# Patient Record
Sex: Male | Born: 1965 | Race: White | Hispanic: No | Marital: Single | Smoking: Never smoker
Health system: Southern US, Community
[De-identification: ages and names within clinical notes are randomized; demographics above are authoritative.]

## PROBLEM LIST (undated history)

## (undated) DIAGNOSIS — I10 Essential (primary) hypertension: Secondary | ICD-10-CM

## (undated) DIAGNOSIS — C801 Malignant (primary) neoplasm, unspecified: Secondary | ICD-10-CM

## (undated) HISTORY — PX: NO PAST SURGERIES: SHX2092

---

## 2019-11-03 ENCOUNTER — Other Ambulatory Visit: Payer: Self-pay | Admitting: Oncology

## 2019-11-03 ENCOUNTER — Other Ambulatory Visit: Payer: Self-pay

## 2019-11-03 ENCOUNTER — Emergency Department (HOSPITAL_COMMUNITY): Payer: Managed Care, Other (non HMO)

## 2019-11-03 ENCOUNTER — Encounter (HOSPITAL_COMMUNITY): Payer: Self-pay | Admitting: *Deleted

## 2019-11-03 ENCOUNTER — Inpatient Hospital Stay (HOSPITAL_COMMUNITY)
Admission: EM | Admit: 2019-11-03 | Discharge: 2019-11-06 | DRG: 543 | Disposition: A | Discharge status: Home / Self Care | Payer: Managed Care, Other (non HMO) | Attending: Internal Medicine | Admitting: Internal Medicine

## 2019-11-03 DIAGNOSIS — Z87891 Personal history of nicotine dependence: Secondary | ICD-10-CM | POA: Diagnosis not present

## 2019-11-03 DIAGNOSIS — D72829 Elevated white blood cell count, unspecified: Secondary | ICD-10-CM | POA: Diagnosis not present

## 2019-11-03 DIAGNOSIS — Z7982 Long term (current) use of aspirin: Secondary | ICD-10-CM | POA: Diagnosis not present

## 2019-11-03 DIAGNOSIS — D509 Iron deficiency anemia, unspecified: Secondary | ICD-10-CM | POA: Diagnosis present

## 2019-11-03 DIAGNOSIS — C7951 Secondary malignant neoplasm of bone: Secondary | ICD-10-CM | POA: Diagnosis not present

## 2019-11-03 DIAGNOSIS — I10 Essential (primary) hypertension: Secondary | ICD-10-CM | POA: Diagnosis present

## 2019-11-03 DIAGNOSIS — Z79899 Other long term (current) drug therapy: Secondary | ICD-10-CM | POA: Diagnosis not present

## 2019-11-03 DIAGNOSIS — Z20828 Contact with and (suspected) exposure to other viral communicable diseases: Secondary | ICD-10-CM | POA: Diagnosis present

## 2019-11-03 DIAGNOSIS — R509 Fever, unspecified: Secondary | ICD-10-CM | POA: Diagnosis not present

## 2019-11-03 DIAGNOSIS — E785 Hyperlipidemia, unspecified: Secondary | ICD-10-CM | POA: Diagnosis present

## 2019-11-03 DIAGNOSIS — C252 Malignant neoplasm of tail of pancreas: Secondary | ICD-10-CM | POA: Diagnosis present

## 2019-11-03 DIAGNOSIS — R000 Tachycardia, unspecified: Secondary | ICD-10-CM | POA: Diagnosis not present

## 2019-11-03 DIAGNOSIS — N179 Acute kidney failure, unspecified: Secondary | ICD-10-CM | POA: Diagnosis present

## 2019-11-03 DIAGNOSIS — Z808 Family history of malignant neoplasm of other organs or systems: Secondary | ICD-10-CM | POA: Diagnosis not present

## 2019-11-03 DIAGNOSIS — C787 Secondary malignant neoplasm of liver and intrahepatic bile duct: Secondary | ICD-10-CM | POA: Diagnosis present

## 2019-11-03 DIAGNOSIS — Z8249 Family history of ischemic heart disease and other diseases of the circulatory system: Secondary | ICD-10-CM | POA: Diagnosis not present

## 2019-11-03 DIAGNOSIS — K8689 Other specified diseases of pancreas: Secondary | ICD-10-CM | POA: Diagnosis present

## 2019-11-03 DIAGNOSIS — C799 Secondary malignant neoplasm of unspecified site: Secondary | ICD-10-CM

## 2019-11-03 DIAGNOSIS — M8448XA Pathological fracture, other site, initial encounter for fracture: Secondary | ICD-10-CM | POA: Diagnosis not present

## 2019-11-03 DIAGNOSIS — D649 Anemia, unspecified: Secondary | ICD-10-CM

## 2019-11-03 DIAGNOSIS — M8458XA Pathological fracture in neoplastic disease, other specified site, initial encounter for fracture: Principal | ICD-10-CM | POA: Diagnosis present

## 2019-11-03 DIAGNOSIS — S22080A Wedge compression fracture of T11-T12 vertebra, initial encounter for closed fracture: Secondary | ICD-10-CM

## 2019-11-03 DIAGNOSIS — C801 Malignant (primary) neoplasm, unspecified: Secondary | ICD-10-CM

## 2019-11-03 HISTORY — DX: Essential (primary) hypertension: I10

## 2019-11-03 LAB — CBC WITH DIFFERENTIAL/PLATELET
Abs Immature Granulocytes: 0.32 10*3/uL — ABNORMAL HIGH (ref 0.00–0.07)
Basophils Absolute: 0.1 10*3/uL (ref 0.0–0.1)
Basophils Relative: 0 %
Eosinophils Absolute: 0 10*3/uL (ref 0.0–0.5)
Eosinophils Relative: 0 %
HCT: 35.8 % — ABNORMAL LOW (ref 39.0–52.0)
Hemoglobin: 10.4 g/dL — ABNORMAL LOW (ref 13.0–17.0)
Immature Granulocytes: 1 %
Lymphocytes Relative: 5 %
Lymphs Abs: 1.6 10*3/uL (ref 0.7–4.0)
MCH: 20.4 pg — ABNORMAL LOW (ref 26.0–34.0)
MCHC: 29.1 g/dL — ABNORMAL LOW (ref 30.0–36.0)
MCV: 70.1 fL — ABNORMAL LOW (ref 80.0–100.0)
Monocytes Absolute: 1.9 10*3/uL — ABNORMAL HIGH (ref 0.1–1.0)
Monocytes Relative: 6 %
Neutro Abs: 30 10*3/uL — ABNORMAL HIGH (ref 1.7–7.7)
Neutrophils Relative %: 88 %
Platelets: 566 10*3/uL — ABNORMAL HIGH (ref 150–400)
RBC: 5.11 MIL/uL (ref 4.22–5.81)
RDW: 21.1 % — ABNORMAL HIGH (ref 11.5–15.5)
WBC: 34 10*3/uL — ABNORMAL HIGH (ref 4.0–10.5)
nRBC: 0 % (ref 0.0–0.2)

## 2019-11-03 LAB — COMPREHENSIVE METABOLIC PANEL
ALT: 32 U/L (ref 0–44)
ALT: 26 U/L (ref 0–44)
AST: 35 U/L (ref 15–41)
AST: 28 U/L (ref 15–41)
Albumin: 2.9 g/dL — ABNORMAL LOW (ref 3.5–5.0)
Albumin: 2.3 g/dL — ABNORMAL LOW (ref 3.5–5.0)
Alkaline Phosphatase: 179 U/L — ABNORMAL HIGH (ref 38–126)
Alkaline Phosphatase: 137 U/L — ABNORMAL HIGH (ref 38–126)
Anion gap: 11 (ref 5–15)
Anion gap: 8 (ref 5–15)
BUN: 46 mg/dL — ABNORMAL HIGH (ref 6–20)
BUN: 38 mg/dL — ABNORMAL HIGH (ref 6–20)
CO2: 27 mmol/L (ref 22–32)
CO2: 23 mmol/L (ref 22–32)
Calcium: 13.8 mg/dL (ref 8.9–10.3)
Calcium: 12.4 mg/dL — ABNORMAL HIGH (ref 8.9–10.3)
Chloride: 100 mmol/L (ref 98–111)
Chloride: 105 mmol/L (ref 98–111)
Creatinine, Ser: 1.59 mg/dL — ABNORMAL HIGH (ref 0.61–1.24)
Creatinine, Ser: 1.57 mg/dL — ABNORMAL HIGH (ref 0.61–1.24)
GFR calc Af Amer: 56 mL/min — ABNORMAL LOW (ref >60)
GFR calc Af Amer: 57 mL/min — ABNORMAL LOW (ref >60)
GFR calc non Af Amer: 48 mL/min — ABNORMAL LOW (ref >60)
GFR calc non Af Amer: 49 mL/min — ABNORMAL LOW (ref >60)
Glucose, Bld: 153 mg/dL — ABNORMAL HIGH (ref 70–99)
Glucose, Bld: 246 mg/dL — ABNORMAL HIGH (ref 70–99)
Potassium: 4.2 mmol/L (ref 3.5–5.1)
Potassium: 4 mmol/L (ref 3.5–5.1)
Sodium: 138 mmol/L (ref 135–145)
Sodium: 136 mmol/L (ref 135–145)
Total Bilirubin: 0.6 mg/dL (ref 0.3–1.2)
Total Bilirubin: 0.7 mg/dL (ref 0.3–1.2)
Total Protein: 7.1 g/dL (ref 6.5–8.1)
Total Protein: 5.3 g/dL — ABNORMAL LOW (ref 6.5–8.1)

## 2019-11-03 LAB — URINALYSIS, ROUTINE W REFLEX MICROSCOPIC
Bilirubin Urine: NEGATIVE
Glucose, UA: NEGATIVE mg/dL
Hgb urine dipstick: NEGATIVE
Ketones, ur: NEGATIVE mg/dL
Leukocytes,Ua: NEGATIVE
Nitrite: NEGATIVE
Protein, ur: NEGATIVE mg/dL
Specific Gravity, Urine: 1.011 (ref 1.005–1.030)
pH: 7 (ref 5.0–8.0)

## 2019-11-03 LAB — T4, FREE: Free T4: 1.05 ng/dL (ref 0.61–1.12)

## 2019-11-03 LAB — SARS CORONAVIRUS 2 (TAT 6-24 HRS): SARS Coronavirus 2: NEGATIVE

## 2019-11-03 LAB — TSH: TSH: 2.281 u[IU]/mL (ref 0.350–4.500)

## 2019-11-03 LAB — HIV ANTIBODY (ROUTINE TESTING W REFLEX): HIV Screen 4th Generation wRfx: NONREACTIVE

## 2019-11-03 MED ORDER — SODIUM CHLORIDE 0.9 % IV BOLUS: 1000.0000 mL | Freq: Once | INTRAVENOUS | Status: DC

## 2019-11-03 MED ORDER — ENOXAPARIN SODIUM 40 MG/0.4ML ~~LOC~~ SOLN
40.0000 mg | SUBCUTANEOUS | Status: DC
  Administered 2019-11-03: 40 mg via SUBCUTANEOUS
  Filled 2019-11-03: qty 0.4

## 2019-11-03 MED ORDER — SODIUM CHLORIDE 0.9 % IV BOLUS
1000.0000 mL | Freq: Once | INTRAVENOUS | Status: AC
  Administered 2019-11-03: 1000 mL via INTRAVENOUS

## 2019-11-03 MED ORDER — HYDROCODONE-ACETAMINOPHEN 5-325 MG PO TABS
1.0000 | ORAL_TABLET | Freq: Four times a day (QID) | ORAL | Status: DC | PRN
  Administered 2019-11-04 – 2019-11-05 (×2): 1 via ORAL
  Filled 2019-11-03 (×2): qty 1

## 2019-11-03 MED ORDER — ONDANSETRON HCL 4 MG/2ML IJ SOLN
4.0000 mg | Freq: Four times a day (QID) | INTRAMUSCULAR | Status: DC | PRN
  Administered 2019-11-03: 4 mg via INTRAVENOUS
  Filled 2019-11-03 (×2): qty 2

## 2019-11-03 MED ORDER — SODIUM CHLORIDE 0.9 % IV SOLN: INTRAVENOUS | Status: DC

## 2019-11-03 MED ORDER — ASPIRIN EC 81 MG PO TBEC
81.0000 mg | DELAYED_RELEASE_TABLET | Freq: Every day | ORAL | Status: DC
  Administered 2019-11-03 – 2019-11-06 (×4): 81 mg via ORAL
  Filled 2019-11-03 (×4): qty 1

## 2019-11-03 MED ORDER — FENOFIBRATE 54 MG PO TABS
54.0000 mg | ORAL_TABLET | Freq: Every day | ORAL | Status: DC
  Administered 2019-11-03 – 2019-11-06 (×4): 54 mg via ORAL
  Filled 2019-11-03 (×5): qty 1

## 2019-11-03 MED ORDER — IOHEXOL 300 MG/ML  SOLN
80.0000 mL | Freq: Once | INTRAMUSCULAR | Status: AC | PRN
  Administered 2019-11-03: 14:00:00 80 mL via INTRAVENOUS

## 2019-11-03 MED ORDER — ENOXAPARIN SODIUM 40 MG/0.4ML ~~LOC~~ SOLN: 40.0000 mg | SUBCUTANEOUS | Status: DC

## 2019-11-03 MED ORDER — SODIUM CHLORIDE 0.9 % IV SOLN: INTRAVENOUS | Status: AC

## 2019-11-03 MED ORDER — ZOLEDRONIC ACID 4 MG/5ML IV CONC
3.5000 mg | Freq: Once | INTRAVENOUS | Status: AC
  Administered 2019-11-03: 3.5 mg via INTRAVENOUS
  Filled 2019-11-03: qty 4.38

## 2019-11-03 MED ORDER — MORPHINE SULFATE (PF) 4 MG/ML IV SOLN
4.0000 mg | INTRAVENOUS | Status: DC | PRN
  Administered 2019-11-03: 4 mg via INTRAVENOUS
  Filled 2019-11-03: qty 1

## 2019-11-03 MED ORDER — PRAVASTATIN SODIUM 40 MG PO TABS
40.0000 mg | ORAL_TABLET | Freq: Every day | ORAL | Status: DC
  Administered 2019-11-03 – 2019-11-06 (×4): 40 mg via ORAL
  Filled 2019-11-03 (×4): qty 1

## 2019-11-03 NOTE — ED Notes (Signed)
Called lab to check on parathyroid hormone that is not in process, although collected at 0826. Kenneth Khan in lab advises she will take care of it.

## 2019-11-03 NOTE — ED Notes (Signed)
Patient returned from CT

## 2019-11-03 NOTE — ED Triage Notes (Signed)
The pt was seen by his doctor yesterday a for back pain andhe had labwork drawn.  At 0500am today he was called at home and was told he had abnormal labs and to come to the ed.  Alert oriented skin warm and dry  No distress he took a vicodin at 0115 today  Pain at present is minimal

## 2019-11-03 NOTE — H&P (Signed)
Date: 11/03/2019               Patient Name:  Kenneth Khan. MRN: [id number]  DOB: Jul 10, 1966 Age / Sex: 53 y.o., male   PCP: Nicholes Rough, PA-C         Medical Service: Internal Medicine Teaching Service         Attending Physician: Dr. Rebeca Alert, Raynaldo Opitz, MD    First Contact: Madilyn Fireman, MD, Lyndee Leo Pager: CL [phone])  Second Contact: Eileen Stanford, MD, Obed Pager: OA [phone])       After Hours (After 5p/  First Contact Pager: [phone]  weekends / holidays): Second Contact Pager: [phone]   Chief Complaint: lower back pain   History of Present Illness: Kenneth Khan is a  53 y.o. male w/ PMHx of hypertension and hyperlipidemia presenting with right lower back pain for few weeks duration. He initially noted the pain after lifting his garage door when the spring broke and had initially treated it with tylenol and prednisone that was prescribed by an urgent care physician. He reports no significant relief and his back pain progressively worsened. He notes that it is exacerbated with standing, movement and sitting and progressively migrated along the belt line to the right lower back. He went to his PCP yesterday for evaluation of his pain and his annual check up. He was given Norco and a muscle relaxant which provided mild relief of his back pain. Labs were drawn by the PCP's office and he was noted to have abnormal white count and calcium and instructed to present to the hospital.  In the last couple of months, he has experienced fatigue which is usually worse at the end of the day. He states that he has lost about 20 pounds this year which he attributes to the [place] pandemic as he has made some dietary changes. He denies fevers, chills, night sweats, abdominal pain, diarrhea, constipation, chest pain, shortness of breath, myalgias, headaches, or vision abnormalities.   ED course:  Patient presented on recommendation of PCP for concerns of abnormal calcium levels and elevated  white count. Labs significant for Calcium 13.8, Alkaline phosphatase 179 and Cr 1.59. CBC with anemia and leukocytosis (WBC 34K). X-ray spine notable for T12 compression fracture. CT thoracic and lumbar spine with T1 and T12 lesions and concerns for pancreatic/ehpatic metastatic disease. Heme/onc consulted who recommended for CT chest/abdomen/pelvis and additional labs. Patient admitted to internal medicine for further evaluation and management.     Meds:  Current Meds  Medication Sig  . aspirin EC 81 MG tablet Take 81 mg by mouth daily.  . fenofibrate 54 MG tablet Take 54 mg by mouth daily.  Marland Kitchen HYDROcodone-acetaminophen (NORCO/VICODIN) 5-325 MG tablet Take 1 tablet by mouth every 6 (six) hours as needed for moderate pain.  Marland Kitchen lisinopril-hydrochlorothiazide (ZESTORETIC) 20-12.5 MG tablet Take 1 tablet by mouth daily.  . methocarbamol (ROBAXIN) 750 MG tablet Take 750 mg by mouth 3 (three) times daily.  . pravastatin (PRAVACHOL) 40 MG tablet Take 40 mg by mouth daily.  . predniSONE (STERAPRED UNI-PAK 21 TAB) 10 MG (21) TBPK tablet Take 10 mg by mouth See admin instructions. Started on 10-30-19 for 7 day supply. Pt's on  day 5 of therapy.     Allergies: Allergies as of 11/03/2019  . (No Known Allergies)   Past Medical History:  Diagnosis Date  . Hypertension     Family History:  Father: deceased from brain cancer; history of hypertension, hyperlipidemia and  coronary artery disease Mother: alive, 20+ years old, healthy No siblings   Social History:  Social History   Tobacco Use  . Smoking status: Never Smoker  . Smokeless tobacco: Never Used  Substance Use Topics  . Alcohol use: Yes  . 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 He lives with his fiance and her two children which are there during the weekend. He reports a 1.54yr history of smoking less than half pack per day and quit in January. He notes that he occasionally drinks wine (once every  couple weeks). He denies any illicit drug use and does not take any over the counter supplements.   Review of Systems: A complete ROS was negative except as per HPI.   Physical Exam: Blood pressure 119/62, pulse 88, temperature 98 F (36.7 C), temperature source Oral, resp. rate (!) 21, height 5\' 10"  (1.778 m), weight 83.9 kg, SpO2 99 %. Physical Exam Vitals and nursing note reviewed.  Constitutional:      General: He is not in acute distress.    Appearance: Normal appearance. He is not ill-appearing or diaphoretic.  HENT:     Head: Normocephalic and atraumatic.     Mouth/Throat:     Mouth: Mucous membranes are moist.     Pharynx: Oropharynx is clear. No oropharyngeal exudate or posterior oropharyngeal erythema.  Eyes:     General: No scleral icterus.    Extraocular Movements: Extraocular movements intact.     Comments: Pale conjunctivae  Cardiovascular:     Rate and Rhythm: Regular rhythm. Tachycardia present.     Pulses: Normal pulses.     Heart sounds: Normal heart sounds. No murmur. No friction rub. No gallop.   Pulmonary:     Effort: Pulmonary effort is normal. No respiratory distress.     Breath sounds: Normal breath sounds. No stridor. No wheezing, rhonchi or rales.  Chest:     Chest wall: No tenderness.  Abdominal:     General: Bowel sounds are normal. There is no distension.     Palpations: Abdomen is soft. There is no mass.     Tenderness: There is no abdominal tenderness. There is no guarding.  Musculoskeletal:        General: No swelling, deformity or signs of injury. Normal range of motion.     Cervical back: Normal range of motion and neck supple.  Skin:    General: Skin is warm and dry.     Capillary Refill: Capillary refill takes less than 2 seconds.     Coloration: Skin is not jaundiced.     Findings: No rash.  Neurological:     General: No focal deficit present.     Mental Status: He is alert and oriented to person, place, and time. Mental status is at  baseline.  Psychiatric:        Mood and Affect: Mood normal.        Behavior: Behavior normal.        Thought Content: Thought content normal.        Judgment: Judgment normal.    CBC    Component Value Date/Time   WBC 34.0 (H) 11/03/2019 0705   RBC 5.11 11/03/2019 0705   HGB 10.4 (L) 11/03/2019 0705   HCT 35.8 (L) 11/03/2019 0705   PLT 566 (H) 11/03/2019 0705   MCV 70.1 (L) 11/03/2019 0705   MCH 20.4 (L) 11/03/2019 0705   MCHC 29.1 (L) 11/03/2019 0705   RDW  21.1 (H) 11/03/2019 0705   LYMPHSABS 1.6 11/03/2019 0705   MONOABS 1.9 (H) 11/03/2019 0705   EOSABS 0.0 11/03/2019 0705   BASOSABS 0.1 11/03/2019 0705   CMP     Component Value Date/Time   NA 138 11/03/2019 0705   K 4.2 11/03/2019 0705   CL 100 11/03/2019 0705   CO2 27 11/03/2019 0705   GLUCOSE 153 (H) 11/03/2019 0705   BUN 46 (H) 11/03/2019 0705   CREATININE 1.59 (H) 11/03/2019 0705   CALCIUM 13.8 (HH) 11/03/2019 0705   PROT 7.1 11/03/2019 0705   ALBUMIN 2.9 (L) 11/03/2019 0705   AST 35 11/03/2019 0705   ALT 32 11/03/2019 0705   ALKPHOS 179 (H) 11/03/2019 0705   BILITOT 0.6 11/03/2019 0705   GFRNONAA 48 (L) 11/03/2019 0705   GFRAA 56 (L) 11/03/2019 0705    EKG: personally reviewed my interpretation is NSR, RSR' in V2, one early PVC noted; QTc 436  CXR: personally reviewed my interpretation is no acute cardiopulmonary findings noted on CXR.   X-RAY LUMBAR SPINE:  IMPRESSION: Age-indeterminate T12 superior endplate fracture with mild height loss. Considering the patient's calcium levels, suggests CT or MRI to exclude an underlying lesion.  CT THORACIC AND LUMBAR SPINE WO CONTRAST:  IMPRESSION: 1. Metastatic pattern with infiltrative lesions in the T1 body, T12 body, and left posterior fifth rib. Pathologic fracture at T12 with moderate central height loss and probable ventral epidural tumor extension. 2. Partially covered abdomen shows pancreatic tail mass and extensive hepatic metastatic  disease.  CT CHEST/ABDOMEN/PELVIS W CONTRAST:  IMPRESSION: 1. 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 Innumerable hepatic masses, mildly enlarged abdominal lymph nodes and lytic lesions involving T1, bilateral ribs, manubrium, RIGHT iliac bone and T12 noted compatible with diffuse metastatic disease. 50% pathologic fracture of T12. 2. Cholelithiasis without CT evidence of acute cholecystitis.  Assessment & Plan by Problem:  Kenneth Khan is a 53 yr old male with history of HTN and HLD presenting with few weeks of lower back pain and hypercalcemia found to have pancreatic tail mass, hepatic masses and diffuse metastatic disease on imaging.   Compression fracture of T12:  Metastatic disease:  Patient with right lower back pain for several weeks following mechanical injury evaluated by PCP yesterday and noted to have significant leukocytosis and hypercalcemia. Imaging significant for compression fracture of T12 and lytic lesion in T1. CT Chest/Abdomen/Pelvis significant for a pancreatic tail mass, multiple hepatic masses, lytic lesions involving T1, bilateral ribs, sternum, right iliac bone and T12 consistent with diffuse metastatic disease. Tumor markers in process. Heme/onc on board, appreciate their recommendations.  - US guided liver biopsy  - NM bone scan  - Norco 5-325mg  q6h prn and morphine 4mg  q6h prn - INR/PT/PTT - LDH and PSA - f/u AFP, CA 19-9, CEA - follow up heme/onc recommendations   Hypercalcemia:  Ca 13.8 (14.8 on PCP's labs yesterday). This is likely secondary to his malignancy. He denies any abdominal pain, nausea/vomiting, muscle weakness or bone pain at the moment.  - NS bolus + NS 100 cc/hr  - IV zolendronic acid 3.5mg   - CMP daily  Acute kidney injury:  sCr 1.59 and GFR 48. Baseline sCr ~1.1. Suspect prerenal in setting of hypercalcemia and also ACEi-HCTZ use.  - IVF - Continue to monitor - Avoid nephrotoxic agents   Acute  microcytic anemia:  Hb 10.4 (Baseline 15.7) MCV 70.1. Iron deficiency anemia vs anemia of chronic disease. He has not had a  colonoscopy. Patient denies any hematochezia or changes in stools. He does note increasing fatigue over the past several months with 20lb weight loss and is noted to have pale conjunctivae on examination.  - Iron panel - FOBT  - CBC daily  Acute leukocytosis:  WBC 34. This is likely due to patient's malignancy and in setting of recent steroid use. Patient is afebrile and no acute concerns for infectious etiology at this time.  - CBC daily   History of hypertension: Patient is on lisinopril-HCTZ 20-12.5mg  daily. Normotensive on admission.  - Holding in setting of AKI.   History of hyperlipidemia: - Continue pravastatin 40mg  qd  - Continue fenofibrate 54mg  qd  FEN: Low sodium diet, NS bolus + 100cc/hr Code: FULL  DVT Prophylaxis: SCDs  Dispo: Admit patient to Inpatient with expected length of stay greater than 2 midnights.  Signed: Harvie Heck, MD  Internal Medicine, PGY-1 11/03/2019, 2:09 PM  Pager: [phone]

## 2019-11-03 NOTE — Consult Note (Signed)
[place] [phone]   Fax:([phone]) [place] NOTE  Patient Care Team: Randa Evens as PCP - General (Physician Assistant)  Hematological/Oncological History # Imaging Concerning for Metastatic Disease  1) 11/03/2019: presented to [name] after seeing his PCP the day prior. Noted to have lab abnormalities. CT Thoracic Spine/Lumbar shows lesion to T1 and compression fracture of T12. CT C/A/P pending. Noted to have microcytic anemia and hypercalcemia to 13.8.   CHIEF COMPLAINTS/PURPOSE OF CONSULTATION:  "Lower Back pain "  HISTORY OF PRESENTING ILLNESS:  Kenneth Khan. 53 y.o. male with medical history significant for HTN and HLD who presented to his PCP with back pain and was found to have marked leukocytosis and hypercalcemia.   On exam today Kenneth Khan notes he was in his normal state of health until a few weeks ago when the spring snapped on his garage door.  He lifted the garage door back into place, noting that the garage door weighs about 340 pounds.  At that time he noticed a twinge of pain in his lower back, which progressively worsened.  When it finally became painful to walk and painful to put his feet on the floor he presented to his PCP for further evaluation.  His PCP ordered baseline labs and started him on a steroid pack.  When the PCP saw the abnormalities in his labs he was referred to [facility] emergency department for further evaluation.  In the emergency department CT imaging of the thoracic spine and lumbar spine was performed revealing an infiltrative lesion of the T1 body as well as compression fracture of T12.  Oncology was consulted for further evaluation and management.  On further discussion with Kenneth Khan he notes that he is lost approximate 20 pounds since the onset of the pandemic.  He attributed this to eating out less as well as a decrease in intake of sodas.  He reports that his energy level has been  baseline, but he has maybe had some increase in fatigue in the evenings.  He has not noticed any change in the color of his skin, denying any jaundice, but reporting that his fiance notes that he appears more pale than usual.  He is otherwise asymptomatic.  He denies any fevers, chills, sweats, nausea, vomiting or diarrhea.  He reports no positive patient or other change in bowel habits.  A full 10 point ROS is listed below.  On review of his social history he is a former smoker who quit earlier this year after only smoking for 2 years.  He reports smoking 1/2 pack/day.  His alcohol consumption is limited to a glass of wine every couple weeks, but he denies ever having heavier alcohol consumption.  He has no risk factor for otitis B or C.  Of note his family history significant for his father passing away of non-Hodgkin's lymphoma with metastasis to the brain.  MEDICAL HISTORY:  Past Medical History:  Diagnosis Date  . Hypertension     SURGICAL HISTORY: History reviewed. No pertinent surgical history.  SOCIAL HISTORY: Social History   Socioeconomic History  . Marital status: Single    Spouse name: Not on file  . Number of children: Not on file  . Years of education: Not on file  . Highest education level: Not on file  Occupational History  . Not on file  Tobacco Use  . Smoking status: Never Smoker  . Smokeless tobacco: Never Used  Substance and Sexual  Activity  . Alcohol use: Yes  . Drug use: Never  . Sexual activity: Not on file  Other Topics Concern  . Not on file  Social History Narrative  . Not on file   Social Determinants of Health   Financial Resource Strain:   . Difficulty of Paying Living Expenses: Not on file  Food Insecurity:   . Worried About Charity fundraiser in the Last Year: Not on file  . Ran Out of Food in the Last Year: Not on file  Transportation Needs:   . Lack of Transportation (Medical): Not on file  . Lack of Transportation (Non-Medical): Not on  file  Physical Activity:   . Days of Exercise per Week: Not on file  . Minutes of Exercise per Session: Not on file  Stress:   . Feeling of Stress : Not on file  Social Connections:   . Frequency of Communication with Friends and Family: Not on file  . Frequency of Social Gatherings with Friends and Family: Not on file  . Attends Religious Services: Not on file  . Active Member of Clubs or Organizations: Not on file  . Attends Archivist Meetings: Not on file  . Marital Status: Not on file  Intimate Partner Violence:   . Fear of Current or Ex-Partner: Not on file  . Emotionally Abused: Not on file  . Physically Abused: Not on file  . Sexually Abused: Not on file    FAMILY HISTORY: Father: died of Non-Hodgkins Lymphoma with brain mets. HTN, HLD. Mother: living, 71 years old and healthy Only child No biological children  ALLERGIES:  has No Known Allergies.  MEDICATIONS:  Current Facility-Administered Medications  Medication Dose Route Frequency Provider Last Rate Last Admin  . morphine 4 MG/ML injection 4 mg  4 mg Intravenous Q4H PRN Suella Broad A, PA-C   4 mg at 11/03/19 1136  . ondansetron (ZOFRAN) injection 4 mg  4 mg Intravenous Q6H PRN Tacy Learn, PA-C   4 mg at 11/03/19 1136   Current Outpatient Medications  Medication Sig Dispense Refill  . aspirin EC 81 MG tablet Take 81 mg by mouth daily.    . fenofibrate 54 MG tablet Take 54 mg by mouth daily.    Marland Kitchen HYDROcodone-acetaminophen (NORCO/VICODIN) 5-325 MG tablet Take 1 tablet by mouth every 6 (six) hours as needed for moderate pain.    Marland Kitchen lisinopril-hydrochlorothiazide (ZESTORETIC) 20-12.5 MG tablet Take 1 tablet by mouth daily.    . methocarbamol (ROBAXIN) 750 MG tablet Take 750 mg by mouth 3 (three) times daily.    . pravastatin (PRAVACHOL) 40 MG tablet Take 40 mg by mouth daily.    . predniSONE (STERAPRED UNI-PAK 21 TAB) 10 MG (21) TBPK tablet Take 10 mg by mouth See admin instructions. Started on  10-30-19 for 7 day supply. Pt's on  day 5 of therapy.      REVIEW OF SYSTEMS:   Constitutional: ( - ) fevers, ( - )  chills , ( - ) night sweats (+) weight loss Eyes: ( - ) blurriness of vision, ( - ) double vision, ( - ) watery eyes Ears, nose, mouth, throat, and face: ( - ) mucositis, ( - ) sore throat Respiratory: ( - ) cough, ( - ) dyspnea, ( - ) wheezes Cardiovascular: ( - ) palpitation, ( - ) chest discomfort, ( - ) lower extremity swelling Gastrointestinal:  ( - ) nausea, ( - ) heartburn, ( - ) change in bowel  habits Skin: ( - ) abnormal skin rashes Lymphatics: ( - ) new lymphadenopathy, ( - ) easy bruising Neurological: ( - ) numbness, ( - ) tingling, ( - ) new weaknesses Behavioral/Psych: ( - ) mood change, ( - ) new changes  All other systems were reviewed with the patient and are negative.  PHYSICAL EXAMINATION: ECOG PERFORMANCE STATUS: 1 - Symptomatic but completely ambulatory  Vitals:   11/03/19 1100 11/03/19 1130  BP: 120/86 133/81  Pulse: (!) 101 95  Resp: 20 16  Temp:    SpO2: 97% 97%   Filed Weights   11/03/19 0707  Weight: 185 lb (83.9 kg)    GENERAL: well appearing middle aged male in NAD  SKIN: skin color, texture, turgor are normal, no rashes or significant lesions EYES: conjunctiva are pink and non-injected, sclera clear LUNGS: clear to auscultation and percussion with normal breathing effort HEART: regular rate & rhythm and no murmurs and no lower extremity edema ABDOMEN: soft, non-tender, non-distended, normal bowel sounds Musculoskeletal: no cyanosis of digits and no clubbing  PSYCH: alert & oriented x 3, fluent speech NEURO: no focal motor/sensory deficits  LABORATORY DATA:  I have reviewed the data as listed Recent Results (from the past 2160 hour(s))  Comprehensive metabolic panel     Status: Abnormal   Collection Time: 11/03/19  7:05 AM  Result Value Ref Range   Sodium 138 135 - 145 mmol/L   Potassium 4.2 3.5 - 5.1 mmol/L   Chloride 100  98 - 111 mmol/L   CO2 27 22 - 32 mmol/L   Glucose, Bld 153 (H) 70 - 99 mg/dL   BUN 46 (H) 6 - 20 mg/dL   Creatinine, Ser 1.59 (H) 0.61 - 1.24 mg/dL   Calcium 13.8 (HH) 8.9 - 10.3 mg/dL    Comment: CRITICAL RESULT CALLED TO, READ BACK BY AND VERIFIED WITH: S.BERTRANS RN @ [phone] 11/03/2019 BY C.EDENS    Total Protein 7.1 6.5 - 8.1 g/dL   Albumin 2.9 (L) 3.5 - 5.0 g/dL   AST 35 15 - 41 U/L   ALT 32 0 - 44 U/L   Alkaline Phosphatase 179 (H) 38 - 126 U/L   Total Bilirubin 0.6 0.3 - 1.2 mg/dL   GFR calc non Af Amer 48 (L) >60 mL/min   GFR calc Af Amer 56 (L) >60 mL/min   Anion gap 11 5 - 15    Comment: Performed at [facility], [address] [place], [place] [address]  CBC with Differential     Status: Abnormal   Collection Time: 11/03/19  7:05 AM  Result Value Ref Range   WBC 34.0 (H) 4.0 - 10.5 K/uL   RBC 5.11 4.22 - 5.81 MIL/uL   Hemoglobin 10.4 (L) 13.0 - 17.0 g/dL   HCT 35.8 (L) 39.0 - 52.0 %   MCV 70.1 (L) 80.0 - 100.0 fL   MCH 20.4 (L) 26.0 - 34.0 pg   MCHC 29.1 (L) 30.0 - 36.0 g/dL   RDW 21.1 (H) 11.5 - 15.5 %   Platelets 566 (H) 150 - 400 K/uL   nRBC 0.0 0.0 - 0.2 %   Neutrophils Relative % 88 %   Neutro Abs 30.0 (H) 1.7 - 7.7 K/uL   Lymphocytes Relative 5 %   Lymphs Abs 1.6 0.7 - 4.0 K/uL   Monocytes Relative 6 %   Monocytes Absolute 1.9 (H) 0.1 - 1.0 K/uL   Eosinophils Relative 0 %   Eosinophils Absolute 0.0 0.0 -  0.5 K/uL   Basophils Relative 0 %   Basophils Absolute 0.1 0.0 - 0.1 K/uL   Immature Granulocytes 1 %   Abs Immature Granulocytes 0.32 (H) 0.00 - 0.07 K/uL    Comment: Performed at [facility], [place] [address], [place], [place] [address]  Urinalysis, Routine w reflex microscopic     Status: Abnormal   Collection Time: 11/03/19  8:19 AM  Result Value Ref Range   Color, Urine STRAW (A) YELLOW   APPearance CLEAR CLEAR   Specific Gravity, Urine 1.011 1.005 - 1.030   pH 7.0 5.0 - 8.0   Glucose, UA NEGATIVE NEGATIVE mg/dL   Hgb urine  dipstick NEGATIVE NEGATIVE   Bilirubin Urine NEGATIVE NEGATIVE   Ketones, ur NEGATIVE NEGATIVE mg/dL   Protein, ur NEGATIVE NEGATIVE mg/dL   Nitrite NEGATIVE NEGATIVE   Leukocytes,Ua NEGATIVE NEGATIVE    Comment: Performed at [facility] [address], [place], [place] [address]  T4, free     Status: None   Collection Time: 11/03/19  8:26 AM  Result Value Ref Range   Free T4 1.05 0.61 - 1.12 ng/dL    Comment: (NOTE) Biotin ingestion may interfere with free T4 tests. If the results are inconsistent with the TSH level, previous test results, or the clinical presentation, then consider biotin interference. If needed, order repeat testing after stopping biotin. Performed at [facility], [place] [address] [place], [place] [address]   TSH     Status: None   Collection Time: 11/03/19  8:26 AM  Result Value Ref Range   TSH 2.281 0.350 - 4.500 uIU/mL    Comment: Performed by a 3rd Generation assay with a functional sensitivity of <=0.01 uIU/mL. Performed at [facility], [place] [address] [place], [place] [address]      PATHOLOGY: Pending biopsy.   RADIOGRAPHIC STUDIES: I have personally reviewed the radiological images as listed and agreed with the findings in the report: lytic lesion of the T1 and compression fracture of T12.   DG Chest 2 View  Result Date: 11/03/2019 CLINICAL DATA:  Abnormal labs EXAM: CHEST - 2 VIEW COMPARISON:  None. FINDINGS: Normal heart size and mediastinal contours. No acute infiltrate or edema. No effusion or pneumothorax. No acute osseous findings. Artifact from EKG leads IMPRESSION: Negative chest. Electronically Signed   By: Monte Fantasia M.D.   On: 11/03/2019 09:14   DG Lumbar Spine Complete  Result Date: 11/03/2019 CLINICAL DATA:  Abnormal labs.  Back pain EXAM: LUMBAR SPINE - COMPLETE 4+ VIEW COMPARISON:  None. FINDINGS: T12 superior endplate fracture with mild height loss, age-indeterminate. No clear erosion, although question  whether the right pedicle at T12 is more lucent than the others. Spondylitic spurring greatest at L2-3 where there is mild disc narrowing. No evidence of fracture or bone lesion. IMPRESSION: Age-indeterminate T12 superior endplate fracture with mild height loss. Considering the patient's calcium levels, suggests CT or MRI to exclude an underlying lesion. Electronically Signed   By: Monte Fantasia M.D.   On: 11/03/2019 08:31   CT Thoracic Spine Wo Contrast  Result Date: 11/03/2019 CLINICAL DATA:  Concern for pathologic fracture. EXAM: CT THORACIC AND LUMBAR SPINE WITHOUT CONTRAST TECHNIQUE: Multidetector CT imaging of the thoracic and lumbar spine was performed without contrast. Multiplanar CT image reconstructions were also generated. COMPARISON:  None. FINDINGS: CT THORACIC SPINE FINDINGS Alignment: Normal Vertebrae: Extensive infiltrative lesion within the T1 body without pathologic fracture. No visible extraosseous tumor extension. Left  fifth posterior rib infiltrative lesion with probable minimal subpleural extension. T12 extensive infiltrative lesion within the body, right eccentric, with comminuted body fracture that appears recent. Central height loss measures up to 60%. No significant retropulsion. Suspect there is posterior ventral epidural tumor extension at this level. Paraspinal and other soft tissues: Partially covered liver shows multiple low-density masses. There is also a partially covered mass at the left pancreatic tail. Disc levels: No significant degenerative changes CT LUMBAR SPINE FINDINGS Segmentation: 5 lumbar type vertebrae Alignment: Normal Vertebrae: Equivocal for early infiltrative lesion within the right posterior L5 body. No fracture. Paraspinal and other soft tissues: As above Disc levels: No significant degenerative changes or visible impingement IMPRESSION: 1. Metastatic pattern with infiltrative lesions in the T1 body, T12 body, and left posterior fifth rib. Pathologic  fracture at T12 with moderate central height loss and probable ventral epidural tumor extension. 2. Partially covered abdomen shows pancreatic tail mass and extensive hepatic metastatic disease. Electronically Signed   By: Monte Fantasia M.D.   On: 11/03/2019 10:08   CT Lumbar Spine Wo Contrast  Result Date: 11/03/2019 CLINICAL DATA:  Concern for pathologic fracture. EXAM: CT THORACIC AND LUMBAR SPINE WITHOUT CONTRAST TECHNIQUE: Multidetector CT imaging of the thoracic and lumbar spine was performed without contrast. Multiplanar CT image reconstructions were also generated. COMPARISON:  None. FINDINGS: CT THORACIC SPINE FINDINGS Alignment: Normal Vertebrae: Extensive infiltrative lesion within the T1 body without pathologic fracture. No visible extraosseous tumor extension. Left fifth posterior rib infiltrative lesion with probable minimal subpleural extension. T12 extensive infiltrative lesion within the body, right eccentric, with comminuted body fracture that appears recent. Central height loss measures up to 60%. No significant retropulsion. Suspect there is posterior ventral epidural tumor extension at this level. Paraspinal and other soft tissues: Partially covered liver shows multiple low-density masses. There is also a partially covered mass at the left pancreatic tail. Disc levels: No significant degenerative changes CT LUMBAR SPINE FINDINGS Segmentation: 5 lumbar type vertebrae Alignment: Normal Vertebrae: Equivocal for early infiltrative lesion within the right posterior L5 body. No fracture. Paraspinal and other soft tissues: As above Disc levels: No significant degenerative changes or visible impingement IMPRESSION: 1. Metastatic pattern with infiltrative lesions in the T1 body, T12 body, and left posterior fifth rib. Pathologic fracture at T12 with moderate central height loss and probable ventral epidural tumor extension. 2. Partially covered abdomen shows pancreatic tail mass and extensive  hepatic metastatic disease. Electronically Signed   By: Monte Fantasia M.D.   On: 11/03/2019 10:08    ASSESSMENT & PLAN Kenneth Khan. 53 y.o. male with medical history significant for HTN and HLD who presented to his PCP with back pain and was found to have marked leukocytosis and hypercalcemia.  After this he was referred to the emergency department where a CT scan of the thoracic and lumbar spine revealed lytic lesions in T1 and a compression fracture of T12.  Also noted during this imaging study were findings concerning for masses of the pancreas as well as liver.  At this time the patient's findings are most concerning for metastatic disease.  His hypercalcemia is most likely secondary to lytic bone lesions.  Additionally his leukocytosis can be explained by his recent steroid use as well as a possible response to his current malignancy.  He has a microcytic anemia most consistent with iron deficiency anemia, possibly due to a GI bleed.  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  Given his  lab abnormalities I recommend he be admitted to the general medicine service for treatment of his hypercalcemia and additional work-up of his malignancy.  #Imaging Concerning for Metastatic Disease --please order a CT C/A/P for staging and to get better imaging of the pancreatic/liver lesions. Pending these results can consider a NM bone scan for further evaluation.  --recommend patient be admitted for control of the hypercalcemia and initial workup of this likely malignancy.  --please place consult to IR for biopsy of one of the liver lesions if feasible. In the event this is not able to be performed timely during his admission I would strongly recommend this be arranged in the outpatient setting prior to d/c.  --tumor markers including AFP, CA 19-9, and CEA are in process. --additionally please order LDH, PSA, and PT/INR --oncology service will continue to follow.    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 Fortunately patient is asymptomatic at this time --recommend administering IV zometa 4g x 1 dose while inpatient. --recommend admission for fluids and treatment of hypercalcemia by primary general medicine team.   #Microcytic Anemia, Acute #Thrombocytosis, Acute --findings are consistent with an iron deficiency anemia, most probably from GI blood loss --please order an Iron, TIBC, and Ferritin  --consider GI evaluation for evaluation of source of bleeding, pending results of CT scan --can consider iron supplementation based on results of above labs.  #Leukocytosis, Acute --likely 2/2 to this patients malignancy, with some contribution from recent steroid taper. --neutrophilic predominance with normal differential --no clear infectious symptoms at this time --continue to monitor  Orders Placed This Encounter  Procedures  . SARS CORONAVIRUS 2 (TAT 6-24 HRS) Nasopharyngeal Nasopharyngeal Swab    Standing Status:   Standing    Number of Occurrences:   1    Order Specific Question:   Is this test for diagnosis or screening    Answer:   Screening    Order Specific Question:   Symptomatic for COVID-19 as defined by CDC    Answer:   No    Order Specific Question:   Hospitalized for COVID-19    Answer:   No    Order Specific Question:   Admitted to ICU for COVID-19    Answer:   No    Order Specific Question:   Previously tested for COVID-19    Answer:   No    Order Specific Question:   Resident in a congregate (group) care setting    Answer:   No    Order Specific Question:   Employed in healthcare setting    Answer:   No  . DG Lumbar Spine Complete    Standing Status:   Standing    Number of Occurrences:   1    Order Specific Question:   Reason for Exam (SYMPTOM  OR DIAGNOSIS REQUIRED)    Answer:   low back pain  . DG Chest 2 View    Standing Status:   Standing    Number of Occurrences:   1    Order Specific Question:    Reason for Exam (SYMPTOM  OR DIAGNOSIS REQUIRED)    Answer:   abnormal labs  . CT Thoracic Spine Wo Contrast    Standing Status:   Standing    Number of Occurrences:   1  . CT Lumbar Spine Wo Contrast    Standing Status:   Standing    Number of Occurrences:   1  . CT Abdomen Pelvis W Contrast    Standing Status:  Standing    Number of Occurrences:   1    Order Specific Question:   Does the patient have a contrast media/X-ray dye allergy?    Answer:   No    Order Specific Question:   If indicated for the ordered procedure, I authorize the administration of contrast media per Radiology protocol    Answer:   Yes    Order Specific Question:   Radiology Contrast Protocol - do NOT remove file path    Answer:   \\[email or web address]    Order Specific Question:   Is Oral Contrast requested for this exam?    Answer:   Per Radiology protocol    Order Specific Question:   ** REASON FOR EXAM (FREE TEXT)    Answer:   with PO and IV contrast, metastatic process on XR  . CT Chest W Contrast    Standing Status:   Standing    Number of Occurrences:   1    Order Specific Question:   Does the patient have a contrast media/X-ray dye allergy?    Answer:   No    Order Specific Question:   If indicated for the ordered procedure, I authorize the administration of contrast media per Radiology protocol    Answer:   Yes    Order Specific Question:   Radiology Contrast Protocol - do NOT remove file path    Answer:   \\[email or web address]  . Comprehensive metabolic panel    Standing Status:   Standing    Number of Occurrences:   1  . CBC with Differential    Standing Status:   Standing    Number of Occurrences:   1  . Urinalysis, Routine w reflex microscopic    Standing Status:   Standing    Number of Occurrences:   1  . T4, free    Standing Status:   Standing    Number of Occurrences:   1  . Parathyroid hormone, intact (no Ca)    Standing Status:    Standing    Number of Occurrences:   1  . T4    Standing Status:   Standing    Number of Occurrences:   1  . TSH    Standing Status:   Standing    Number of Occurrences:   1  . CEA    Standing Status:   Standing    Number of Occurrences:   1  . Cancer antigen 19-9    Standing Status:   Standing    Number of Occurrences:   1  . Chromosomes for Alpha Feto    Standing Status:   Standing    Number of Occurrences:   1  . Calcium, ionized    Standing Status:   Standing    Number of Occurrences:   1  . Consult to oncology  ALL PATIENTS BEING ADMITTED/HAVING PROCEDURES NEED COVID-19 SCREENING    ALL PATIENTS BEING ADMITTED/HAVING PROCEDURES NEED COVID-19 SCREENING    Standing Status:   Standing    Number of Occurrences:   1    Order Specific Question:   Place call to:    Answer:   on call    Order Specific Question:   Reason for Consult    Answer:   Consult  . ED EKG    Standing Status:   Standing    Number of Occurrences:   1    Order Specific Question:   Reason for Exam    Answer:  Other (See Comments)  . EKG 12-Lead    Standing Status:   Standing    Number of Occurrences:   1    All questions were answered. The patient knows to call the clinic with any problems, questions or concerns.  A total of more than 60 minutes were spent on this encounter and over half of that time was spent on counseling and coordination of care as outlined above.   [name], MD Department of Hematology/Oncology [place] [facility] Phone: [phone] Pager: [phone] Email: [email or web address]  11/03/2019 12:13 PM

## 2019-11-03 NOTE — ED Provider Notes (Signed)
[facility]ERGENC[facility] DEPARTMENT Provider Note   CSN: [id number] Arrival date & time: 11/03/19  0645     History Chief Complaint  Patient presents with  . Abnormal Lab    Kenneth Khan. is a 53 y.o. male.  53 year old male presents with complaint of right lower back pain for the past few weeks, onset after lifting his garage door when the spring broke.  Patient went to his PCP yesterday for evaluation of right lower back pain, had general annual lab work drawn and was contacted this morning at 530 and told his white count and calcium levels were abnormal and he should go to the hospital.  Patient is unaware what his lab results were.  He denies any complaints today beyond his low back pain.  Low back pain is worse with movement, improves with rest and the medications prescribed by his PCP including prednisone, Vicodin, Robaxin.  Past medical history of hypertension.  No other complaints or concerns.        Past Medical History:  Diagnosis Date  . Hypertension     There are no problems to display for this patient.   History reviewed. No pertinent surgical history.     No family history on file.  Social History   Tobacco Use  . Smoking status: Never Smoker  . Smokeless tobacco: Never Used  Substance Use Topics  . Alcohol use: Yes  . Drug use: Never    Home Medications Prior to Admission medications   Medication Sig Start Date End Date Taking? Authorizing Provider  aspirin EC 81 MG tablet Take 81 mg by mouth daily.   Yes [provider]  fenofibrate 54 MG tablet Take 54 mg by mouth daily.   Yes [provider]  HYDROcodone-acetaminophen (NORCO/VICODIN) 5-325 MG tablet Take 1 tablet by mouth every 6 (six) hours as needed for moderate pain.   Yes [provider]  lisinopril-hydrochlorothiazide (ZESTORETIC) 20-12.5 MG tablet Take 1 tablet by mouth daily.   Yes [provider]  methocarbamol (ROBAXIN) 750 MG  tablet Take 750 mg by mouth 3 (three) times daily.   Yes [provider]  pravastatin (PRAVACHOL) 40 MG tablet Take 40 mg by mouth daily.   Yes [provider]  predniSONE (STERAPRED UNI-PAK 21 TAB) 10 MG (21) TBPK tablet Take 10 mg by mouth See admin instructions. Started on 10-30-19 for 7 day supply. Pt's on  day 5 of therapy.   Yes [provider]    Allergies    Patient has no known allergies.  Review of Systems   Review of Systems  Constitutional: Negative for fever.  Respiratory: Negative for shortness of breath.   Cardiovascular: Negative for chest pain.  Gastrointestinal: Negative for abdominal pain, constipation, diarrhea, nausea and vomiting.  Genitourinary: Negative for difficulty urinating.  Musculoskeletal: Positive for back pain and myalgias. Negative for gait problem.  Skin: Negative for rash and wound.  Allergic/Immunologic: Negative for immunocompromised state.  Neurological: Negative for weakness and numbness.  Hematological: Negative for adenopathy.  Psychiatric/Behavioral: Negative for confusion.  All other systems reviewed and are negative.   Physical Exam Updated Vital Signs BP 119/62   Pulse 88   Temp 98 F (36.7 C) (Oral)   Resp (!) 21   Ht _0  (1.778 m)   Wt 83.9 kg   SpO2 99%   BMI 26.54 kg/m   Physical Exam Vitals and nursing note reviewed.  Constitutional:      General: He is  not in acute distress.    Appearance: He is well-developed. He is not diaphoretic.  HENT:     Head: Normocephalic and atraumatic.  Cardiovascular:     Rate and Rhythm: Normal rate and regular rhythm.     Pulses: Normal pulses.     Heart sounds: Normal heart sounds.  Pulmonary:     Effort: Pulmonary effort is normal.     Breath sounds: Normal breath sounds.  Abdominal:     Palpations: Abdomen is soft.     Tenderness: There is no abdominal tenderness.  Musculoskeletal:        General: Tenderness present.     Thoracic back: Normal.      Lumbar back: Tenderness present. No swelling or bony tenderness.       Back:     Right lower leg: No edema.     Left lower leg: No edema.  Skin:    General: Skin is warm and dry.     Findings: No erythema or rash.  Neurological:     Mental Status: He is alert and oriented to person, place, and time.  Psychiatric:        Behavior: Behavior normal.     ED Results / Procedures / Treatments   Labs (all labs ordered are listed, but only abnormal results are displayed) Labs Reviewed  COMPREHENSIVE METABOLIC PANEL - Abnormal; Notable for the following components:      Result Value   Glucose, Bld 153 (*)    BUN 46 (*)    Creatinine, Ser 1.59 (*)    Calcium 13.8 (*)    Albumin 2.9 (*)    Alkaline Phosphatase 179 (*)    GFR calc non Af Amer 48 (*)    GFR calc Af Amer 56 (*)    All other components within normal limits  CBC WITH DIFFERENTIAL/PLATELET - Abnormal; Notable for the following components:   WBC 34.0 (*)    Hemoglobin 10.4 (*)    HCT 35.8 (*)    MCV 70.1 (*)    MCH 20.4 (*)    MCHC 29.1 (*)    RDW 21.1 (*)    Platelets 566 (*)    Neutro Abs 30.0 (*)    Monocytes Absolute 1.9 (*)    Abs Immature Granulocytes 0.32 (*)    All other components within normal limits  URINALYSIS, ROUTINE W REFLEX MICROSCOPIC - Abnormal; Notable for the following components:   Color, Urine STRAW (*)    All other components within normal limits  SARS CORONAVIRUS 2 (TAT 6-24 HRS)  T4, FREE  TSH  PARATHYROID HORMONE, INTACT (NO CA)  T4  CEA  CANCER ANTIGEN 19-9  AP-AFP (ALPHA FETOPROTEIN)  CALCIUM, IONIZED  PATHOLOGIST SMEAR REVIEW    EKG EKG Interpretation  Date/Time:  Saturday November 03 2019 08:09:01 EST Ventricular Rate:  91 PR Interval:    QRS Duration: 91 QT Interval:  354 QTC Calculation: 436 R Axis:   -3 Text Interpretation: Sinus arrhythmia Ventricular premature complex Low voltage, precordial leads RSR' in V1 or V2, right VCD or RVH Borderline T abnormalities,  anterior leads No previous ECGs available Confirmed by Fredia Sorrow [phone]) on 11/03/2019 8:12:01 AM   Radiology DG Chest 2 View  Result Date: 11/03/2019 CLINICAL DATA:  Abnormal labs EXAM: CHEST - 2 VIEW COMPARISON:  None. FINDINGS: Normal heart size and mediastinal contours. No acute infiltrate or edema. No effusion or pneumothorax. No acute osseous findings. Artifact from EKG leads IMPRESSION: Negative chest. Electronically Signed  By: Monte Fantasia M.D.   On: 11/03/2019 09:14   DG Lumbar Spine Complete  Result Date: 11/03/2019 CLINICAL DATA:  Abnormal labs.  Back pain EXAM: LUMBAR SPINE - COMPLETE 4+ VIEW COMPARISON:  None. FINDINGS: T12 superior endplate fracture with mild height loss, age-indeterminate. No clear erosion, although question whether the right pedicle at T12 is more lucent than the others. Spondylitic spurring greatest at L2-3 where there is mild disc narrowing. No evidence of fracture or bone lesion. IMPRESSION: Age-indeterminate T12 superior endplate fracture with mild height loss. Considering the patient's calcium levels, suggests CT or MRI to exclude an underlying lesion. Electronically Signed   By: Monte Fantasia M.D.   On: 11/03/2019 08:31   CT Thoracic Spine Wo Contrast  Result Date: 11/03/2019 CLINICAL DATA:  Concern for pathologic fracture. EXAM: CT THORACIC AND LUMBAR SPINE WITHOUT CONTRAST TECHNIQUE: Multidetector CT imaging of the thoracic and lumbar spine was performed without contrast. Multiplanar CT image reconstructions were also generated. COMPARISON:  None. FINDINGS: CT THORACIC SPINE FINDINGS Alignment: Normal Vertebrae: Extensive infiltrative lesion within the T1 body without pathologic fracture. No visible extraosseous tumor extension. Left fifth posterior rib infiltrative lesion with probable minimal subpleural extension. T12 extensive infiltrative lesion within the body, right eccentric, with comminuted body fracture that appears recent. Central  height loss measures up to 60%. No significant retropulsion. Suspect there is posterior ventral epidural tumor extension at this level. Paraspinal and other soft tissues: Partially covered liver shows multiple low-density masses. There is also a partially covered mass at the left pancreatic tail. Disc levels: No significant degenerative changes CT LUMBAR SPINE FINDINGS Segmentation: 5 lumbar type vertebrae Alignment: Normal Vertebrae: Equivocal for early infiltrative lesion within the right posterior L5 body. No fracture. Paraspinal and other soft tissues: As above Disc levels: No significant degenerative changes or visible impingement IMPRESSION: 1. Metastatic pattern with infiltrative lesions in the T1 body, T12 body, and left posterior fifth rib. Pathologic fracture at T12 with moderate central height loss and probable ventral epidural tumor extension. 2. Partially covered abdomen shows pancreatic tail mass and extensive hepatic metastatic disease. Electronically Signed   By: Monte Fantasia M.D.   On: 11/03/2019 10:08   CT Lumbar Spine Wo Contrast  Result Date: 11/03/2019 CLINICAL DATA:  Concern for pathologic fracture. EXAM: CT THORACIC AND LUMBAR SPINE WITHOUT CONTRAST TECHNIQUE: Multidetector CT imaging of the thoracic and lumbar spine was performed without contrast. Multiplanar CT image reconstructions were also generated. COMPARISON:  None. FINDINGS: CT THORACIC SPINE FINDINGS Alignment: Normal Vertebrae: Extensive infiltrative lesion within the T1 body without pathologic fracture. No visible extraosseous tumor extension. Left fifth posterior rib infiltrative lesion with probable minimal subpleural extension. T12 extensive infiltrative lesion within the body, right eccentric, with comminuted body fracture that appears recent. Central height loss measures up to 60%. No significant retropulsion. Suspect there is posterior ventral epidural tumor extension at this level. Paraspinal and other soft tissues:  Partially covered liver shows multiple low-density masses. There is also a partially covered mass at the left pancreatic tail. Disc levels: No significant degenerative changes CT LUMBAR SPINE FINDINGS Segmentation: 5 lumbar type vertebrae Alignment: Normal Vertebrae: Equivocal for early infiltrative lesion within the right posterior L5 body. No fracture. Paraspinal and other soft tissues: As above Disc levels: No significant degenerative changes or visible impingement IMPRESSION: 1. Metastatic pattern with infiltrative lesions in the T1 body, T12 body, and left posterior fifth rib. Pathologic fracture at T12 with moderate central height loss and probable ventral epidural tumor extension.  2. Partially covered abdomen shows pancreatic tail mass and extensive hepatic metastatic disease. Electronically Signed   By: Monte Fantasia M.D.   On: 11/03/2019 10:08    Procedures Procedures (including critical care time)  Medications Ordered in ED Medications  morphine 4 MG/ML injection 4 mg (4 mg Intravenous Given 11/03/19 1136)  ondansetron (ZOFRAN) injection 4 mg (4 mg Intravenous Given 11/03/19 1136)  sodium chloride 0.9 % bolus 1,000 mL (0 mLs Intravenous Stopped 11/03/19 1229)  iohexol (OMNIPAQUE) 300 MG/ML solution 80 mL (80 mLs Intravenous Contrast Given 11/03/19 1330)    ED Course  I have reviewed the triage vital signs and the nursing notes.  Pertinent labs & imaging results that were available during my care of the patient were reviewed by me and considered in my medical decision making (see chart for details).  Clinical Course as of Nov 03 1347  Sat Nov 03, 2019  0820 53yo male presents with complaint of lab abnormality. Patient was seen by his PCP 4 days ago for right low back pain, started on prednisone PO and had routine annual lab work. Patient was called this morning and told to go to the ER for abnormal calcium and WBC.  Patient is without complaint other than right low back pain after  lifting his garage door when the spring on the door broke.  Patient takes Lisinopril/HCTZ for his HTN. Patient is not taking any supplemental vitamins (specifically no extra vitamin D). No history of thyroid or parathyroid dysfunction.    [LM]  1125 X-ray lumbar spine returns with T12 compression fracture, due to above abnormalities recommend CT for further evaluation for possible underlying lesion.  Chest x-ray unremarkable.  CT T-spine and L-spine shows T1 and T12 lesions, left 5th rib, limited abdominal images from l-spine series with concern for pancreatic/hepatic metastatic disease. Case discussed with Dr. Jana Hakim with oncology, will contact Dr. Lorenso Courier with oncology.  Discussed with Dr. Lorenso Courier, will add CT chest/abdomen/pelvis, additional labs, will see the patient in the ER, requests hospitalist to admit. Will contact hospitalist when Cts result.    [LM]  1329 Review of lab work, patient has elevated calcium at 13.8 with elevated alk phos at 179 and creatinine of 1.59.  PCP labs from yesterday show creatinine of 1.46 and calcium of 14.8. CBC with anemia with hemoglobin of 10.4, leukocytosis with white count of 34,000, question secondary to steroid use.  Labs from PCP yesterday shows CBC with a WBC of 27.4.   [LM]  3875 Case discussed teaching service who will consult for admission.   [LM]    Clinical Course User Index [LM] Roque Lias   MDM Rules/Calculators/A&P                      Final Clinical Impression(s) / ED Diagnoses Final diagnoses:  Hypercalcemia  Compression fracture of T12 vertebra, initial encounter ([place])  Leukocytosis, unspecified type  Anemia, unspecified type  Metastatic malignant neoplasm, unspecified site [facility])    Rx / DC Orders ED Discharge Orders    None       Tacy Learn, PA-C 11/03/19 1348    Fredia Sorrow, MD 11/04/19 1341

## 2019-11-04 ENCOUNTER — Inpatient Hospital Stay (HOSPITAL_COMMUNITY): Payer: Managed Care, Other (non HMO)

## 2019-11-04 ENCOUNTER — Encounter (HOSPITAL_COMMUNITY): Payer: Self-pay | Admitting: Internal Medicine

## 2019-11-04 DIAGNOSIS — C801 Malignant (primary) neoplasm, unspecified: Secondary | ICD-10-CM

## 2019-11-04 DIAGNOSIS — K8689 Other specified diseases of pancreas: Secondary | ICD-10-CM | POA: Diagnosis present

## 2019-11-04 DIAGNOSIS — C252 Malignant neoplasm of tail of pancreas: Secondary | ICD-10-CM

## 2019-11-04 DIAGNOSIS — E785 Hyperlipidemia, unspecified: Secondary | ICD-10-CM

## 2019-11-04 DIAGNOSIS — C787 Secondary malignant neoplasm of liver and intrahepatic bile duct: Secondary | ICD-10-CM

## 2019-11-04 DIAGNOSIS — R509 Fever, unspecified: Secondary | ICD-10-CM

## 2019-11-04 DIAGNOSIS — D509 Iron deficiency anemia, unspecified: Secondary | ICD-10-CM

## 2019-11-04 DIAGNOSIS — Z79899 Other long term (current) drug therapy: Secondary | ICD-10-CM

## 2019-11-04 DIAGNOSIS — C7951 Secondary malignant neoplasm of bone: Secondary | ICD-10-CM

## 2019-11-04 DIAGNOSIS — D72829 Elevated white blood cell count, unspecified: Secondary | ICD-10-CM

## 2019-11-04 DIAGNOSIS — I10 Essential (primary) hypertension: Secondary | ICD-10-CM

## 2019-11-04 DIAGNOSIS — M8448XA Pathological fracture, other site, initial encounter for fracture: Secondary | ICD-10-CM

## 2019-11-04 DIAGNOSIS — R000 Tachycardia, unspecified: Secondary | ICD-10-CM

## 2019-11-04 LAB — PROTIME-INR
INR: 1.3 — ABNORMAL HIGH (ref 0.8–1.2)
Prothrombin Time: 15.7 seconds — ABNORMAL HIGH (ref 11.4–15.2)

## 2019-11-04 LAB — BASIC METABOLIC PANEL
Anion gap: 8 (ref 5–15)
BUN: 28 mg/dL — ABNORMAL HIGH (ref 6–20)
CO2: 23 mmol/L (ref 22–32)
Calcium: 11.1 mg/dL — ABNORMAL HIGH (ref 8.9–10.3)
Chloride: 109 mmol/L (ref 98–111)
Creatinine, Ser: 1.47 mg/dL — ABNORMAL HIGH (ref 0.61–1.24)
GFR calc Af Amer: >60 mL/min (ref >60)
GFR calc non Af Amer: 53 mL/min — ABNORMAL LOW (ref >60)
Glucose, Bld: 149 mg/dL — ABNORMAL HIGH (ref 70–99)
Potassium: 4 mmol/L (ref 3.5–5.1)
Sodium: 140 mmol/L (ref 135–145)

## 2019-11-04 LAB — CBC
HCT: 30.2 % — ABNORMAL LOW (ref 39.0–52.0)
Hemoglobin: 8.9 g/dL — ABNORMAL LOW (ref 13.0–17.0)
MCH: 20.5 pg — ABNORMAL LOW (ref 26.0–34.0)
MCHC: 29.5 g/dL — ABNORMAL LOW (ref 30.0–36.0)
MCV: 69.4 fL — ABNORMAL LOW (ref 80.0–100.0)
Platelets: 279 10*3/uL (ref 150–400)
RBC: 4.35 MIL/uL (ref 4.22–5.81)
RDW: 21.2 % — ABNORMAL HIGH (ref 11.5–15.5)
WBC: 18.3 10*3/uL — ABNORMAL HIGH (ref 4.0–10.5)
nRBC: 0 % (ref 0.0–0.2)

## 2019-11-04 LAB — URINALYSIS, ROUTINE W REFLEX MICROSCOPIC
Bilirubin Urine: NEGATIVE
Glucose, UA: NEGATIVE mg/dL
Hgb urine dipstick: NEGATIVE
Ketones, ur: NEGATIVE mg/dL
Leukocytes,Ua: NEGATIVE
Nitrite: NEGATIVE
Protein, ur: NEGATIVE mg/dL
Specific Gravity, Urine: 1.009 (ref 1.005–1.030)
pH: 7 (ref 5.0–8.0)

## 2019-11-04 LAB — COMPREHENSIVE METABOLIC PANEL
ALT: 28 U/L (ref 0–44)
AST: 32 U/L (ref 15–41)
Albumin: 2.4 g/dL — ABNORMAL LOW (ref 3.5–5.0)
Alkaline Phosphatase: 145 U/L — ABNORMAL HIGH (ref 38–126)
Anion gap: 7 (ref 5–15)
BUN: 32 mg/dL — ABNORMAL HIGH (ref 6–20)
CO2: 25 mmol/L (ref 22–32)
Calcium: 12.2 mg/dL — ABNORMAL HIGH (ref 8.9–10.3)
Chloride: 107 mmol/L (ref 98–111)
Creatinine, Ser: 1.52 mg/dL — ABNORMAL HIGH (ref 0.61–1.24)
GFR calc Af Amer: 59 mL/min — ABNORMAL LOW (ref >60)
GFR calc non Af Amer: 51 mL/min — ABNORMAL LOW (ref >60)
Glucose, Bld: 115 mg/dL — ABNORMAL HIGH (ref 70–99)
Potassium: 4.1 mmol/L (ref 3.5–5.1)
Sodium: 139 mmol/L (ref 135–145)
Total Bilirubin: 0.8 mg/dL (ref 0.3–1.2)
Total Protein: 5.7 g/dL — ABNORMAL LOW (ref 6.5–8.1)

## 2019-11-04 LAB — IRON AND TIBC
Iron: 7 ug/dL — ABNORMAL LOW (ref 45–182)
Saturation Ratios: 2 % — ABNORMAL LOW (ref 17.9–39.5)
TIBC: 309 ug/dL (ref 250–450)
UIBC: 302 ug/dL

## 2019-11-04 LAB — PSA: Prostatic Specific Antigen: 0.38 ng/mL (ref 0.00–4.00)

## 2019-11-04 LAB — APTT: aPTT: 32 seconds (ref 24–36)

## 2019-11-04 LAB — LACTATE DEHYDROGENASE: LDH: 110 U/L (ref 98–192)

## 2019-11-04 LAB — FERRITIN: Ferritin: 307 ng/mL (ref 24–336)

## 2019-11-04 LAB — CEA: CEA: 22.3 ng/mL — ABNORMAL HIGH (ref 0.0–4.7)

## 2019-11-04 LAB — CALCIUM, IONIZED: Calcium, Ionized, Serum: 8.3 mg/dL — ABNORMAL HIGH (ref 4.5–5.6)

## 2019-11-04 LAB — T4: T4, Total: 8.3 ug/dL (ref 4.5–12.0)

## 2019-11-04 LAB — PARATHYROID HORMONE, INTACT (NO CA): PTH: 10 pg/mL — ABNORMAL LOW (ref 15–65)

## 2019-11-04 MED ORDER — SODIUM CHLORIDE 0.9 % IV BOLUS
1000.0000 mL | Freq: Once | INTRAVENOUS | Status: AC
  Administered 2019-11-04: 1000 mL via INTRAVENOUS

## 2019-11-04 MED ORDER — CALCITONIN (SALMON) 200 UNIT/ML IJ SOLN: 4.0000 [IU]/kg | Freq: Two times a day (BID) | INTRAMUSCULAR | Status: DC

## 2019-11-04 MED ORDER — TECHNETIUM TC 99M MEDRONATE IV KIT
21.5000 | PACK | Freq: Once | INTRAVENOUS | Status: AC | PRN
  Administered 2019-11-04: 21.5 via INTRAVENOUS

## 2019-11-04 MED ORDER — ACETAMINOPHEN 325 MG PO TABS
325.0000 mg | ORAL_TABLET | Freq: Four times a day (QID) | ORAL | Status: DC | PRN
  Administered 2019-11-04: 650 mg via ORAL
  Filled 2019-11-04: qty 2

## 2019-11-04 MED ORDER — ACETAMINOPHEN 325 MG PO TABS: 650.0000 mg | ORAL_TABLET | Freq: Four times a day (QID) | ORAL | Status: DC | PRN

## 2019-11-04 MED ORDER — HYDROMORPHONE HCL 1 MG/ML IJ SOLN
1.0000 mg | Freq: Four times a day (QID) | INTRAMUSCULAR | Status: DC | PRN
  Administered 2019-11-04 – 2019-11-06 (×4): 1 mg via INTRAVENOUS
  Filled 2019-11-04 (×5): qty 1

## 2019-11-04 MED ORDER — SODIUM CHLORIDE 0.9 % IV SOLN: INTRAVENOUS | Status: AC

## 2019-11-04 MED ORDER — ENOXAPARIN SODIUM 40 MG/0.4ML ~~LOC~~ SOLN: 40.0000 mg | SUBCUTANEOUS | Status: DC

## 2019-11-04 NOTE — H&P (Signed)
Chief Complaint: Liver lesion  Referring Physician(s): [place]  Supervising Physician: Jacqulynn Cadet  Patient Status: [facility] - In-pt  History of Present Illness: Kenneth Khan. is a 53 y.o. male who presented to the ED yesterday after instruction from his PCP due to abnormal WBC and Ca++.  He was being evaluated by his PCP for back pain which he has had for a few weeks. He thought he may have injured his back after lifting his garage door.   He does report worsening fatigue, especially at the end of the day.  Workup in the ED included CT scan which showed = 1. 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 Innumerable hepatic masses, mildly enlarged abdominal lymph nodes and lytic lesions involving T1, bilateral ribs, manubrium, RIGHT iliac bone and T12 noted compatible with diffuse metastatic disease. 50% pathologic fracture of T12. 2. Cholelithiasis without CT evidence of acute cholecystitis.  We are asked to evaluate him for a biopsy.  Past Medical History:  Diagnosis Date  . Hypertension     History reviewed. No pertinent surgical history.  Allergies: Patient has no known allergies.  Medications: Prior to Admission medications   Medication Sig Start Date End Date Taking? Authorizing Provider  aspirin EC 81 MG tablet Take 81 mg by mouth daily.   Yes [provider]  fenofibrate 54 MG tablet Take 54 mg by mouth daily.   Yes [provider]  HYDROcodone-acetaminophen (NORCO/VICODIN) 5-325 MG tablet Take 1 tablet by mouth every 6 (six) hours as needed for moderate pain.   Yes [provider]  lisinopril-hydrochlorothiazide (ZESTORETIC) 20-12.5 MG tablet Take 1 tablet by mouth daily.   Yes [provider]  methocarbamol (ROBAXIN) 750 MG tablet Take 750 mg by mouth 3 (three) times daily.   Yes [provider]  pravastatin (PRAVACHOL) 40 MG tablet Take 40 mg by mouth daily.   Yes [provider]  predniSONE (STERAPRED UNI-PAK 21 TAB) 10 MG (21) TBPK tablet Take 10 mg by mouth See admin instructions. Started on 10-30-19 for 7 day supply. Pt's on  day 5 of therapy.   Yes [provider]     No family history on file.  Social History   Socioeconomic History  . Marital status: Single    Spouse name: Not on file  . Number of children: Not on file  . Years of education: Not on file  . Highest education level: Not on file  Occupational History  . Not on file  Tobacco Use  . Smoking status: Never Smoker  . Smokeless tobacco: Never Used  Substance and Sexual Activity  . Alcohol use: Yes  . Drug use: Never  . Sexual activity: Not on file  Other Topics Concern  . Not on file  Social History Narrative  . Not on file   Social Determinants of Health   Financial Resource Strain:   . Difficulty of Paying Living Expenses: Not on file  Food Insecurity:   . Worried About Charity fundraiser in the Last Year: Not on file  . Ran Out of Food in the Last Year: Not on file  Transportation Needs:   . Lack of Transportation (Medical): Not on file  . Lack of Transportation (Non-Medical): Not on file  Physical Activity:   . Days of Exercise per Week: Not on file  . Minutes of Exercise per Session: Not on file  Stress:   . Feeling of Stress : Not  on file  Social Connections:   . Frequency of Communication with Friends and Family: Not on file  . Frequency of Social Gatherings with Friends and Family: Not on file  . Attends Religious Services: Not on file  . Active Member of Clubs or Organizations: Not on file  . Attends Archivist Meetings: Not on file  . Marital Status: Not on file     Review of Systems: A 12 point ROS discussed and pertinent positives are indicated in the HPI above.  All other systems are negative.  Review of Systems  Constitutional: Positive for activity change and fatigue.  Musculoskeletal: Positive for back pain.    Vital  Signs: BP 135/79 (BP Location: Left Arm)   Pulse (!) 127   Temp 99.2 F (37.3 C) (Oral)   Resp 16   Ht 5\' 10"  (1.778 m)   Wt 83.9 kg   SpO2 93%   BMI 26.54 kg/m   Physical Exam Vitals reviewed.  Constitutional:      Appearance: Normal appearance.  HENT:     Head: Normocephalic and atraumatic.  Eyes:     Extraocular Movements: Extraocular movements intact.  Cardiovascular:     Rate and Rhythm: Normal rate and regular rhythm.  Pulmonary:     Effort: Pulmonary effort is normal. No respiratory distress.     Breath sounds: Normal breath sounds.  Abdominal:     General: There is no distension.     Palpations: Abdomen is soft.     Tenderness: There is no abdominal tenderness.  Musculoskeletal:        General: Normal range of motion.     Cervical back: Normal range of motion.       Back:     Comments: Pain, tenderness  Skin:    General: Skin is warm and dry.  Neurological:     General: No focal deficit present.     Mental Status: He is alert and oriented to person, place, and time.  Psychiatric:        Mood and Affect: Mood normal.        Behavior: Behavior normal.        Thought Content: Thought content normal.        Judgment: Judgment normal.     Imaging: DG Chest 2 View  Result Date: 11/03/2019 CLINICAL DATA:  Abnormal labs EXAM: CHEST - 2 VIEW COMPARISON:  None. FINDINGS: Normal heart size and mediastinal contours. No acute infiltrate or edema. No effusion or pneumothorax. No acute osseous findings. Artifact from EKG leads IMPRESSION: Negative chest. Electronically Signed   By: Monte Fantasia M.D.   On: 11/03/2019 09:14   DG Lumbar Spine Complete  Result Date: 11/03/2019 CLINICAL DATA:  Abnormal labs.  Back pain EXAM: LUMBAR SPINE - COMPLETE 4+ VIEW COMPARISON:  None. FINDINGS: T12 superior endplate fracture with mild height loss, age-indeterminate. No clear erosion, although question whether the right pedicle at T12 is more lucent than the others. Spondylitic  spurring greatest at L2-3 where there is mild disc narrowing. No evidence of fracture or bone lesion. IMPRESSION: Age-indeterminate T12 superior endplate fracture with mild height loss. Considering the patient's calcium levels, suggests CT or MRI to exclude an underlying lesion. Electronically Signed   By: Monte Fantasia M.D.   On: 11/03/2019 08:31   CT Chest W Contrast  Result Date: 11/03/2019 CLINICAL DATA:  53 year old male with probable pathologic spine fracture identified on recent radiographs. Evaluate for malignancy/metastatic disease. EXAM: CT CHEST, ABDOMEN, AND PELVIS  WITH CONTRAST TECHNIQUE: Multidetector CT imaging of the chest, abdomen and pelvis was performed following the standard protocol during bolus administration of intravenous contrast. CONTRAST:  72mL OMNIPAQUE IOHEXOL 300 MG/ML  SOLN COMPARISON:  11/03/2019 lumbar spine radiographs FINDINGS: CT CHEST FINDINGS Cardiovascular: Heart size is normal. No thoracic aortic aneurysm or pericardial effusion. Mediastinum/Nodes: Shotty mediastinal and RIGHT juxta pericardial lymph nodes are noted. No mediastinal mass identified. The esophagus trachea and visualized thyroid are unremarkable. Lungs/Pleura: Mild bibasilar and dependent atelectasis identified. No evidence of pulmonary nodule, mass, airspace disease, consolidation, pleural effusion or pneumothorax. Musculoskeletal: Lytic lesions involving the T1 vertebral body, RIGHT 8th rib, LEFT 5th and 6th ribs and manubrium are noted compatible with bony metastatic disease. No acute fracture identified. CT ABDOMEN PELVIS FINDINGS Hepatobiliary: Innumerable low-density lesions throughout the liver noted and compatible with metastatic disease. No definite biliary dilatation identified. Cholelithiasis noted without CT evidence of acute cholecystitis. Pancreas: A 4 x 5 cm ill-defined hypoechoic mass within the pancreatic tail is identified, compatible with malignancy. This mass has a large contact  surface with the splenic artery. The remainder of the pancreas is unremarkable. No pancreatic ductal dilatation. Spleen: Mild splenomegaly identified. No definite focal hepatic splenic lesions are noted. Adrenals/Urinary Tract: The kidneys, adrenal glands and bladder are unremarkable. Stomach/Bowel: Stomach is within normal limits. Appendix appears normal. No evidence of bowel wall thickening, distention, or inflammatory changes. Vascular/Lymphatic: Mildly enlarged periportal and celiac lymph nodes are present. No vascular abnormalities are noted. Reproductive: Mild prostate enlargement is noted. Other: No ascites, focal collection or pneumoperitoneum. Musculoskeletal: A 50% compression fracture of T12 is identified with underlying lytic lesion. A probable lytic lesion within the posterior RIGHT iliac bone (image 106-series 3) is noted. IMPRESSION: 1. 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 Innumerable hepatic masses, mildly enlarged abdominal lymph nodes and lytic lesions involving T1, bilateral ribs, manubrium, RIGHT iliac bone and T12 noted compatible with diffuse metastatic disease. 50% pathologic fracture of T12. 2. Cholelithiasis without CT evidence of acute cholecystitis. Electronically Signed   By: Margarette Canada M.D.   On: 11/03/2019 14:12   CT Thoracic Spine Wo Contrast  Result Date: 11/03/2019 CLINICAL DATA:  Concern for pathologic fracture. EXAM: CT THORACIC AND LUMBAR SPINE WITHOUT CONTRAST TECHNIQUE: Multidetector CT imaging of the thoracic and lumbar spine was performed without contrast. Multiplanar CT image reconstructions were also generated. COMPARISON:  None. FINDINGS: CT THORACIC SPINE FINDINGS Alignment: Normal Vertebrae: Extensive infiltrative lesion within the T1 body without pathologic fracture. No visible extraosseous tumor extension. Left fifth posterior rib infiltrative lesion with probable minimal subpleural extension. T12 extensive infiltrative lesion  within the body, right eccentric, with comminuted body fracture that appears recent. Central height loss measures up to 60%. No significant retropulsion. Suspect there is posterior ventral epidural tumor extension at this level. Paraspinal and other soft tissues: Partially covered liver shows multiple low-density masses. There is also a partially covered mass at the left pancreatic tail. Disc levels: No significant degenerative changes CT LUMBAR SPINE FINDINGS Segmentation: 5 lumbar type vertebrae Alignment: Normal Vertebrae: Equivocal for early infiltrative lesion within the right posterior L5 body. No fracture. Paraspinal and other soft tissues: As above Disc levels: No significant degenerative changes or visible impingement IMPRESSION: 1. Metastatic pattern with infiltrative lesions in the T1 body, T12 body, and left posterior fifth rib. Pathologic fracture at T12 with moderate central height loss and probable ventral epidural tumor extension. 2. Partially covered abdomen shows pancreatic tail mass and extensive hepatic metastatic disease.  Electronically Signed   By: Monte Fantasia M.D.   On: 11/03/2019 10:08   CT Lumbar Spine Wo Contrast  Result Date: 11/03/2019 CLINICAL DATA:  Concern for pathologic fracture. EXAM: CT THORACIC AND LUMBAR SPINE WITHOUT CONTRAST TECHNIQUE: Multidetector CT imaging of the thoracic and lumbar spine was performed without contrast. Multiplanar CT image reconstructions were also generated. COMPARISON:  None. FINDINGS: CT THORACIC SPINE FINDINGS Alignment: Normal Vertebrae: Extensive infiltrative lesion within the T1 body without pathologic fracture. No visible extraosseous tumor extension. Left fifth posterior rib infiltrative lesion with probable minimal subpleural extension. T12 extensive infiltrative lesion within the body, right eccentric, with comminuted body fracture that appears recent. Central height loss measures up to 60%. No significant retropulsion. Suspect there is  posterior ventral epidural tumor extension at this level. Paraspinal and other soft tissues: Partially covered liver shows multiple low-density masses. There is also a partially covered mass at the left pancreatic tail. Disc levels: No significant degenerative changes CT LUMBAR SPINE FINDINGS Segmentation: 5 lumbar type vertebrae Alignment: Normal Vertebrae: Equivocal for early infiltrative lesion within the right posterior L5 body. No fracture. Paraspinal and other soft tissues: As above Disc levels: No significant degenerative changes or visible impingement IMPRESSION: 1. Metastatic pattern with infiltrative lesions in the T1 body, T12 body, and left posterior fifth rib. Pathologic fracture at T12 with moderate central height loss and probable ventral epidural tumor extension. 2. Partially covered abdomen shows pancreatic tail mass and extensive hepatic metastatic disease. Electronically Signed   By: Monte Fantasia M.D.   On: 11/03/2019 10:08   CT Abdomen Pelvis W Contrast  Result Date: 11/03/2019 CLINICAL DATA:  53 year old male with probable pathologic spine fracture identified on recent radiographs. Evaluate for malignancy/metastatic disease. EXAM: CT CHEST, ABDOMEN, AND PELVIS WITH CONTRAST TECHNIQUE: Multidetector CT imaging of the chest, abdomen and pelvis was performed following the standard protocol during bolus administration of intravenous contrast. CONTRAST:  11mL OMNIPAQUE IOHEXOL 300 MG/ML  SOLN COMPARISON:  11/03/2019 lumbar spine radiographs FINDINGS: CT CHEST FINDINGS Cardiovascular: Heart size is normal. No thoracic aortic aneurysm or pericardial effusion. Mediastinum/Nodes: Shotty mediastinal and RIGHT juxta pericardial lymph nodes are noted. No mediastinal mass identified. The esophagus trachea and visualized thyroid are unremarkable. Lungs/Pleura: Mild bibasilar and dependent atelectasis identified. No evidence of pulmonary nodule, mass, airspace disease, consolidation, pleural effusion  or pneumothorax. Musculoskeletal: Lytic lesions involving the T1 vertebral body, RIGHT 8th rib, LEFT 5th and 6th ribs and manubrium are noted compatible with bony metastatic disease. No acute fracture identified. CT ABDOMEN PELVIS FINDINGS Hepatobiliary: Innumerable low-density lesions throughout the liver noted and compatible with metastatic disease. No definite biliary dilatation identified. Cholelithiasis noted without CT evidence of acute cholecystitis. Pancreas: A 4 x 5 cm ill-defined hypoechoic mass within the pancreatic tail is identified, compatible with malignancy. This mass has a large contact surface with the splenic artery. The remainder of the pancreas is unremarkable. No pancreatic ductal dilatation. Spleen: Mild splenomegaly identified. No definite focal hepatic splenic lesions are noted. Adrenals/Urinary Tract: The kidneys, adrenal glands and bladder are unremarkable. Stomach/Bowel: Stomach is within normal limits. Appendix appears normal. No evidence of bowel wall thickening, distention, or inflammatory changes. Vascular/Lymphatic: Mildly enlarged periportal and celiac lymph nodes are present. No vascular abnormalities are noted. Reproductive: Mild prostate enlargement is noted. Other: No ascites, focal collection or pneumoperitoneum. Musculoskeletal: A 50% compression fracture of T12 is identified with underlying lytic lesion. A probable lytic lesion within the posterior RIGHT iliac bone (image 106-series 3) is noted. IMPRESSION: 1. 5  cm pancreatic tail mass highly suspicious for pancreatic malignancy. Innumerable hepatic masses, mildly enlarged abdominal lymph nodes and lytic lesions involving T1, bilateral ribs, manubrium, RIGHT iliac bone and T12 noted compatible with diffuse metastatic disease. 50% pathologic fracture of T12. 2. Cholelithiasis without CT evidence of acute cholecystitis. Electronically Signed   By: Margarette Canada M.D.   On: 11/03/2019 14:12    Labs:  CBC: Recent Labs     11/03/19 0705 11/04/19 0539  WBC 34.0* 18.3*  HGB 10.4* 8.9*  HCT 35.8* 30.2*  PLT 566* 279    COAGS: Recent Labs    11/04/19 0539  INR 1.3*  APTT 32    BMP: Recent Labs    11/03/19 0705 11/03/19 2006 11/04/19 0539  NA 138 136 139  K 4.2 4.0 4.1  CL 100 105 107  CO2 27 23 25   GLUCOSE 153* 246* 115*  BUN 46* 38* 32*  CALCIUM 13.8* 12.4* 12.2*  CREATININE 1.59* 1.57* 1.52*  GFRNONAA 48* 49* 51*  GFRAA 56* 57* 59*    LIVER FUNCTION TESTS: Recent Labs    11/03/19 0705 11/03/19 2006 11/04/19 0539  BILITOT 0.6 0.7 0.8  AST 35 28 32  ALT 32 26 28  ALKPHOS 179* 137* 145*  PROT 7.1 5.3* 5.7*  ALBUMIN 2.9* 2.3* 2.4*    TUMOR MARKERS: No results for input(s): AFPTM, CEA, CA199, CHROMGRNA in the last 8760 hours.  Assessment and Plan:  5 cm pancreatic tail mass highly suspicious for pancreatic malignancy. Innumerable hepatic masses, mildly enlarged abdominal lymph nodes.  Lytic lesions involving T1, bilateral ribs, manubrium, RIGHT iliac bone and T12 noted compatible with diffuse metastatic disease. 50% pathologic fracture of T12.  Will plan for biopsy of liver lesion on Monday.  Also recommend MRI of the Thoracic spine to evaluate lesion for possible Osteocool radiofrequency ablation and kyphoplasty.  Risks and benefits of liver mass biopsy was discussed with the patient and/or patient's family including, but not limited to bleeding, infection, damage to adjacent structures or low yield requiring additional tests.  All of the questions were answered and there is agreement to proceed.  Consent signed and in IR.  Thank you for this interesting consult.  I greatly enjoyed meeting [facility]. and look forward to participating in their care.  A copy of this report was sent to the requesting provider on this date.  Electronically Signed: Murrell Redden, PA-C   11/04/2019, 8:54 AM      I spent a total of 40 Minutes in face to face in clinical  consultation, greater than 50% of which was counseling/coordinating care for liver mass biopsy.

## 2019-11-04 NOTE — Progress Notes (Signed)
Subjective:  Kenneth Khan was seen and evaluated at bedside this morning. Patient states that he is doing well, but fatigued from waking up throughout the night. His back pain is a 2/10 while resting and a 6/10 while ambulating. We spoke about his procedures today. Patient states that hearing about, "the spots" around his body and liver is overwhelming. He states that the morphine has not completely taken his pain away, but that it does help manage the pain. Additionally, patient asks if the "spots" are all coming from the same source. We explained that we expect them to all be related and that they are concerning for cancer but the biopsy will be crucial for determining that.  Consults: heme/onc  Objective:  Vital signs in last 24 hours: Vitals:   11/04/19 0013 11/04/19 0605 11/04/19 0744 11/04/19 1122  BP: 114/75 135/79  127/64  Pulse: 100 (!) 127  (!) 124  Resp: 15 16  18   Temp: 98.4 F (36.9 C) (!) 100.5 F (38.1 C) 99.2 F (37.3 C) 98.4 F (36.9 C)  TempSrc: Oral Oral Oral Oral  SpO2: 95% 93%  95%  Weight:      Height:        Physical Exam  Constitutional: He is oriented to person, place, and time and well-developed, well-nourished, and in no distress. No distress.  Cardiovascular: Regular rhythm and normal heart sounds.  No murmur heard. tachy  Pulmonary/Chest: Effort normal. No respiratory distress.  Abdominal: Soft. Bowel sounds are normal. He exhibits no distension.  Neurological: He is alert and oriented to person, place, and time.  Skin: Skin is warm and dry. There is pallor.  Psychiatric: Affect normal.  Nursing note and vitals reviewed.   I/Os:  Intake/Output Summary (Last 24 hours) at 11/04/2019 1254 Last data filed at 11/04/2019 1125 Gross per 24 hour  Intake 1586.05 ml  Output 3150 ml  Net -1563.95 ml    Labs: Results for orders placed or performed during the hospital encounter of 11/03/19 (from the past 24 hour(s))  HIV Antibody (routine testing w  rflx)     Status: None   Collection Time: 11/03/19  2:45 PM  Result Value Ref Range   HIV Screen 4th Generation wRfx NON REACTIVE NON REACTIVE  Comprehensive metabolic panel     Status: Abnormal   Collection Time: 11/03/19  8:06 PM  Result Value Ref Range   Sodium 136 135 - 145 mmol/L   Potassium 4.0 3.5 - 5.1 mmol/L   Chloride 105 98 - 111 mmol/L   CO2 23 22 - 32 mmol/L   Glucose, Bld 246 (H) 70 - 99 mg/dL   BUN 38 (H) 6 - 20 mg/dL   Creatinine, Ser 1.57 (H) 0.61 - 1.24 mg/dL   Calcium 12.4 (H) 8.9 - 10.3 mg/dL   Total Protein 5.3 (L) 6.5 - 8.1 g/dL   Albumin 2.3 (L) 3.5 - 5.0 g/dL   AST 28 15 - 41 U/L   ALT 26 0 - 44 U/L   Alkaline Phosphatase 137 (H) 38 - 126 U/L   Total Bilirubin 0.7 0.3 - 1.2 mg/dL   GFR calc non Af Amer 49 (L) >60 mL/min   GFR calc Af Amer 57 (L) >60 mL/min   Anion gap 8 5 - 15  Comprehensive metabolic panel     Status: Abnormal   Collection Time: 11/04/19  5:39 AM  Result Value Ref Range   Sodium 139 135 - 145 mmol/L   Potassium 4.1 3.5 - 5.1  mmol/L   Chloride 107 98 - 111 mmol/L   CO2 25 22 - 32 mmol/L   Glucose, Bld 115 (H) 70 - 99 mg/dL   BUN 32 (H) 6 - 20 mg/dL   Creatinine, Ser 1.52 (H) 0.61 - 1.24 mg/dL   Calcium 12.2 (H) 8.9 - 10.3 mg/dL   Total Protein 5.7 (L) 6.5 - 8.1 g/dL   Albumin 2.4 (L) 3.5 - 5.0 g/dL   AST 32 15 - 41 U/L   ALT 28 0 - 44 U/L   Alkaline Phosphatase 145 (H) 38 - 126 U/L   Total Bilirubin 0.8 0.3 - 1.2 mg/dL   GFR calc non Af Amer 51 (L) >60 mL/min   GFR calc Af Amer 59 (L) >60 mL/min   Anion gap 7 5 - 15  CBC     Status: Abnormal   Collection Time: 11/04/19  5:39 AM  Result Value Ref Range   WBC 18.3 (H) 4.0 - 10.5 K/uL   RBC 4.35 4.22 - 5.81 MIL/uL   Hemoglobin 8.9 (L) 13.0 - 17.0 g/dL   HCT 30.2 (L) 39.0 - 52.0 %   MCV 69.4 (L) 80.0 - 100.0 fL   MCH 20.5 (L) 26.0 - 34.0 pg   MCHC 29.5 (L) 30.0 - 36.0 g/dL   RDW 21.2 (H) 11.5 - 15.5 %   Platelets 279 150 - 400 K/uL   nRBC 0.0 0.0 - 0.2 %  Protime-INR      Status: Abnormal   Collection Time: 11/04/19  5:39 AM  Result Value Ref Range   Prothrombin Time 15.7 (H) 11.4 - 15.2 seconds   INR 1.3 (H) 0.8 - 1.2  APTT     Status: None   Collection Time: 11/04/19  5:39 AM  Result Value Ref Range   aPTT 32 24 - 36 seconds  Lactate dehydrogenase     Status: None   Collection Time: 11/04/19  5:39 AM  Result Value Ref Range   LDH 110 98 - 192 U/L  PSA     Status: None   Collection Time: 11/04/19  5:39 AM  Result Value Ref Range   Prostatic Specific Antigen 0.38 0.00 - 4.00 ng/mL  Ferritin     Status: None   Collection Time: 11/04/19  5:39 AM  Result Value Ref Range   Ferritin 307 24 - 336 ng/mL  Iron and TIBC     Status: Abnormal   Collection Time: 11/04/19  5:39 AM  Result Value Ref Range   Iron 7 (L) 45 - 182 ug/dL   TIBC 309 250 - 450 ug/dL   Saturation Ratios 2 (L) 17.9 - 39.5 %   UIBC 302 ug/dL  Urinalysis, Routine w reflex microscopic     Status: Abnormal   Collection Time: 11/04/19  7:44 AM  Result Value Ref Range   Color, Urine STRAW (A) YELLOW   APPearance CLEAR CLEAR   Specific Gravity, Urine 1.009 1.005 - 1.030   pH 7.0 5.0 - 8.0   Glucose, UA NEGATIVE NEGATIVE mg/dL   Hgb urine dipstick NEGATIVE NEGATIVE   Bilirubin Urine NEGATIVE NEGATIVE   Ketones, ur NEGATIVE NEGATIVE mg/dL   Protein, ur NEGATIVE NEGATIVE mg/dL   Nitrite NEGATIVE NEGATIVE   Leukocytes,Ua NEGATIVE NEGATIVE   Imaging: CT Chest, Abdomen, Pelvis W IMPRESSION: 1. 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 Innumerable hepatic masses, mildly enlarged abdominal lymph nodes and lytic lesions involving T1, bilateral ribs, manubrium, RIGHT iliac bone and T12 noted compatible  with diffuse metastatic disease. 50% pathologic fracture of T12. 2. Cholelithiasis without CT evidence of acute cholecystitis.  CT Lumbar/Thoracic Spine WO IMPRESSION: 1. Metastatic pattern with infiltrative lesions in the T1 body, T12 body, and left  posterior fifth rib. Pathologic fracture at T12 with moderate central height loss and probable ventral epidural tumor extension. 2. Partially covered abdomen shows pancreatic tail mass and extensive hepatic metastatic disease.  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   Compression fracture of T12:  Metastatic disease:  Patient with right lower back pain for several weeks following mechanical injury evaluated by PCP 2 days prior and noted to have significant leukocytosis and hypercalcemia. Imaging significant for compression fracture of T12 and lytic lesion in T1. CT Chest/Abdomen/Pelvis significant for a pancreatic tail mass, multiple hepatic masses, lytic lesions involving the bilateral ribs, sternum and right iliac bone consistent with diffuse metastatic disease. Tumor markers in process. Heme/onc on board, appreciate their recommendations. PSA wnl. INR slightly elevated to 1.3, LDH wnl at 110. CEA elevated at 22.3  - US guided liver biopsy scheduled for Monday - NM bone scan today - Dilaudid 1 mg q6 hrs and Norco/Vicodine 5-325 q6 hrs for pain - f/u AFP, CA 19-9 - follow up heme/onc recommendations  - consider palliative rad onc outpatient for painful bony metastases   Hypercalcemia:  Ca 13.8 on admission (14.8 on PCP's labs 2 days prior). This is likely secondary to his malignancy. He denies any abdominal pain, nausea/vomiting, muscle weakness. Ca down to 12.2 this morning after IV zolendronic acid and fluids. Corrected Ca still above 13 so Calcitonin given today. - continuous fluids and fluid boluses - BID CMPs  Acute microcytic anemia:  Hb 10.4 (Baseline 15.7) MCV 70.1. Iron deficiency anemia vs anemia of chronic disease. He has not had a  colonoscopy. Patient denies any hematochezia or changes in stools. He does note increasing fatigue over the past several months with 20lb weight loss and is noted to have pale conjunctivae on examination. Hgb down to 8.9 today which may be secondary to dilution from all the fluids for his hypercalcemia given all cell lines down. Iron panel shows low iron of 7, low saturation ratio of 2, normal TIBC of 309 and normal ferritin of 307 more consistent with anemia of chronic disease - FOBT  - CBC daily  Acute leukocytosis:  Fever: WBC 34 on admission. This is likely due to patient's malignancy and in setting of recent steroid use. WBC down to 18 in setting of lots of IV hydration due to hypercalcemia. Pt spiked a fever to 100.5 today but denies feeling like he has a fever or any infectious sx.  UA unremarkable. Bld cxs drawn. - bld cx pending  - CBC daily   Tachycardia: Pt with new onset tachycardia during admission in setting of a mild fever of 100.5. Given patient had a WBC of 34 on admission, concern is for infection. Admission imaging without evidence of such. UA obtained and bld cxs drawn. Additional concern is for PE given malignancy. Well's score 2.5 moderate risk for PE. Could be related to anxiety or infection.  - Will monitor with low suspicion to scan if patient becomes hypoxic.   History of hypertension: Patient is on lisinopril-HCTZ 20-12.5mg  daily. Normotensive on admission.  - Holding  due to normotension  History of hyperlipidemia: - Continue pravastatin 40mg  qd  - Continue fenofibrate 54mg  qd  Dispo: Anticipated discharge pending clinical course.  Al Decant, MD 11/04/2019, 12:54 PM Pager: [phone]

## 2019-11-05 ENCOUNTER — Inpatient Hospital Stay (HOSPITAL_COMMUNITY): Payer: Managed Care, Other (non HMO)

## 2019-11-05 ENCOUNTER — Other Ambulatory Visit: Payer: Self-pay

## 2019-11-05 LAB — SEDIMENTATION RATE: Sed Rate: 46 mm/hr — ABNORMAL HIGH (ref 0–16)

## 2019-11-05 LAB — PATHOLOGIST SMEAR REVIEW

## 2019-11-05 LAB — C-REACTIVE PROTEIN: CRP: 13.6 mg/dL — ABNORMAL HIGH (ref <1.0)

## 2019-11-05 MED ORDER — FENTANYL CITRATE (PF) 100 MCG/2ML IJ SOLN
INTRAMUSCULAR | Status: AC
  Filled 2019-11-05: qty 4

## 2019-11-05 MED ORDER — MIDAZOLAM HCL 2 MG/2ML IJ SOLN
INTRAMUSCULAR | Status: AC
  Filled 2019-11-05: qty 4

## 2019-11-05 MED ORDER — FENTANYL CITRATE (PF) 100 MCG/2ML IJ SOLN
INTRAMUSCULAR | Status: AC | PRN
  Administered 2019-11-05 (×2): 50 ug via INTRAVENOUS

## 2019-11-05 MED ORDER — MIDAZOLAM HCL 2 MG/2ML IJ SOLN
INTRAMUSCULAR | Status: AC | PRN
  Administered 2019-11-05 (×2): 1 mg via INTRAVENOUS

## 2019-11-05 MED ORDER — GADOBUTROL 1 MMOL/ML IV SOLN
8.0000 mL | Freq: Once | INTRAVENOUS | Status: AC | PRN
  Administered 2019-11-05: 8 mL via INTRAVENOUS

## 2019-11-05 MED ORDER — LIDOCAINE HCL (PF) 1 % IJ SOLN
INTRAMUSCULAR | Status: AC
  Filled 2019-11-05: qty 30

## 2019-11-05 MED ORDER — SODIUM CHLORIDE 0.9 % IV SOLN
INTRAVENOUS | Status: AC | PRN
  Administered 2019-11-05: 10 mL/h via INTRAVENOUS

## 2019-11-05 MED ORDER — GELATIN ABSORBABLE 12-7 MM EX MISC
CUTANEOUS | Status: AC
  Filled 2019-11-05: qty 1

## 2019-11-05 NOTE — Progress Notes (Signed)
ANTICOAGULATION CONSULT NOTE - Initial Consult  Pharmacy Consult for Enoxaparin Indication: VTE Prophylaxis (Restarting After IR Procedure)  No Known Allergies  Patient Measurements: Height: 5\' 10"  (177.8 cm) Weight: 185 lb (83.9 kg) IBW/kg (Calculated) : 73  Vital Signs: Temp: 98.1 F (36.7 C) (12/21 1202) Temp Source: Oral (12/21 1202) BP: 121/72 (12/21 1555) Pulse Rate: 81 (12/21 1555)  Labs: Recent Labs    11/03/19 0705 11/03/19 2006 11/04/19 0539 11/04/19 1242  HGB 10.4*  --  8.9*  --   HCT 35.8*  --  30.2*  --   PLT 566*  --  279  --   APTT  --   --  32  --   LABPROT  --   --  15.7*  --   INR  --   --  1.3*  --   CREATININE 1.59* 1.57* 1.52* 1.47*    Estimated Creatinine Clearance: 59.3 mL/min (A) (by C-G formula based on SCr of 1.47 mg/dL (H)).   Medical History: Past Medical History:  Diagnosis Date  . Hypertension     Assessment: 53 yr old male with recent dx of metastatic cancer (unknown primary); CT scan showing pancreatic tail lesion, liver mets, bone mets with lytic lesions on bone scan. Pt is S/P US-guided core biopsy of liver lesion by IR this afternoon.  Pt was receiving Lovenox 40 mg subcutaneous daily for VTE prophylaxis prior to procedure (last dose at 1435 on 11/03/19). IR PA ordered Lovenox to restart at 1700 on 11/05/19 (approx 24 hrs post procedure)  H/H 8.9/30.2; platelets 279; Scr 1.47, TBW CrCl ~68 ml/min  Goal of Therapy:  Prevention of VTE Monitor platelets by anticoagulation protocol: Yes   Plan:  Restart Lovenox 40 mg SQ Q 24 hrs tomorrow afternoon (12/22/2) at 1700, as ordered by IR PA Monitor CBC, signs/symptoms of bleeding  Gillermina Hu, PharmD, BCPS, [facility] Clinical Pharmacist 11/05/2019,4:21 PM

## 2019-11-05 NOTE — Consult Note (Signed)
Referring Provider: Dr. Rebeca Alert Primary Care Physician:  Nicholes Rough, PA-C Primary Gastroenterologist:  Althia Forts  Reason for Consultation:  Anemia  HPI: Kenneth Khan. is a 53 y.o. male with recent diagnosis of metastatic cancer (primary unknown at this time) but CT showing pancreatic tail lesion, liver mets and bone mets with lytic lesions seen on bone scan. He had been having lower back pain for the past few weeks reporting that he has a compression fracture and during work up for that was found to be hypercalcemic with a leukocytosis (WBC 34 on 11/03/19). Anemic with Hgb 8.9, MCV 69. He has lost 20 pounds unintentionally this year. Denies abdominal pain, N/V, melena or hematochezia. Occasional heartburn. Denies family history of colon cancer or stomach cancer. He has not had a colonoscopy.  Past Medical History:  Diagnosis Date  . Hypertension     History reviewed. No pertinent surgical history.  Prior to Admission medications   Medication Sig Start Date End Date Taking? Authorizing Provider  aspirin EC 81 MG tablet Take 81 mg by mouth daily.   Yes [provider]  fenofibrate 54 MG tablet Take 54 mg by mouth daily.   Yes [provider]  HYDROcodone-acetaminophen (NORCO/VICODIN) 5-325 MG tablet Take 1 tablet by mouth every 6 (six) hours as needed for moderate pain.   Yes [provider]  lisinopril-hydrochlorothiazide (ZESTORETIC) 20-12.5 MG tablet Take 1 tablet by mouth daily.   Yes [provider]  methocarbamol (ROBAXIN) 750 MG tablet Take 750 mg by mouth 3 (three) times daily.   Yes [provider]  pravastatin (PRAVACHOL) 40 MG tablet Take 40 mg by mouth daily.   Yes [provider]  predniSONE (STERAPRED UNI-PAK 21 TAB) 10 MG (21) TBPK tablet Take 10 mg by mouth See admin instructions. Started on 10-30-19 for 7 day supply. Pt's on  day 5 of therapy.   Yes [provider]    Scheduled Meds: . aspirin EC  81 mg  Oral Daily  . [START ON 11/06/2019] enoxaparin (LOVENOX) injection  40 mg Subcutaneous Q24H  . fenofibrate  54 mg Oral Daily  . pravastatin  40 mg Oral Daily   Continuous Infusions: PRN Meds:.acetaminophen, HYDROcodone-acetaminophen, HYDROmorphone (DILAUDID) injection, morphine injection, ondansetron (ZOFRAN) IV  Allergies as of 11/03/2019  . (No Known Allergies)    History reviewed. No pertinent family history.  Social History   Socioeconomic History  . Marital status: Single    Spouse name: Not on file  . Number of children: Not on file  . Years of education: Not on file  . Highest education level: Not on file  Occupational History  . Not on file  Tobacco Use  . Smoking status: Never Smoker  . Smokeless tobacco: Never Used  Substance and Sexual Activity  . Alcohol use: Yes  . Drug use: Never  . Sexual activity: Not on file  Other Topics Concern  . Not on file  Social History Narrative  . Not on file   Social Determinants of Health   Financial Resource Strain:   . Difficulty of Paying Living Expenses: Not on file  Food Insecurity:   . Worried About Charity fundraiser in the Last Year: Not on file  . Ran Out of Food in the Last Year: Not on file  Transportation Needs:   . Lack of Transportation (Medical): Not on file  . Lack of Transportation (Non-Medical): Not on file  Physical Activity:   . Days of Exercise per Week:  Not on file  . Minutes of Exercise per Session: Not on file  Stress:   . Feeling of Stress : Not on file  Social Connections:   . Frequency of Communication with Friends and Family: Not on file  . Frequency of Social Gatherings with Friends and Family: Not on file  . Attends Religious Services: Not on file  . Active Member of Clubs or Organizations: Not on file  . Attends Archivist Meetings: Not on file  . Marital Status: Not on file  Intimate Partner Violence:   . Fear of Current or Ex-Partner: Not on file  . Emotionally Abused:  Not on file  . Physically Abused: Not on file  . Sexually Abused: Not on file    Review of Systems: All negative except as stated above in HPI.  Physical Exam: Vital signs: Vitals:   11/05/19 0425 11/05/19 0800  BP: 115/62 116/66  Pulse: 79 83  Resp: 16 18  Temp: 98 F (36.7 C) 98.2 F (36.8 C)  SpO2: 96% 95%   Last BM Date: 11/02/19 General:   Alert,  Thin, pleasant and cooperative in NAD Head: normocephalic, atraumatic Eyes: anicteric sclera ENT: oropharynx clear Neck: supple, nontender Lungs:  Clear throughout to auscultation.   No wheezes, crackles, or rhonchi. No acute distress. Heart:  Regular rate and rhythm; no murmurs, clicks, rubs,  or gallops. Abdomen: soft, nontender, nondistended, +BS  Rectal:  Deferred Ext: no edema  GI:  Lab Results: Recent Labs    11/03/19 0705 11/04/19 0539  WBC 34.0* 18.3*  HGB 10.4* 8.9*  HCT 35.8* 30.2*  PLT 566* 279   BMET Recent Labs    11/03/19 2006 11/04/19 0539 11/04/19 1242  NA 136 139 140  K 4.0 4.1 4.0  CL 105 107 109  CO2 _0 GLUCOSE 246* 115* 149*  BUN 38* 32* 28*  CREATININE 1.57* 1.52* 1.47*  CALCIUM 12.4* 12.2* 11.1*   LFT Recent Labs    11/04/19 0539  PROT 5.7*  ALBUMIN 2.4*  AST 32  ALT 28  ALKPHOS 145*  BILITOT 0.8   PT/INR Recent Labs    11/04/19 0539  LABPROT 15.7*  INR 1.3*     Studies/Results: CT Chest W Contrast  Result Date: 11/03/2019 CLINICAL DATA:  53 year old male with probable pathologic spine fracture identified on recent radiographs. Evaluate for malignancy/metastatic disease. EXAM: CT CHEST, ABDOMEN, AND PELVIS WITH CONTRAST TECHNIQUE: Multidetector CT imaging of the chest, abdomen and pelvis was performed following the standard protocol during bolus administration of intravenous contrast. CONTRAST:  76m OMNIPAQUE IOHEXOL 300 MG/ML  SOLN COMPARISON:  11/03/2019 lumbar spine radiographs FINDINGS: CT CHEST FINDINGS Cardiovascular: Heart size is normal. No thoracic  aortic aneurysm or pericardial effusion. Mediastinum/Nodes: Shotty mediastinal and RIGHT juxta pericardial lymph nodes are noted. No mediastinal mass identified. The esophagus trachea and visualized thyroid are unremarkable. Lungs/Pleura: Mild bibasilar and dependent atelectasis identified. No evidence of pulmonary nodule, mass, airspace disease, consolidation, pleural effusion or pneumothorax. Musculoskeletal: Lytic lesions involving the T1 vertebral body, RIGHT 8th rib, LEFT 5th and 6th ribs and manubrium are noted compatible with bony metastatic disease. No acute fracture identified. CT ABDOMEN PELVIS FINDINGS Hepatobiliary: Innumerable low-density lesions throughout the liver noted and compatible with metastatic disease. No definite biliary dilatation identified. Cholelithiasis noted without CT evidence of acute cholecystitis. Pancreas: A 4 x 5 cm ill-defined hypoechoic mass within the pancreatic tail is identified, compatible with malignancy. This mass has a large contact surface with the splenic  artery. The remainder of the pancreas is unremarkable. No pancreatic ductal dilatation. Spleen: Mild splenomegaly identified. No definite focal hepatic splenic lesions are noted. Adrenals/Urinary Tract: The kidneys, adrenal glands and bladder are unremarkable. Stomach/Bowel: Stomach is within normal limits. Appendix appears normal. No evidence of bowel wall thickening, distention, or inflammatory changes. Vascular/Lymphatic: Mildly enlarged periportal and celiac lymph nodes are present. No vascular abnormalities are noted. Reproductive: Mild prostate enlargement is noted. Other: No ascites, focal collection or pneumoperitoneum. Musculoskeletal: A 50% compression fracture of T12 is identified with underlying lytic lesion. A probable lytic lesion within the posterior RIGHT iliac bone (image 106-series 3) is noted. IMPRESSION: 1. 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 Innumerable hepatic  masses, mildly enlarged abdominal lymph nodes and lytic lesions involving T1, bilateral ribs, manubrium, RIGHT iliac bone and T12 noted compatible with diffuse metastatic disease. 50% pathologic fracture of T12. 2. Cholelithiasis without CT evidence of acute cholecystitis. Electronically Signed   By: Margarette Canada M.D.   On: 11/03/2019 14:12   NM Bone Scan Whole Body  Result Date: 11/04/2019 CLINICAL DATA:  Evaluate for bony metastatic disease. EXAM: NUCLEAR MEDICINE WHOLE BODY BONE SCAN TECHNIQUE: Whole body anterior and posterior images were obtained approximately 3 hours after intravenous injection of radiopharmaceutical. RADIOPHARMACEUTICALS:  21.5 mCi Technetium-59mMDP IV COMPARISON:  CT scan of the chest, abdomen, and pelvis November 03, 2019 FINDINGS: The patient's bony metastatic disease seen on yesterday's CT scan is almost entirely lytic which limits evaluation with a nuclear medicine bone scan. There is a focus of uptake in the left periorbital region, slightly asymmetric to the right. There is greater uptake in the right mastoid compared to the left. There is uptake in several anterior right ribs and and at least 1 posterior right rib. No definitive abnormal uptake in the left ribs, upper extremities, or sternum. The known lucent lesion in T1 on the CT scan is not appreciated on this study. The patient's known compression fracture of T12 is not appreciated on this study. The lytic lesion in the left femoral neck is not appreciated on this study. There does appear to be some mild increased uptake in the proximal left femoral diaphysis however. The apparent lytic lesion within the left iliac bone on the recent CT scan on image 106 demonstrates no correlate on this study. No other abnormalities in the lower extremities. Increased uptake in the soft tissues of the right upper quadrant are consistent with the patient's known liver metastases. Uptake within the kidneys is normal. Slight increased soft  tissue uptake superior to the left kidney correlates with the known pancreatic mass. No other soft tissue abnormalities identified. IMPRESSION: 1. The apparent bony metastatic disease on the CT scan from November 03, 2019 is almost entirely lytic. Sensitivity of a nuclear medicine bone scan for lytic lesions is very limited. Nuclear medicine bone scans are much better in the evaluation of blastic metastases. Lytic metastases would be better assessed with an MRI or PET-CT. 2. There is a lytic lesion in the left femoral neck on the comparison CT scan which is not appreciated on this study. However, there is mild increased uptake in the proximal left femoral diaphysis concerning for a metastatic lesion, not well assessed on the recent CT scan. The lytic lesions in the proximal left femur put the patient at risk for pathologic femoral fracture. 3. Soft tissue uptake in the right upper quadrant is consistent with known liver metastases. 4. Soft tissue uptake superior to left kidney  likely correlates with the patient's pancreatic mass. 5. There is uptake over multiple anterior right ribs. The amount of uptake is out of proportion to the single lytic lesion seen on CT imaging. Bony metastatic disease in several anterior right ribs without CT correlate is possible. However, this uptake could be partially explained by summation of uptake in the right anterior ribs and the underlying liver metastases. 6. There is a metastatic lesion in a posterior right rib not seen on CT imaging. 7. The apparent lytic metastases in left-sided ribs on the comparison CT scan is not appreciated on this study. 8. The apparent lytic metastasis in the left iliac bone on the recent CT scan is not appreciated on this study. 9. The suspected metastatic disease in T1 and T12 is not appreciated on this study. The known compression fracture at T12 is also not visualized on this study. 10. The mild uptake in the left periorbital region and right mastoid  are nonspecific. These results will be called to the ordering clinician or representative by the Radiologist Assistant, and communication documented in the PACS or zVision Dashboard. Electronically Signed   By: Dorise Bullion III M.D   On: 11/04/2019 14:11   CT Abdomen Pelvis W Contrast  Result Date: 11/03/2019 CLINICAL DATA:  53 year old male with probable pathologic spine fracture identified on recent radiographs. Evaluate for malignancy/metastatic disease. EXAM: CT CHEST, ABDOMEN, AND PELVIS WITH CONTRAST TECHNIQUE: Multidetector CT imaging of the chest, abdomen and pelvis was performed following the standard protocol during bolus administration of intravenous contrast. CONTRAST:  70m OMNIPAQUE IOHEXOL 300 MG/ML  SOLN COMPARISON:  11/03/2019 lumbar spine radiographs FINDINGS: CT CHEST FINDINGS Cardiovascular: Heart size is normal. No thoracic aortic aneurysm or pericardial effusion. Mediastinum/Nodes: Shotty mediastinal and RIGHT juxta pericardial lymph nodes are noted. No mediastinal mass identified. The esophagus trachea and visualized thyroid are unremarkable. Lungs/Pleura: Mild bibasilar and dependent atelectasis identified. No evidence of pulmonary nodule, mass, airspace disease, consolidation, pleural effusion or pneumothorax. Musculoskeletal: Lytic lesions involving the T1 vertebral body, RIGHT 8th rib, LEFT 5th and 6th ribs and manubrium are noted compatible with bony metastatic disease. No acute fracture identified. CT ABDOMEN PELVIS FINDINGS Hepatobiliary: Innumerable low-density lesions throughout the liver noted and compatible with metastatic disease. No definite biliary dilatation identified. Cholelithiasis noted without CT evidence of acute cholecystitis. Pancreas: A 4 x 5 cm ill-defined hypoechoic mass within the pancreatic tail is identified, compatible with malignancy. This mass has a large contact surface with the splenic artery. The remainder of the pancreas is unremarkable. No  pancreatic ductal dilatation. Spleen: Mild splenomegaly identified. No definite focal hepatic splenic lesions are noted. Adrenals/Urinary Tract: The kidneys, adrenal glands and bladder are unremarkable. Stomach/Bowel: Stomach is within normal limits. Appendix appears normal. No evidence of bowel wall thickening, distention, or inflammatory changes. Vascular/Lymphatic: Mildly enlarged periportal and celiac lymph nodes are present. No vascular abnormalities are noted. Reproductive: Mild prostate enlargement is noted. Other: No ascites, focal collection or pneumoperitoneum. Musculoskeletal: A 50% compression fracture of T12 is identified with underlying lytic lesion. A probable lytic lesion within the posterior RIGHT iliac bone (image 106-series 3) is noted. IMPRESSION: 1. 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 Innumerable hepatic masses, mildly enlarged abdominal lymph nodes and lytic lesions involving T1, bilateral ribs, manubrium, RIGHT iliac bone and T12 noted compatible with diffuse metastatic disease. 50% pathologic fracture of T12. 2. Cholelithiasis without CT evidence of acute cholecystitis. Electronically Signed   By: JMargarette CanadaM.D.   On: 11/03/2019  14:12    Impression/Plan: Newly diagnosed metastatic cancer of unknown primary with microcytic anemia. Anemia likely multifactorial with his metastatic malignancy. CT concerning for met pancreatic cancer and agree with liver biopsy as next step to determine the primary source. Colon cancer less likely with normal bowel on CT and lack of overt bleeding although still possible but with the large pancreatic tail mass and liver and bone mets I think the source is pancreatic until proven otherwise. If liver biopsy is inconclusive and an outpt PET scan fails to identify the primary source of his met cancer, then a colonoscopy/EGD would be recommended but not needed if liver biopsy identifies the primary. Please call us back if liver  biopsy is not conclusive or if questions arise otherwise [facility] will sign off. Thank you for this consultation.    LOS: 2 days   Lear Ng  11/05/2019, 11:33 AM  Questions please call [phone]

## 2019-11-05 NOTE — Progress Notes (Signed)
Subjective:   Patient seen this morning on rounds. He is resting comfortably in bed and appears to be in good spirits. He states that his pain is 2/10 in his back and believes that his pain is well controlled. He denies fever, SHOB, cough, constipation. I informed the patient about his most recent scans and counseled him regarding the need for biopsy. I spoke to him about the need to consult orthopedic surgery and his increased risk for fractures. The patient expresses understanding.   Consults: heme/onc  Objective:  Vital signs in last 24 hours: Vitals:   11/04/19 1841 11/04/19 2245 11/05/19 0425 11/05/19 0800  BP:  115/68 115/62 116/66  Pulse:  100 79 83  Resp:  _0 Temp: (!) 101.5 F (38.6 C) 99.5 F (37.5 C) 98 F (36.7 C) 98.2 F (36.8 C)  TempSrc: Rectal Oral Oral Oral  SpO2:  98% 96% 95%  Weight:      Height:        Physical Exam  Constitutional: He is oriented to person, place, and time and well-developed, well-nourished, and in no distress. No distress.  Cardiovascular: Normal rate, regular rhythm and normal heart sounds.  No murmur heard. Pulmonary/Chest: Effort normal. No respiratory distress.  Abdominal: Soft. Bowel sounds are normal. He exhibits no distension.  Neurological: He is alert and oriented to person, place, and time.  Skin: Skin is warm and dry. There is pallor.  Psychiatric: Affect normal.  Nursing note and vitals reviewed.   I/Os:  Intake/Output Summary (Last 24 hours) at 11/05/2019 1025 Last data filed at 11/04/2019 1716 Gross per 24 hour  Intake 1889.11 ml  Output 1650 ml  Net 239.11 ml    Labs: Results for orders placed or performed during the hospital encounter of 11/03/19 (from the past 24 hour(s))  Basic metabolic panel     Status: Abnormal   Collection Time: 11/04/19 12:42 PM  Result Value Ref Range   Sodium 140 135 - 145 mmol/L   Potassium 4.0 3.5 - 5.1 mmol/L   Chloride 109 98 - 111 mmol/L   CO2 23 22 - 32 mmol/L   Glucose, Bld 149 (H) 70 - 99 mg/dL   BUN 28 (H) 6 - 20 mg/dL   Creatinine, Ser 1.47 (H) 0.61 - 1.24 mg/dL   Calcium 11.1 (H) 8.9 - 10.3 mg/dL   GFR calc non Af Amer 53 (L) >60 mL/min   GFR calc Af Amer >60 >60 mL/min   Anion gap 8 5 - 15   Imaging: NM Bone Scan IMPRESSION: 1. The apparent bony metastatic disease on the CT scan from November 03, 2019 is almost entirely lytic. Sensitivity of a nuclear medicine bone scan for lytic lesions is very limited. Nuclear medicine bone scans are much better in the evaluation of blastic metastases. Lytic metastases would be better assessed with an MRI or PET-CT. 2. There is a lytic lesion in the left femoral neck on the comparison CT scan which is not appreciated on this study. However, there is mild increased uptake in the proximal left femoral diaphysis concerning for a metastatic lesion, not well assessed on the recent CT scan. The lytic lesions in the proximal left femur put the patient at risk for pathologic femoral fracture. 3. Soft tissue uptake in the right upper quadrant is consistent with known liver metastases. 4. Soft tissue uptake superior to left kidney likely correlates with the patient's pancreatic mass. 5. There is uptake over multiple anterior right ribs. The amount  of uptake is out of proportion to the single lytic lesion seen on CT imaging. Bony metastatic disease in several anterior right ribs without CT correlate is possible. However, this uptake could be partially explained by summation of uptake in the right anterior ribs and the underlying liver metastases. 6. There is a metastatic lesion in a posterior right rib not seen on CT imaging. 7. The apparent lytic metastases in left-sided ribs on the comparison CT scan is not appreciated on this study. 8. The apparent lytic metastasis in the left iliac bone on the recent CT scan is not appreciated on this study. 9. The suspected metastatic disease in T1 and T12 is not  appreciated on this study. The known compression fracture at T12 is also not visualized on this study. 10. The mild uptake in the left periorbital region and right mastoid are nonspecific.   Assessment/Plan:  [name] is a 53 yr old male with history of HTN and HLD presenting with few weeks of lower back pain and hypercalcemia found to have widely metastatic disease on imaging, likely from a pancreatic primary, undergoing additional diagnostic workup.   Compression fracture of T12:  Metastatic disease:  Patient with right lower back pain for several weeks following mechanical injury evaluated by PCP  and noted to have significant leukocytosis and hypercalcemia. Imaging significant for compression fracture of T12 and lytic lesion in T1. CT Chest/Abdomen/Pelvis significant for a pancreatic tail mass, multiple hepatic masses, lytic lesions involving the bilateral ribs, sternum and right iliac bone consistent with diffuse metastatic disease. Tumor markers in process. Heme/onc on board, appreciate their recommendations. PSA wnl. INR slightly elevated to 1.3, LDH wnl at 110. CEA elevated at 22.3. NM Bone scan show lytic lesion in proximal left femur putting patient at risk for pathologic fracture. Ortho consulted but no ortho onc at this hospital so they recommend transfer to tertiary care center. Per discussion with onc, given patient without sx from that lytic lesion, will address outpatient. Onc concerned about his severe iron deficiency and recommends GI consult and ESR/CRP. - US guided liver biopsy scheduled for today - GI consult, fu recs  - fu ESR/CRP - f/u AFP, CA 19-9 - Dilaudid 1 mg q6 hrs and Norco/Vicodine 5-325 q6 hrs for pain - consider palliative rad onc outpatient for painful bony metastases   Hypercalcemia:  Ca 13.8 on admission (14.8 on PCP's labs 2 days prior). This is likely secondary to his malignancy. He denies any abdominal pain, nausea/vomiting, muscle weakness.  Ca down to 11.1 yesterday afternoon after IV zolendronic acid and fluids. Corrected Ca likely around 12. - continuous fluids - fu CMP  Acute microcytic anemia:  Hb 10.4 (Baseline 15.7) MCV 70.1. Iron deficiency anemia vs anemia of chronic disease. He has not had a colonoscopy. Patient denies any hematochezia or changes in stools. He does note increasing fatigue over the past several months with 20lb weight loss and is noted to have pale conjunctivae on examination. Hgb down to 8.9 yesterday which may be secondary to dilution from all the fluids for his hypercalcemia given all cell lines down. Iron panel shows low iron of 7, low saturation ratio of 2, normal TIBC of 309 and normal ferritin of 307. Per onc, ferritin likely falsely elevated due to malignancy and can confirm by checking other inflammatory markers, CRP/ESR. Onc recommends GI consult due to concern for bleed. - fu CBC and FOBT - GI consult, fu recs - fu ESR/CRP  Leukocytosis:  Fever: WBC 34  on admission. This is likely due to patient's malignancy and in setting of recent steroid use. WBC down to 18 in setting of lots of IV hydration due to hypercalcemia. Pt spiked fevers yesterday but denies feeling like he has a fever or any infectious sx.  UA unremarkable. Bld cxs drawn. Likely due to metastatic disease. - bld cx pending  - CBC daily   History of hypertension: Patient is on lisinopril-HCTZ 20-12.36m daily. Normotensive on admission.  - Holding due to normotension  History of hyperlipidemia: - Continue pravastatin 438mqd  - Continue fenofibrate 5420md  Dispo: Anticipated discharge pending clinical course.  LanAl DecantD 11/05/2019, 10:25 AM Pager: [phone]

## 2019-11-05 NOTE — Progress Notes (Signed)
[place] [phone]   [phone]  PROGRESS NOTE  Patient Care Team: Randa Evens as PCP - General (Physician Assistant)  Hematological/Oncological History # Imaging Concerning for Metastatic Disease  1) 11/03/2019: presented to [name] after seeing his PCP the day prior. Noted to have lab abnormalities. CT Thoracic Spine/Lumbar shows lesion to T1 and compression fracture of T12. CT C/A/P showed 4 x 5 cm ill-defined hypoechoic mass within the pancreatic tail and innumerable low-density lesions throughout the liver noted and compatible with metastatic disease. Noted to have microcytic anemia and hypercalcemia to 13.8.  2) 11/05/2019: underwent US guided biopsy of liver lesion. Pathology pending.   Interval History:  Kenneth Khan. 53 y.o. male with medical history significant for metastatic tumor currently undergoing biopsy and evaluation.  On exam today Kenneth Khan appeared anxious.  He reported that he continues to have back pain, but otherwise has been well.  He has been febrile in the interim as well, however he notes that he has not been having any infectious symptoms.  He notes he continues to have good appetite and denies any change in his bowel habits.  On further discussion today he continues to deny having any dark stools.  Furthermore he reports no other sources of bleeding including nosebleeds, gum bleeding, or blood in the urine.  He had no other concerns or questions.  Full 10 point ROS is listed below.  MEDICAL HISTORY:  Past Medical History:  Diagnosis Date  . Hypertension     SURGICAL HISTORY: History reviewed. No pertinent surgical history.  SOCIAL HISTORY: Social History   Socioeconomic History  . Marital status: Single    Spouse name: Not on file  . Number of children: Not on file  . Years of education: Not on file  . Highest education level: Not on file  Occupational History  . Not on file  Tobacco Use    . Smoking status: Never Smoker  . Smokeless tobacco: Never Used  Substance and Sexual Activity  . Alcohol use: Yes  . Drug use: Never  . Sexual activity: Not on file  Other Topics Concern  . Not on file  Social History Narrative  . Not on file   Social Determinants of Health   Financial Resource Strain:   . Difficulty of Paying Living Expenses: Not on file  Food Insecurity:   . Worried About Charity fundraiser in the Last Year: Not on file  . Ran Out of Food in the Last Year: Not on file  Transportation Needs:   . Lack of Transportation (Medical): Not on file  . Lack of Transportation (Non-Medical): Not on file  Physical Activity:   . Days of Exercise per Week: Not on file  . Minutes of Exercise per Session: Not on file  Stress:   . Feeling of Stress : Not on file  Social Connections:   . Frequency of Communication with Friends and Family: Not on file  . Frequency of Social Gatherings with Friends and Family: Not on file  . Attends Religious Services: Not on file  . Active Member of Clubs or Organizations: Not on file  . Attends Archivist Meetings: Not on file  . Marital Status: Not on file  Intimate Partner Violence:   . Fear of Current or Ex-Partner: Not on file  . Emotionally Abused: Not on file  . Physically Abused: Not on file  . Sexually Abused: Not on file  FAMILY HISTORY: History reviewed. No pertinent family history.  ALLERGIES:  has No Known Allergies.  MEDICATIONS:  Current Facility-Administered Medications  Medication Dose Route Frequency Provider Last Rate Last Admin  . acetaminophen (TYLENOL) tablet 325-650 mg  325-650 mg Oral Q6H PRN Jean Rosenthal, MD   650 mg at 11/04/19 1718  . aspirin EC tablet 81 mg  81 mg Oral Daily Harvie Heck, MD   81 mg at 11/05/19 0828  . [START ON 11/06/2019] enoxaparin (LOVENOX) injection 40 mg  40 mg Subcutaneous Q24H Ardis Rowan, PA-C      . fenofibrate tablet 54 mg  54 mg Oral Daily Harvie Heck, MD   54 mg at 11/05/19 0828  . fentaNYL (SUBLIMAZE) 100 MCG/2ML injection           . gelatin adsorbable (GELFOAM/SURGIFOAM) 12-7 MM sponge 12-7 mm           . HYDROcodone-acetaminophen (NORCO/VICODIN) 5-325 MG per tablet 1 tablet  1 tablet Oral Q6H PRN Harvie Heck, MD   1 tablet at 11/05/19 1029  . HYDROmorphone (DILAUDID) injection 1 mg  1 mg Intravenous Q6H PRN Maudie Mercury, MD   1 mg at 11/05/19 1401  . lidocaine (PF) (XYLOCAINE) 1 % injection           . midazolam (VERSED) 2 MG/2ML injection           . morphine 4 MG/ML injection 4 mg  4 mg Intravenous Q4H PRN Harvie Heck, MD   4 mg at 11/03/19 1136  . ondansetron (ZOFRAN) injection 4 mg  4 mg Intravenous Q6H PRN Harvie Heck, MD   4 mg at 11/03/19 1136  . pravastatin (PRAVACHOL) tablet 40 mg  40 mg Oral Daily Aslam, Loralyn Freshwater, MD   40 mg at 11/05/19 [id number]    REVIEW OF SYSTEMS:   Constitutional: ( - ) fevers, ( - )  chills , ( - ) night sweats Eyes: ( - ) blurriness of vision, ( - ) double vision, ( - ) watery eyes Ears, nose, mouth, throat, and face: ( - ) mucositis, ( - ) sore throat Respiratory: ( - ) cough, ( - ) dyspnea, ( - ) wheezes Cardiovascular: ( - ) palpitation, ( - ) chest discomfort, ( - ) lower extremity swelling Gastrointestinal:  ( - ) nausea, ( - ) heartburn, ( - ) change in bowel habits Skin: ( - ) abnormal skin rashes Lymphatics: ( - ) new lymphadenopathy, ( - ) easy bruising Neurological: ( - ) numbness, ( - ) tingling, ( - ) new weaknesses Behavioral/Psych: ( - ) mood change, ( - ) new changes  All other systems were reviewed with the patient and are negative.  PHYSICAL EXAMINATION: ECOG PERFORMANCE STATUS: 1 - Symptomatic but completely ambulatory  Vitals:   11/05/19 1555 11/05/19 1728  BP: 121/72 121/73  Pulse: 81 94  Resp: 19 16  Temp:  97.7 F (36.5 C)  SpO2: 94% 98%   Filed Weights   11/03/19 0707  Weight: 185 lb (83.9 kg)    GENERAL: well appearing middle aged Caucasian male in NAD   SKIN: skin color, texture, turgor are normal, no rashes or significant lesions EYES: conjunctiva are pink and non-injected, sclera clear LUNGS: clear to auscultation and percussion with normal breathing effort HEART: regular rate & rhythm and no murmurs and no lower extremity edema ABDOMEN: soft, non-tender, non-distended, normal bowel sounds Musculoskeletal: no cyanosis of digits and no clubbing  PSYCH: alert & oriented x  3, fluent speech NEURO: no focal motor/sensory deficits  LABORATORY DATA:  I have reviewed the data as listed Lab Results  Component Value Date   WBC 18.3 (H) 11/04/2019   HGB 8.9 (L) 11/04/2019   HCT 30.2 (L) 11/04/2019   MCV 69.4 (L) 11/04/2019   PLT 279 11/04/2019   NEUTROABS 30.0 (H) 11/03/2019    RADIOGRAPHIC STUDIES: I have personally reviewed the radiological images as listed and agreed with the findings in the report.  DG Chest 2 View  Result Date: 11/03/2019 CLINICAL DATA:  Abnormal labs EXAM: CHEST - 2 VIEW COMPARISON:  None. FINDINGS: Normal heart size and mediastinal contours. No acute infiltrate or edema. No effusion or pneumothorax. No acute osseous findings. Artifact from EKG leads IMPRESSION: Negative chest. Electronically Signed   By: Monte Fantasia M.D.   On: 11/03/2019 09:14   DG Lumbar Spine Complete  Result Date: 11/03/2019 CLINICAL DATA:  Abnormal labs.  Back pain EXAM: LUMBAR SPINE - COMPLETE 4+ VIEW COMPARISON:  None. FINDINGS: T12 superior endplate fracture with mild height loss, age-indeterminate. No clear erosion, although question whether the right pedicle at T12 is more lucent than the others. Spondylitic spurring greatest at L2-3 where there is mild disc narrowing. No evidence of fracture or bone lesion. IMPRESSION: Age-indeterminate T12 superior endplate fracture with mild height loss. Considering the patient's calcium levels, suggests CT or MRI to exclude an underlying lesion. Electronically Signed   By: Monte Fantasia M.D.   On:  11/03/2019 08:31   CT Chest W Contrast  Result Date: 11/03/2019 CLINICAL DATA:  53 year old male with probable pathologic spine fracture identified on recent radiographs. Evaluate for malignancy/metastatic disease. EXAM: CT CHEST, ABDOMEN, AND PELVIS WITH CONTRAST TECHNIQUE: Multidetector CT imaging of the chest, abdomen and pelvis was performed following the standard protocol during bolus administration of intravenous contrast. CONTRAST:  96mL OMNIPAQUE IOHEXOL 300 MG/ML  SOLN COMPARISON:  11/03/2019 lumbar spine radiographs FINDINGS: CT CHEST FINDINGS Cardiovascular: Heart size is normal. No thoracic aortic aneurysm or pericardial effusion. Mediastinum/Nodes: Shotty mediastinal and RIGHT juxta pericardial lymph nodes are noted. No mediastinal mass identified. The esophagus trachea and visualized thyroid are unremarkable. Lungs/Pleura: Mild bibasilar and dependent atelectasis identified. No evidence of pulmonary nodule, mass, airspace disease, consolidation, pleural effusion or pneumothorax. Musculoskeletal: Lytic lesions involving the T1 vertebral body, RIGHT 8th rib, LEFT 5th and 6th ribs and manubrium are noted compatible with bony metastatic disease. No acute fracture identified. CT ABDOMEN PELVIS FINDINGS Hepatobiliary: Innumerable low-density lesions throughout the liver noted and compatible with metastatic disease. No definite biliary dilatation identified. Cholelithiasis noted without CT evidence of acute cholecystitis. Pancreas: A 4 x 5 cm ill-defined hypoechoic mass within the pancreatic tail is identified, compatible with malignancy. This mass has a large contact surface with the splenic artery. The remainder of the pancreas is unremarkable. No pancreatic ductal dilatation. Spleen: Mild splenomegaly identified. No definite focal hepatic splenic lesions are noted. Adrenals/Urinary Tract: The kidneys, adrenal glands and bladder are unremarkable. Stomach/Bowel: Stomach is within normal limits.  Appendix appears normal. No evidence of bowel wall thickening, distention, or inflammatory changes. Vascular/Lymphatic: Mildly enlarged periportal and celiac lymph nodes are present. No vascular abnormalities are noted. Reproductive: Mild prostate enlargement is noted. Other: No ascites, focal collection or pneumoperitoneum. Musculoskeletal: A 50% compression fracture of T12 is identified with underlying lytic lesion. A probable lytic lesion within the posterior RIGHT iliac bone (image 106-series 3) is noted. IMPRESSION: 1. 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 Innumerable hepatic  masses, mildly enlarged abdominal lymph nodes and lytic lesions involving T1, bilateral ribs, manubrium, RIGHT iliac bone and T12 noted compatible with diffuse metastatic disease. 50% pathologic fracture of T12. 2. Cholelithiasis without CT evidence of acute cholecystitis. Electronically Signed   By: Margarette Canada M.D.   On: 11/03/2019 14:12   CT Thoracic Spine Wo Contrast  Result Date: 11/03/2019 CLINICAL DATA:  Concern for pathologic fracture. EXAM: CT THORACIC AND LUMBAR SPINE WITHOUT CONTRAST TECHNIQUE: Multidetector CT imaging of the thoracic and lumbar spine was performed without contrast. Multiplanar CT image reconstructions were also generated. COMPARISON:  None. FINDINGS: CT THORACIC SPINE FINDINGS Alignment: Normal Vertebrae: Extensive infiltrative lesion within the T1 body without pathologic fracture. No visible extraosseous tumor extension. Left fifth posterior rib infiltrative lesion with probable minimal subpleural extension. T12 extensive infiltrative lesion within the body, right eccentric, with comminuted body fracture that appears recent. Central height loss measures up to 60%. No significant retropulsion. Suspect there is posterior ventral epidural tumor extension at this level. Paraspinal and other soft tissues: Partially covered liver shows multiple low-density masses. There is also  a partially covered mass at the left pancreatic tail. Disc levels: No significant degenerative changes CT LUMBAR SPINE FINDINGS Segmentation: 5 lumbar type vertebrae Alignment: Normal Vertebrae: Equivocal for early infiltrative lesion within the right posterior L5 body. No fracture. Paraspinal and other soft tissues: As above Disc levels: No significant degenerative changes or visible impingement IMPRESSION: 1. Metastatic pattern with infiltrative lesions in the T1 body, T12 body, and left posterior fifth rib. Pathologic fracture at T12 with moderate central height loss and probable ventral epidural tumor extension. 2. Partially covered abdomen shows pancreatic tail mass and extensive hepatic metastatic disease. Electronically Signed   By: Monte Fantasia M.D.   On: 11/03/2019 10:08   CT Lumbar Spine Wo Contrast  Result Date: 11/03/2019 CLINICAL DATA:  Concern for pathologic fracture. EXAM: CT THORACIC AND LUMBAR SPINE WITHOUT CONTRAST TECHNIQUE: Multidetector CT imaging of the thoracic and lumbar spine was performed without contrast. Multiplanar CT image reconstructions were also generated. COMPARISON:  None. FINDINGS: CT THORACIC SPINE FINDINGS Alignment: Normal Vertebrae: Extensive infiltrative lesion within the T1 body without pathologic fracture. No visible extraosseous tumor extension. Left fifth posterior rib infiltrative lesion with probable minimal subpleural extension. T12 extensive infiltrative lesion within the body, right eccentric, with comminuted body fracture that appears recent. Central height loss measures up to 60%. No significant retropulsion. Suspect there is posterior ventral epidural tumor extension at this level. Paraspinal and other soft tissues: Partially covered liver shows multiple low-density masses. There is also a partially covered mass at the left pancreatic tail. Disc levels: No significant degenerative changes CT LUMBAR SPINE FINDINGS Segmentation: 5 lumbar type vertebrae  Alignment: Normal Vertebrae: Equivocal for early infiltrative lesion within the right posterior L5 body. No fracture. Paraspinal and other soft tissues: As above Disc levels: No significant degenerative changes or visible impingement IMPRESSION: 1. Metastatic pattern with infiltrative lesions in the T1 body, T12 body, and left posterior fifth rib. Pathologic fracture at T12 with moderate central height loss and probable ventral epidural tumor extension. 2. Partially covered abdomen shows pancreatic tail mass and extensive hepatic metastatic disease. Electronically Signed   By: Monte Fantasia M.D.   On: 11/03/2019 10:08   NM Bone Scan Whole Body  Result Date: 11/04/2019 CLINICAL DATA:  Evaluate for bony metastatic disease. EXAM: NUCLEAR MEDICINE WHOLE BODY BONE SCAN TECHNIQUE: Whole body anterior and posterior images were obtained approximately 3 hours after intravenous injection of  radiopharmaceutical. RADIOPHARMACEUTICALS:  21.5 mCi Technetium-32m MDP IV COMPARISON:  CT scan of the chest, abdomen, and pelvis November 03, 2019 FINDINGS: The patient's bony metastatic disease seen on yesterday's CT scan is almost entirely lytic which limits evaluation with a nuclear medicine bone scan. There is a focus of uptake in the left periorbital region, slightly asymmetric to the right. There is greater uptake in the right mastoid compared to the left. There is uptake in several anterior right ribs and and at least 1 posterior right rib. No definitive abnormal uptake in the left ribs, upper extremities, or sternum. The known lucent lesion in T1 on the CT scan is not appreciated on this study. The patient's known compression fracture of T12 is not appreciated on this study. The lytic lesion in the left femoral neck is not appreciated on this study. There does appear to be some mild increased uptake in the proximal left femoral diaphysis however. The apparent lytic lesion within the left iliac bone on the recent CT scan on  image 106 demonstrates no correlate on this study. No other abnormalities in the lower extremities. Increased uptake in the soft tissues of the right upper quadrant are consistent with the patient's known liver metastases. Uptake within the kidneys is normal. Slight increased soft tissue uptake superior to the left kidney correlates with the known pancreatic mass. No other soft tissue abnormalities identified. IMPRESSION: 1. The apparent bony metastatic disease on the CT scan from November 03, 2019 is almost entirely lytic. Sensitivity of a nuclear medicine bone scan for lytic lesions is very limited. Nuclear medicine bone scans are much better in the evaluation of blastic metastases. Lytic metastases would be better assessed with an MRI or PET-CT. 2. There is a lytic lesion in the left femoral neck on the comparison CT scan which is not appreciated on this study. However, there is mild increased uptake in the proximal left femoral diaphysis concerning for a metastatic lesion, not well assessed on the recent CT scan. The lytic lesions in the proximal left femur put the patient at risk for pathologic femoral fracture. 3. Soft tissue uptake in the right upper quadrant is consistent with known liver metastases. 4. Soft tissue uptake superior to left kidney likely correlates with the patient's pancreatic mass. 5. There is uptake over multiple anterior right ribs. The amount of uptake is out of proportion to the single lytic lesion seen on CT imaging. Bony metastatic disease in several anterior right ribs without CT correlate is possible. However, this uptake could be partially explained by summation of uptake in the right anterior ribs and the underlying liver metastases. 6. There is a metastatic lesion in a posterior right rib not seen on CT imaging. 7. The apparent lytic metastases in left-sided ribs on the comparison CT scan is not appreciated on this study. 8. The apparent lytic metastasis in the left iliac bone on  the recent CT scan is not appreciated on this study. 9. The suspected metastatic disease in T1 and T12 is not appreciated on this study. The known compression fracture at T12 is also not visualized on this study. 10. The mild uptake in the left periorbital region and right mastoid are nonspecific. These results will be called to the ordering clinician or representative by the Radiologist Assistant, and communication documented in the PACS or zVision Dashboard. Electronically Signed   By: Dorise Bullion III M.D   On: 11/04/2019 14:11   CT Abdomen Pelvis W Contrast  Result Date: 11/03/2019 CLINICAL DATA:  53 year old male with probable pathologic spine fracture identified on recent radiographs. Evaluate for malignancy/metastatic disease. EXAM: CT CHEST, ABDOMEN, AND PELVIS WITH CONTRAST TECHNIQUE: Multidetector CT imaging of the chest, abdomen and pelvis was performed following the standard protocol during bolus administration of intravenous contrast. CONTRAST:  52mL OMNIPAQUE IOHEXOL 300 MG/ML  SOLN COMPARISON:  11/03/2019 lumbar spine radiographs FINDINGS: CT CHEST FINDINGS Cardiovascular: Heart size is normal. No thoracic aortic aneurysm or pericardial effusion. Mediastinum/Nodes: Shotty mediastinal and RIGHT juxta pericardial lymph nodes are noted. No mediastinal mass identified. The esophagus trachea and visualized thyroid are unremarkable. Lungs/Pleura: Mild bibasilar and dependent atelectasis identified. No evidence of pulmonary nodule, mass, airspace disease, consolidation, pleural effusion or pneumothorax. Musculoskeletal: Lytic lesions involving the T1 vertebral body, RIGHT 8th rib, LEFT 5th and 6th ribs and manubrium are noted compatible with bony metastatic disease. No acute fracture identified. CT ABDOMEN PELVIS FINDINGS Hepatobiliary: Innumerable low-density lesions throughout the liver noted and compatible with metastatic disease. No definite biliary dilatation identified. Cholelithiasis noted  without CT evidence of acute cholecystitis. Pancreas: A 4 x 5 cm ill-defined hypoechoic mass within the pancreatic tail is identified, compatible with malignancy. This mass has a large contact surface with the splenic artery. The remainder of the pancreas is unremarkable. No pancreatic ductal dilatation. Spleen: Mild splenomegaly identified. No definite focal hepatic splenic lesions are noted. Adrenals/Urinary Tract: The kidneys, adrenal glands and bladder are unremarkable. Stomach/Bowel: Stomach is within normal limits. Appendix appears normal. No evidence of bowel wall thickening, distention, or inflammatory changes. Vascular/Lymphatic: Mildly enlarged periportal and celiac lymph nodes are present. No vascular abnormalities are noted. Reproductive: Mild prostate enlargement is noted. Other: No ascites, focal collection or pneumoperitoneum. Musculoskeletal: A 50% compression fracture of T12 is identified with underlying lytic lesion. A probable lytic lesion within the posterior RIGHT iliac bone (image 106-series 3) is noted. IMPRESSION: 1. 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 Innumerable hepatic masses, mildly enlarged abdominal lymph nodes and lytic lesions involving T1, bilateral ribs, manubrium, RIGHT iliac bone and T12 noted compatible with diffuse metastatic disease. 50% pathologic fracture of T12. 2. Cholelithiasis without CT evidence of acute cholecystitis. Electronically Signed   By: Margarette Canada M.D.   On: 11/03/2019 14:12   US BIOPSY (LIVER)  Result Date: 11/05/2019 INDICATION: 53 year old male with imaging findings highly concerning for metastatic pancreatic adenocarcinoma. He presents for ultrasound-guided core biopsy of liver lesion to confirm. EXAM: ULTRASOUND BIOPSY CORE LIVER MEDICATIONS: None. ANESTHESIA/SEDATION: Moderate (conscious) sedation was employed during this procedure. A total of Versed 2 mg and Fentanyl 100 mcg was administered intravenously.  Moderate Sedation Time: 10 minutes. The patient's level of consciousness and vital signs were monitored continuously by radiology nursing throughout the procedure under my direct supervision. FLUOROSCOPY TIME:  None. COMPLICATIONS: None immediate. PROCEDURE: Informed written consent was obtained from the patient after a thorough discussion of the procedural risks, benefits and alternatives. All questions were addressed. Maximal Sterile Barrier Technique was utilized including caps, mask, sterile gowns, sterile gloves, sterile drape, hand hygiene and skin antiseptic. A timeout was performed prior to the initiation of the procedure. Ultrasound was used to interrogate the liver. Numerous echogenic and centrally necrotic lesions are present throughout the hepatic parenchyma. A suitable lesion in hepatic segment 3 was identified. A suitable skin entry site was selected and marked. The overlying skin was sterilely prepped and draped in the standard fashion using chlorhexidine skin prep. Local anesthesia was attained by infiltration with 1% lidocaine. A small dermatotomy was made. Under  real-time ultrasound guidance, a 17 gauge introducer needle was advanced through the liver and positioned at the margin of the mass. Multiple 18 gauge core biopsies were then coaxially obtained using the bio Pince automated biopsy device. Biopsy specimens were placed in formalin and delivered to pathology for further analysis. As the introducer needle was removed, the biopsy tract was embolized with a Gel-Foam slurry. The patient tolerated the procedure well. IMPRESSION: Ultrasound-guided core biopsy of liver lesion. Electronically Signed   By: Jacqulynn Cadet M.D.   On: 11/05/2019 17:37    ASSESSMENT & PLAN Kenneth Khan. 53 y.o. male with medical history significant for HTN and HLD who presented to his PCP with back pain and was found to have marked leukocytosis and hypercalcemia.  After this he was referred to the emergency  department where a CT scan of the thoracic and lumbar spine revealed lytic lesions in T1 and a compression fracture of T12.    In the interim since her last visit with the patient on his day of admission 11/03/2019 the patient has undergone a CT chest abdomen pelvis which does reveal concerning disease in the tail of the pancreas as well as metastatic lesions of the liver.  At this time we are waiting tumor markers of AFP and CA 19-9 and the results of the ultrasound-guided biopsy which was performed today.  The patient's calcium has fortunately trended downward with the administration of IV Zometa on 11/03/2019 as well as fluid hydration.  Unfortunately the interim he has developed fevers with no clear infectious source.  This coupled with his leukocytosis are most likely secondary to his solid tumor though I would encourage thorough work-up and rule out of infectious etiologies.  Patient has also developed a microcytic anemia with iron deficiency, likely secondary to GI loss.  Gastroenterology was consulted and recommended no endoscopic evaluation at this time.  As such we will recommend beginning p.o. supplementation of iron sulfate.  We are currently planning to have the patient return to our clinic in early January 2021 at that time we should have the results of the biopsy and a clear prognosis and treatment plan.  #Imaging Concerning for Metastatic Disease --awaiting results of US guided biopsy of tumor in the liver.  --tumor markers including AFP and CA 19-9 are in process. --oncology service will continue to follow.  --f/u to be scheduled in outpatient setting in the first week of January 2021.   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 Fortunately patient is asymptomatic at this time -- IV zometa administered on 11/03/2019.  --continue treatment of hypercalcemia by primary general medicine team.   #Microcytic Anemia, Acute #Thrombocytosis, Acute --findings are  consistent with an iron deficiency anemia, most probably from GI blood loss --appreciate GI consultation and recommendations. --please start PO iron sulfate 325mg  daily with a source of vitamin C (Orange juice or 500 mg tablets vitamin C).  --continue to monitor Hgb.   #Leukocytosis, Acute #Fever --likely 2/2 to this patients malignancy, with some contribution from recent steroid taper. --neutrophilic predominance with normal differential --patient has been febrile during this admission, blood cultures collected and pending. The temperature increase may represent tumor fever, though ID cause needs to be effectively r/o.  --continue to monitor  All questions were answered. The patient knows to call the clinic with any problems, questions or concerns.  [name], MD Department of Hematology/Oncology [place] at [facility] Phone: [phone] Pager: [phone] Email: [email or web address]  11/05/2019 5:48 PM

## 2019-11-05 NOTE — Procedures (Signed)
Interventional Radiology Procedure Note  Procedure: US guided core biopsy of liver lesion.   Complications: None.   Estimated Blood Loss: None  Recommendations: - Bedrest x 3 hrs - Path sent   Signed,  Criselda Peaches, MD

## 2019-11-06 ENCOUNTER — Other Ambulatory Visit: Payer: Self-pay | Admitting: Oncology

## 2019-11-06 DIAGNOSIS — K8689 Other specified diseases of pancreas: Secondary | ICD-10-CM

## 2019-11-06 LAB — COMPREHENSIVE METABOLIC PANEL
ALT: 38 U/L (ref 0–44)
AST: 51 U/L — ABNORMAL HIGH (ref 15–41)
Albumin: 2.2 g/dL — ABNORMAL LOW (ref 3.5–5.0)
Alkaline Phosphatase: 193 U/L — ABNORMAL HIGH (ref 38–126)
Anion gap: 11 (ref 5–15)
BUN: 20 mg/dL (ref 6–20)
CO2: 23 mmol/L (ref 22–32)
Calcium: 10 mg/dL (ref 8.9–10.3)
Chloride: 105 mmol/L (ref 98–111)
Creatinine, Ser: 1.33 mg/dL — ABNORMAL HIGH (ref 0.61–1.24)
GFR calc Af Amer: >60 mL/min (ref >60)
GFR calc non Af Amer: >60 mL/min (ref >60)
Glucose, Bld: 150 mg/dL — ABNORMAL HIGH (ref 70–99)
Potassium: 3.6 mmol/L (ref 3.5–5.1)
Sodium: 139 mmol/L (ref 135–145)
Total Bilirubin: 0.6 mg/dL (ref 0.3–1.2)
Total Protein: 5.2 g/dL — ABNORMAL LOW (ref 6.5–8.1)

## 2019-11-06 LAB — CBC
HCT: 26.9 % — ABNORMAL LOW (ref 39.0–52.0)
Hemoglobin: 8 g/dL — ABNORMAL LOW (ref 13.0–17.0)
MCH: 20.7 pg — ABNORMAL LOW (ref 26.0–34.0)
MCHC: 29.7 g/dL — ABNORMAL LOW (ref 30.0–36.0)
MCV: 69.5 fL — ABNORMAL LOW (ref 80.0–100.0)
Platelets: 185 10*3/uL (ref 150–400)
RBC: 3.87 MIL/uL — ABNORMAL LOW (ref 4.22–5.81)
RDW: 21.5 % — ABNORMAL HIGH (ref 11.5–15.5)
WBC: 16.2 10*3/uL — ABNORMAL HIGH (ref 4.0–10.5)
nRBC: 0 % (ref 0.0–0.2)

## 2019-11-06 MED ORDER — LISINOPRIL-HYDROCHLOROTHIAZIDE 20-12.5 MG PO TABS: ORAL_TABLET | ORAL | 0 refills | Status: DC

## 2019-11-06 MED ORDER — FERROUS SULFATE 325 (65 FE) MG PO TABS
325.0000 mg | ORAL_TABLET | Freq: Every day | ORAL | Status: DC
  Administered 2019-11-06: 325 mg via ORAL
  Filled 2019-11-06: qty 1

## 2019-11-06 MED ORDER — HYDROCODONE-ACETAMINOPHEN 5-325 MG PO TABS: 1.0000 | ORAL_TABLET | Freq: Four times a day (QID) | ORAL | 0 refills | Status: DC | PRN

## 2019-11-06 MED ORDER — HYDROCODONE-ACETAMINOPHEN 5-325 MG PO TABS: 1.0000 | ORAL_TABLET | Freq: Four times a day (QID) | ORAL | 0 refills | Status: AC | PRN

## 2019-11-06 MED ORDER — FERROUS SULFATE 325 (65 FE) MG PO TABS: 325.0000 mg | ORAL_TABLET | Freq: Every day | ORAL | 3 refills | Status: DC

## 2019-11-06 NOTE — Progress Notes (Signed)
Subjective:   Kenneth Khan was examined and evaluated at bedside this AM. He mentions having no complaints. Discussed plan to discharge today with close f/u. Kenneth Khan expressed understanding and is agreeable to the plan.  Consults: heme/onc  Objective:  Vital signs in last 24 hours: Vitals:   11/05/19 2057 11/06/19 0026 11/06/19 0600 11/06/19 0745  BP: 133/77 116/73 120/70 113/63  Pulse: (!) 115 93 (!) 106 (!) 109  Resp: _0 Temp: 98.4 F (36.9 C) 99 F (37.2 C) 99.1 F (37.3 C) 99.5 F (37.5 C)  TempSrc: Oral Oral Oral Oral  SpO2: 99% 96% 97% 96%  Weight:      Height:        Physical Exam  Constitutional: He is well-developed, well-nourished, and in no distress. No distress.  Cardiovascular: Regular rhythm and normal heart sounds.  No murmur heard. tachy  Pulmonary/Chest: Effort normal. No respiratory distress.  Abdominal: Soft. Bowel sounds are normal. He exhibits no distension.  Neurological: He is alert.  Skin: Skin is warm and dry. There is pallor.  Psychiatric: Affect normal.  Nursing note and vitals reviewed.  I/Os:  Intake/Output Summary (Last 24 hours) at 11/06/2019 1006 Last data filed at 11/06/2019 0029 Gross per 24 hour  Intake 13.52 ml  Output 700 ml  Net -686.48 ml    Labs: Results for orders placed or performed during the hospital encounter of 11/03/19 (from the past 24 hour(s))  Sedimentation rate     Status: Abnormal   Collection Time: 11/05/19 10:47 AM  Result Value Ref Range   Sed Rate 46 (H) 0 - 16 mm/hr  C-reactive protein     Status: Abnormal   Collection Time: 11/05/19 10:47 AM  Result Value Ref Range   CRP 13.6 (H) <1.0 mg/dL  CBC     Status: Abnormal   Collection Time: 11/06/19  5:57 AM  Result Value Ref Range   WBC 16.2 (H) 4.0 - 10.5 K/uL   RBC 3.87 (L) 4.22 - 5.81 MIL/uL   Hemoglobin 8.0 (L) 13.0 - 17.0 g/dL   HCT 26.9 (L) 39.0 - 52.0 %   MCV 69.5 (L) 80.0 - 100.0 fL   MCH 20.7 (L) 26.0 - 34.0 pg   MCHC 29.7  (L) 30.0 - 36.0 g/dL   RDW 21.5 (H) 11.5 - 15.5 %   Platelets 185 150 - 400 K/uL   nRBC 0.0 0.0 - 0.2 %  Comprehensive metabolic panel     Status: Abnormal   Collection Time: 11/06/19  5:57 AM  Result Value Ref Range   Sodium 139 135 - 145 mmol/L   Potassium 3.6 3.5 - 5.1 mmol/L   Chloride 105 98 - 111 mmol/L   CO2 23 22 - 32 mmol/L   Glucose, Bld 150 (H) 70 - 99 mg/dL   BUN 20 6 - 20 mg/dL   Creatinine, Ser 1.33 (H) 0.61 - 1.24 mg/dL   Calcium 10.0 8.9 - 10.3 mg/dL   Total Protein 5.2 (L) 6.5 - 8.1 g/dL   Albumin 2.2 (L) 3.5 - 5.0 g/dL   AST 51 (H) 15 - 41 U/L   ALT 38 0 - 44 U/L   Alkaline Phosphatase 193 (H) 38 - 126 U/L   Total Bilirubin 0.6 0.3 - 1.2 mg/dL   GFR calc non Af Amer >60 >60 mL/min   GFR calc Af Amer >60 >60 mL/min   Anion gap 11 5 - 15    Assessment/Plan:  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   Compression fracture of T12:  Metastatic disease:  Patient with right lower back pain for several weeks following mechanical injury evaluated by PCP  and noted to have significant leukocytosis and hypercalcemia. Imaging significant for compression fracture of T12 and lytic lesion in T1. CT Chest/Abdomen/Pelvis significant for a pancreatic tail mass, multiple hepatic masses, lytic lesions involving the bilateral ribs, sternum and right iliac bone consistent with diffuse metastatic disease.  -Tumor markers in process. PSA wnl. INR slightly elevated to 1.3, LDH wnl at 110. CEA elevated at 22.3; AFP and CA 19-9 pending  -NM Bone scan show lytic lesion in proximal left femur putting patient at risk for pathologic fracture. Ortho consulted but no ortho onc at this hospital so they recommend transfer to tertiary care center. Per discussion with onc, given  patient without sx from that lytic lesion, will address outpatient.  -US guided liver biopsy yesterday with path pending  Plan: -dc with norco for pain control  -fu with onc outpatient  Hypercalcemia:  Ca 13.8 on admission (14.8 on PCP's labs prior to admission). This is likely secondary to his malignancy. He denies any abdominal pain, nausea/vomiting, muscle weakness. Ca down to 10 today, likely around 11 with correction for low albumin. S/p IV zolendronic acid and fluids.   Plan: -fu outpatient   Acute microcytic anemia:  Hb 10.4 on admission (Baseline 15.7) with an MCV of 70.1. Iron deficiency anemia vs anemia of chronic disease. He has not had a colonoscopy. Patient denies any hematochezia or changes in stools. He does note increasing fatigue over the past several months with 20lb weight loss and is noted to have pallor on examination.  -Hgb down to 8.0 today which may be secondary to dilution from all the fluids for his hypercalcemia given all cell lines down.  -Iron panel shows low iron of 7, low saturation ratio of 2, normal TIBC of 309 and normal ferritin of 307. Per onc, ferritin likely falsely elevated due to malignancy given other inflammatory markers such as ESR/CRP are elevated.  -GI consulted who believes anemia to be multifactorial and finds no indication for further inpatient work up   Plan: -iron supplementation, 325 mg daily, with orange juice  Leukocytosis:  Fever: WBC 34 on admission. This is likely due to patient's malignancy and in setting of recent steroid use. WBC down to 16 in setting of lots of IV hydration due to hypercalcemia. Pt spiked fever > 24 hours ago. UA unremarkable. Bld cxs drawn with NGTD. Likely due to metastatic disease per oncology.  History of hypertension: Patient is on lisinopril-HCTZ 20-12.35m daily. Normotensive on admission.  - Holding due to normotension and hypercalcemia   History of hyperlipidemia: - Continue pravastatin 461mqd  -  Continue fenofibrate 543md  Dispo: Anticipated discharge today.  LanAl DecantD 11/06/2019, 10:06 AM Pager: [phone]

## 2019-11-06 NOTE — Progress Notes (Signed)
TN gave pt discharge instructions pt stated understanding. Pt stated that his medications got sent to the wrong pharmacy RN contacted IM and spoke to Dr.Lee and got it changed.

## 2019-11-06 NOTE — Discharge Instructions (Signed)
You were admitted to the hospital for back pain and high calcium levels and workup for these conditions demonstrated a likely cancer that has spread to other parts of your body causing these signs and symptoms. You were treated with medications to lower your calcium and treated with pain medications to control your back pain. You received multiple images of your body as we looked for the extent of the likely cancer spread. You underwent a biopsy of one of the spots in your liver so that we could confirm the origin. That result is still pending but should be available at your follow up with your oncologist. You were also found to have low iron stores and were evaluated by our GI doctors. You were started on iron supplementation for your iron deficiency. You should follow up with your primary care doctor within the week and follow up with the oncologist in the first week of January. You are being prescribed medication for your pain that should help you manage your symptoms until follow up with your oncologist.

## 2019-11-06 NOTE — Discharge Summary (Signed)
Name: Kenneth Khan. MRN: [id number] DOB: 09-21-1966 53 y.o. PCP: Nicholes Rough, PA-C  Date of Admission: 11/03/2019  6:49 AM Date of Discharge:  11/06/19 Attending Physician: Oda Kilts, MD  Discharge Diagnosis: 1. Compression fracture of T12/Metastatic disease 2. Hypercalcemia 3. Acute microcytic anemia 4. Leukocytosis/Fever  Discharge Medications: Allergies as of 11/06/2019   No Known Allergies     Medication List    STOP taking these medications   predniSONE 10 MG (21) Tbpk tablet Commonly known as: STERAPRED UNI-PAK 21 TAB     TAKE these medications   aspirin EC 81 MG tablet Take 81 mg by mouth daily.   fenofibrate 54 MG tablet Take 54 mg by mouth daily.   ferrous sulfate 325 (65 FE) MG tablet Take 1 tablet (325 mg total) by mouth daily with breakfast. Take with orange juice.   HYDROcodone-acetaminophen 5-325 MG tablet Commonly known as: NORCO/VICODIN Take 1-2 tablets by mouth every 6 (six) hours as needed for up to 14 days for moderate pain. What changed: how much to take   lisinopril-hydrochlorothiazide 20-12.5 MG tablet Commonly known as: ZESTORETIC Do not use again until instructed by your doctor. What changed:   how much to take  how to take this  when to take this  additional instructions   methocarbamol 750 MG tablet Commonly known as: ROBAXIN Take 750 mg by mouth 3 (three) times daily.   pravastatin 40 MG tablet Commonly known as: PRAVACHOL Take 40 mg by mouth daily.       Disposition and follow-up:   Mr.Kenneth Khan. was discharged from [facility] in Stable condition.  At the hospital follow up visit please address:   1.  Please ensure patient's pain is being managed. Please ensure adherence to iron supplementation. Please be sure to update patient on liver biopsy findings.   2.  Labs / imaging needed at time of follow-up: CBC to fu Hgb, CMP to check calcium; likely will need outpatient PET  3.   Pending labs/ test needing follow-up: liver biopsy  Follow-up Appointments: Follow-up Information    Nicholes Rough, PA-C Follow up in 1 week(s).   Specialty: Physician Assistant Contact information: [place] [address] [phone]           Hospital Course by problem list:  1. Compression fracture of T12/Metastatic disease: Patient with right lower back pain for several weeks following mechanical injury evaluated by PCP and noted to have significant leukocytosis and hypercalcemia. Imaging significant for compression fracture of T12 and lytic lesion in T1. CT Chest/Abdomen/Pelvis significant for a pancreatic tail mass, multiple hepatic masses, lytic lesions involving the bilateral ribs, sternum and right iliac bone consistent with diffuse metastatic disease.  -Tumor markers in process. PSA wnl. INR slightly elevated to 1.3, LDH wnl at 110. CEA elevated at 22.3; AFP and CA 19-9 pending  -NM Bone scan show lytic lesion in proximal left femur putting patient at risk for pathologic fracture. Ortho consulted but no ortho onc at this hospital so they recommend transfer to tertiary care center. Per discussion with onc, given patient without sx from that lytic lesion, will address outpatient.  -US guided liver biopsy yesterday with path pending  Plan: -dc with norco for pain control  -fu with onc outpatient  2. Hypercalcemia Ca 13.8 on admission (14.8 on PCP's labs prior to admission). This is likely secondary to his malignancy. He denies any abdominal pain, nausea/vomiting, muscle weakness. Ca down to 10  today, likely around 11 with correction for low albumin. S/p IV zolendronic acid and fluids.   Plan: -fu outpatient   3. Acute microcytic anemia Hb 10.4 on admission (Baseline 15.7) with an MCV of 70.1. Iron deficiency anemia vs anemia of chronic disease. He has not had a colonoscopy. Patient denies any hematochezia or changes in stools. He does note increasing fatigue  over the past several months with 20lb weight loss and is noted to have pallor on examination.  -Hgb down to 8.0 today which may be secondary to dilution from all the fluids for his hypercalcemia given all cell lines down.  -Iron panel shows low iron of 7, low saturation ratio of 2, normal TIBC of 309 and normal ferritin of 307. Per onc, ferritin likely falsely elevated due to malignancy given other inflammatory markers such as ESR/CRP are elevated.  -GI consulted who believes anemia to be multifactorial and finds no indication for further inpatient work up   Plan: -iron supplementation, 325 mg daily, with orange juice  4. Leukocytosis/Fever WBC 34 on admission. This is likely due to patient's malignancy and in setting of recent steroid use. WBC down to 16 in setting of lots of IV hydration due to hypercalcemia. Pt spiked fever > 24 hours ago. UA unremarkable. Bld cxs drawn with NGTD. Likely due to metastatic disease per oncology.  Discharge Vitals:   BP 113/63 (BP Location: Left Arm)   Pulse (!) 109   Temp 99.5 F (37.5 C) (Oral)   Resp 18   Ht 5' 10" (1.778 m)   Wt 83.9 kg   SpO2 96%   BMI 26.54 kg/m   Pertinent Labs, Studies, and Procedures:   CRP <1.0 mg/dL 13.6High     Sed Rate 0 - 16 mm/hr 46High     Iron 45 - 182 ug/dL 7Low    TIBC 250 - 450 ug/dL 309   Saturation Ratios 17.9 - 39.5 % 2Low    UIBC ug/dL 302    Ferritin 24 - 336 ng/mL 307    LDH 98 - 192 U/L 110    aPTT 24 - 36 seconds 32    Prothrombin Time 11.4 - 15.2 seconds 15.7High    INR 0.8 - 1.2 1.3High     TSH 0.350 - 4.500 uIU/mL 2.281    T4, Total 4.5 - 12.0 ug/dL 8.3    PTH 15 - 65 pg/mL 10Low     CEA 0.0 - 4.7 ng/mL 22.3High     Prostatic Specific Antigen 0.00 - 4.00 ng/mL 0.38    HIV Screen 4th Generation wRfx NON REACTIVE NON REACTIVE     CMP Latest Ref Rng & Units 11/06/2019 11/04/2019 11/04/2019  Glucose 70 - 99 mg/dL 150(H) 149(H) 115(H)  BUN 6 - 20 mg/dL 20 28(H) 32(H)  Creatinine  0.61 - 1.24 mg/dL 1.33(H) 1.47(H) 1.52(H)  Sodium 135 - 145 mmol/L 139 140 139  Potassium 3.5 - 5.1 mmol/L 3.6 4.0 4.1  Chloride 98 - 111 mmol/L 105 109 107  CO2 22 - 32 mmol/L 23 23 25  Calcium 8.9 - 10.3 mg/dL 10.0 11.1(H) 12.2(H)  Total Protein 6.5 - 8.1 g/dL 5.2(L) - 5.7(L)  Total Bilirubin 0.3 - 1.2 mg/dL 0.6 - 0.8  Alkaline Phos 38 - 126 U/L 193(H) - 145(H)  AST 15 - 41 U/L 51(H) - 32  ALT 0 - 44 U/L 38 - 28    CBC Latest Ref Rng & Units 11/06/2019 11/04/2019 11/03/2019  WBC 4.0 - 10.5 K/uL 16.2(H) 18.3(H)   34.0(H)  Hemoglobin 13.0 - 17.0 g/dL 8.0(L) 8.9(L) 10.4(L)  Hematocrit 39.0 - 52.0 % 26.9(L) 30.2(L) 35.8(L)  Platelets 150 - 400 K/uL 185 279 566(H)   LUMBAR SPINE - COMPLETE 4+ VIEW IMPRESSION: Age-indeterminate T12 superior endplate fracture with mild height loss. Considering the patient's calcium levels, suggests CT or MRI to exclude an underlying lesion.  CHEST - 2 VIEW IMPRESSION: Negative chest.  CT THORACIC AND LUMBAR SPINE WITHOUT CONTRAST IMPRESSION: 1. Metastatic pattern with infiltrative lesions in the T1 body, T12 body, and left posterior fifth rib. Pathologic fracture at T12 with moderate central height loss and probable ventral epidural tumor extension. 2. Partially covered abdomen shows pancreatic tail mass and extensive hepatic metastatic disease.  CT CHEST, ABDOMEN, AND PELVIS WITH CONTRAST IMPRESSION: 1. 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 Innumerable hepatic masses, mildly enlarged abdominal lymph nodes and lytic lesions involving T1, bilateral ribs, manubrium, RIGHT iliac bone and T12 noted compatible with diffuse metastatic disease. 50% pathologic fracture of T12. 2. Cholelithiasis without CT evidence of acute cholecystitis.  NUCLEAR MEDICINE WHOLE BODY BONE SCAN IMPRESSION: 1. The apparent bony metastatic disease on the CT scan from November 03, 2019 is almost entirely lytic. Sensitivity of a nuclear  medicine bone scan for lytic lesions is very limited. Nuclear medicine bone scans are much better in the evaluation of blastic metastases. Lytic metastases would be better assessed with an MRI or PET-CT. 2. There is a lytic lesion in the left femoral neck on the comparison CT scan which is not appreciated on this study. However, there is mild increased uptake in the proximal left femoral diaphysis concerning for a metastatic lesion, not well assessed on the recent CT scan. The lytic lesions in the proximal left femur put the patient at risk for pathologic femoral fracture. 3. Soft tissue uptake in the right upper quadrant is consistent with known liver metastases. 4. Soft tissue uptake superior to left kidney likely correlates with the patient's pancreatic mass. 5. There is uptake over multiple anterior right ribs. The amount of uptake is out of proportion to the single lytic lesion seen on CT imaging. Bony metastatic disease in several anterior right ribs without CT correlate is possible. However, this uptake could be partially explained by summation of uptake in the right anterior ribs and the underlying liver metastases. 6. There is a metastatic lesion in a posterior right rib not seen on CT imaging. 7. The apparent lytic metastases in left-sided ribs on the comparison CT scan is not appreciated on this study. 8. The apparent lytic metastasis in the left iliac bone on the recent CT scan is not appreciated on this study. 9. The suspected metastatic disease in T1 and T12 is not appreciated on this study. The known compression fracture at T12 is also not visualized on this study. 10. The mild uptake in the left periorbital region and right mastoid are nonspecific.  MRI THORACIC WITHOUT AND WITH CONTRAST IMPRESSION: Metastatic lesions at T1, T12, and L1. There is moderate compression deformity of T12 with mild ventral epidural extension but no cord compression. Additional diffusely  abnormal marrow signal may reflect hematopoietic marrow given anemia or possibly more infiltrative metastatic disease.  Discharge Instructions: Discharge Instructions    Activity as tolerated - No restrictions   Complete by: As directed    Diet general   Complete by: As directed       Signed: Al Decant, MD 11/06/2019, 10:45 AM   Pager: [phone]

## 2019-11-06 NOTE — Progress Notes (Signed)
[place] [phone]   [phone]  PROGRESS NOTE  Patient Care Team: Randa Evens as PCP - General (Physician Assistant)  Hematological/Oncological History # Imaging Concerning for Metastatic Disease  1) 11/03/2019: presented to [name] after seeing his PCP the day prior. Noted to have lab abnormalities. CT Thoracic Spine/Lumbar shows lesion to T1 and compression fracture of T12. CT C/A/P showed 4 x 5 cm ill-defined hypoechoic mass within the pancreatic tail and innumerable low-density lesions throughout the liver noted and compatible with metastatic disease. Noted to have microcytic anemia and hypercalcemia to 13.8.  2) 11/05/2019: underwent US guided biopsy of liver lesion. Pathology pending.   Interval History:  Kenneth Khan. 53 y.o. male with medical history significant for metastatic tumor currently undergoing biopsy and evaluation.  The patient reports that his pain is well controlled at this time.  He is awaiting discharge to home.  They are working on getting his prescriptions sorted out.  He had a low-grade fever 99.5 earlier this morning.  He denies any infectious symptoms.  His appetite remains good and his bowels are moving well.  He does not notice any bleeding. Full 10 point ROS is listed below.  MEDICAL HISTORY:  Past Medical History:  Diagnosis Date   Hypertension     SURGICAL HISTORY: History reviewed. No pertinent surgical history.  SOCIAL HISTORY: Social History   Socioeconomic History   Marital status: Single    Spouse name: Not on file   Number of children: Not on file   Years of education: Not on file   Highest education level: Not on file  Occupational History   Not on file  Tobacco Use   Smoking status: Never Smoker   Smokeless tobacco: Never Used  Substance and Sexual Activity   Alcohol use: Yes   Drug use: Never   Sexual activity: Not on file  Other Topics Concern   Not on file   Social History Narrative   Not on file   Social Determinants of Health   Financial Resource Strain:    Difficulty of Paying Living Expenses: Not on file  Food Insecurity:    Worried About [place] in the Last Year: Not on file   Ran Out of Food in the Last Year: Not on file  Transportation Needs:    Lack of Transportation (Medical): Not on file   Lack of Transportation (Non-Medical): Not on file  Physical Activity:    Days of Exercise per Week: Not on file   Minutes of Exercise per Session: Not on file  Stress:    Feeling of Stress : Not on file  Social Connections:    Frequency of Communication with Friends and Family: Not on file   Frequency of Social Gatherings with Friends and Family: Not on file   Attends Religious Services: Not on file   Active Member of Clubs or Organizations: Not on file   Attends Archivist Meetings: Not on file   Marital Status: Not on file  Intimate Partner Violence:    Fear of Current or Ex-Partner: Not on file   Emotionally Abused: Not on file   Physically Abused: Not on file   Sexually Abused: Not on file    FAMILY HISTORY: History reviewed. No pertinent family history.  ALLERGIES:  has No Known Allergies.  MEDICATIONS:  Current Facility-Administered Medications  Medication Dose Route Frequency Provider Last Rate Last Admin   acetaminophen (TYLENOL) tablet 325-650 mg  325-650 mg Oral Q6H PRN Jean Rosenthal, MD   650 mg at 11/04/19 1718   aspirin EC tablet 81 mg  81 mg Oral Daily Harvie Heck, MD   81 mg at 11/06/19 0823   enoxaparin (LOVENOX) injection 40 mg  40 mg Subcutaneous Q24H Ardis Rowan, PA-C       fenofibrate tablet 54 mg  54 mg Oral Daily Harvie Heck, MD   54 mg at 11/06/19 [id number]   ferrous sulfate tablet 325 mg  325 mg Oral Q breakfast Al Decant, MD   325 mg at 11/06/19 [id number]   HYDROcodone-acetaminophen (NORCO/VICODIN) 5-325 MG per tablet 1 tablet  1 tablet Oral Q6H PRN  Harvie Heck, MD   1 tablet at 11/05/19 1029   HYDROmorphone (DILAUDID) injection 1 mg  1 mg Intravenous Q6H PRN Maudie Mercury, MD   1 mg at 11/06/19 1136   morphine 4 MG/ML injection 4 mg  4 mg Intravenous Q4H PRN Harvie Heck, MD   4 mg at 11/03/19 1136   ondansetron (ZOFRAN) injection 4 mg  4 mg Intravenous Q6H PRN Harvie Heck, MD   4 mg at 11/03/19 1136   pravastatin (PRAVACHOL) tablet 40 mg  40 mg Oral Daily Harvie Heck, MD   40 mg at 11/06/19 [id number]    REVIEW OF SYSTEMS:   Constitutional: ( - ) fevers, ( - )  chills , ( - ) night sweats Eyes: ( - ) blurriness of vision, ( - ) double vision, ( - ) watery eyes Ears, nose, mouth, throat, and face: ( - ) mucositis, ( - ) sore throat Respiratory: ( - ) cough, ( - ) dyspnea, ( - ) wheezes Cardiovascular: ( - ) palpitation, ( - ) chest discomfort, ( - ) lower extremity swelling Gastrointestinal:  ( - ) nausea, ( - ) heartburn, ( - ) change in bowel habits Skin: ( - ) abnormal skin rashes Lymphatics: ( - ) new lymphadenopathy, ( - ) easy bruising Neurological: ( - ) numbness, ( - ) tingling, ( - ) new weaknesses Behavioral/Psych: ( - ) mood change, ( - ) new changes  All other systems were reviewed with the patient and are negative.  PHYSICAL EXAMINATION: ECOG PERFORMANCE STATUS: 1 - Symptomatic but completely ambulatory  Vitals:   11/06/19 0745 11/06/19 1224  BP: 113/63 123/67  Pulse: (!) 109 (!) 108  Resp: 18 18  Temp: 99.5 F (37.5 C) 98.4 F (36.9 C)  SpO2: 96% 97%   Filed Weights   11/03/19 0707  Weight: 185 lb (83.9 kg)    GENERAL: well appearing middle aged Caucasian male in NAD  SKIN: skin color, texture, turgor are normal, no rashes or significant lesions EYES: conjunctiva are pink and non-injected, sclera clear LUNGS: clear to auscultation and percussion with normal breathing effort HEART: regular rate & rhythm and no murmurs and no lower extremity edema ABDOMEN: soft, non-tender, non-distended, normal bowel  sounds Musculoskeletal: no cyanosis of digits and no clubbing  PSYCH: alert & oriented x 3, fluent speech NEURO: no focal motor/sensory deficits  LABORATORY DATA:  I have reviewed the data as listed Lab Results  Component Value Date   WBC 16.2 (H) 11/06/2019   HGB 8.0 (L) 11/06/2019   HCT 26.9 (L) 11/06/2019   MCV 69.5 (L) 11/06/2019   PLT 185 11/06/2019   NEUTROABS 30.0 (H) 11/03/2019    RADIOGRAPHIC STUDIES: I have personally reviewed the radiological images as listed and agreed with the findings in the report.  DG Chest 2 View  Result Date: 11/03/2019 CLINICAL DATA:  Abnormal labs EXAM: CHEST - 2 VIEW COMPARISON:  None. FINDINGS: Normal heart size and mediastinal contours. No acute infiltrate or edema. No effusion or pneumothorax. No acute osseous findings. Artifact from EKG leads IMPRESSION: Negative chest. Electronically Signed   By: Monte Fantasia M.D.   On: 11/03/2019 09:14   DG Lumbar Spine Complete  Result Date: 11/03/2019 CLINICAL DATA:  Abnormal labs.  Back pain EXAM: LUMBAR SPINE - COMPLETE 4+ VIEW COMPARISON:  None. FINDINGS: T12 superior endplate fracture with mild height loss, age-indeterminate. No clear erosion, although question whether the right pedicle at T12 is more lucent than the others. Spondylitic spurring greatest at L2-3 where there is mild disc narrowing. No evidence of fracture or bone lesion. IMPRESSION: Age-indeterminate T12 superior endplate fracture with mild height loss. Considering the patient's calcium levels, suggests CT or MRI to exclude an underlying lesion. Electronically Signed   By: Monte Fantasia M.D.   On: 11/03/2019 08:31   CT Chest W Contrast  Result Date: 11/03/2019 CLINICAL DATA:  53 year old male with probable pathologic spine fracture identified on recent radiographs. Evaluate for malignancy/metastatic disease. EXAM: CT CHEST, ABDOMEN, AND PELVIS WITH CONTRAST TECHNIQUE: Multidetector CT imaging of the chest, abdomen and pelvis was  performed following the standard protocol during bolus administration of intravenous contrast. CONTRAST:  22mL OMNIPAQUE IOHEXOL 300 MG/ML  SOLN COMPARISON:  11/03/2019 lumbar spine radiographs FINDINGS: CT CHEST FINDINGS Cardiovascular: Heart size is normal. No thoracic aortic aneurysm or pericardial effusion. Mediastinum/Nodes: Shotty mediastinal and RIGHT juxta pericardial lymph nodes are noted. No mediastinal mass identified. The esophagus trachea and visualized thyroid are unremarkable. Lungs/Pleura: Mild bibasilar and dependent atelectasis identified. No evidence of pulmonary nodule, mass, airspace disease, consolidation, pleural effusion or pneumothorax. Musculoskeletal: Lytic lesions involving the T1 vertebral body, RIGHT 8th rib, LEFT 5th and 6th ribs and manubrium are noted compatible with bony metastatic disease. No acute fracture identified. CT ABDOMEN PELVIS FINDINGS Hepatobiliary: Innumerable low-density lesions throughout the liver noted and compatible with metastatic disease. No definite biliary dilatation identified. Cholelithiasis noted without CT evidence of acute cholecystitis. Pancreas: A 4 x 5 cm ill-defined hypoechoic mass within the pancreatic tail is identified, compatible with malignancy. This mass has a large contact surface with the splenic artery. The remainder of the pancreas is unremarkable. No pancreatic ductal dilatation. Spleen: Mild splenomegaly identified. No definite focal hepatic splenic lesions are noted. Adrenals/Urinary Tract: The kidneys, adrenal glands and bladder are unremarkable. Stomach/Bowel: Stomach is within normal limits. Appendix appears normal. No evidence of bowel wall thickening, distention, or inflammatory changes. Vascular/Lymphatic: Mildly enlarged periportal and celiac lymph nodes are present. No vascular abnormalities are noted. Reproductive: Mild prostate enlargement is noted. Other: No ascites, focal collection or pneumoperitoneum. Musculoskeletal: A 50%  compression fracture of T12 is identified with underlying lytic lesion. A probable lytic lesion within the posterior RIGHT iliac bone (image 106-series 3) is noted. IMPRESSION: 1. 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 Innumerable hepatic masses, mildly enlarged abdominal lymph nodes and lytic lesions involving T1, bilateral ribs, manubrium, RIGHT iliac bone and T12 noted compatible with diffuse metastatic disease. 50% pathologic fracture of T12. 2. Cholelithiasis without CT evidence of acute cholecystitis. Electronically Signed   By: Margarette Canada M.D.   On: 11/03/2019 14:12   CT Thoracic Spine Wo Contrast  Result Date: 11/03/2019 CLINICAL DATA:  Concern for pathologic fracture. EXAM: CT THORACIC AND LUMBAR SPINE WITHOUT CONTRAST TECHNIQUE: Multidetector CT imaging of  the thoracic and lumbar spine was performed without contrast. Multiplanar CT image reconstructions were also generated. COMPARISON:  None. FINDINGS: CT THORACIC SPINE FINDINGS Alignment: Normal Vertebrae: Extensive infiltrative lesion within the T1 body without pathologic fracture. No visible extraosseous tumor extension. Left fifth posterior rib infiltrative lesion with probable minimal subpleural extension. T12 extensive infiltrative lesion within the body, right eccentric, with comminuted body fracture that appears recent. Central height loss measures up to 60%. No significant retropulsion. Suspect there is posterior ventral epidural tumor extension at this level. Paraspinal and other soft tissues: Partially covered liver shows multiple low-density masses. There is also a partially covered mass at the left pancreatic tail. Disc levels: No significant degenerative changes CT LUMBAR SPINE FINDINGS Segmentation: 5 lumbar type vertebrae Alignment: Normal Vertebrae: Equivocal for early infiltrative lesion within the right posterior L5 body. No fracture. Paraspinal and other soft tissues: As above Disc levels: No  significant degenerative changes or visible impingement IMPRESSION: 1. Metastatic pattern with infiltrative lesions in the T1 body, T12 body, and left posterior fifth rib. Pathologic fracture at T12 with moderate central height loss and probable ventral epidural tumor extension. 2. Partially covered abdomen shows pancreatic tail mass and extensive hepatic metastatic disease. Electronically Signed   By: Monte Fantasia M.D.   On: 11/03/2019 10:08   CT Lumbar Spine Wo Contrast  Result Date: 11/03/2019 CLINICAL DATA:  Concern for pathologic fracture. EXAM: CT THORACIC AND LUMBAR SPINE WITHOUT CONTRAST TECHNIQUE: Multidetector CT imaging of the thoracic and lumbar spine was performed without contrast. Multiplanar CT image reconstructions were also generated. COMPARISON:  None. FINDINGS: CT THORACIC SPINE FINDINGS Alignment: Normal Vertebrae: Extensive infiltrative lesion within the T1 body without pathologic fracture. No visible extraosseous tumor extension. Left fifth posterior rib infiltrative lesion with probable minimal subpleural extension. T12 extensive infiltrative lesion within the body, right eccentric, with comminuted body fracture that appears recent. Central height loss measures up to 60%. No significant retropulsion. Suspect there is posterior ventral epidural tumor extension at this level. Paraspinal and other soft tissues: Partially covered liver shows multiple low-density masses. There is also a partially covered mass at the left pancreatic tail. Disc levels: No significant degenerative changes CT LUMBAR SPINE FINDINGS Segmentation: 5 lumbar type vertebrae Alignment: Normal Vertebrae: Equivocal for early infiltrative lesion within the right posterior L5 body. No fracture. Paraspinal and other soft tissues: As above Disc levels: No significant degenerative changes or visible impingement IMPRESSION: 1. Metastatic pattern with infiltrative lesions in the T1 body, T12 body, and left posterior fifth rib.  Pathologic fracture at T12 with moderate central height loss and probable ventral epidural tumor extension. 2. Partially covered abdomen shows pancreatic tail mass and extensive hepatic metastatic disease. Electronically Signed   By: Monte Fantasia M.D.   On: 11/03/2019 10:08   MR THORACIC SPINE W WO CONTRAST  Result Date: 11/05/2019 CLINICAL DATA:  Metastatic disease with pathologic fracture EXAM: MRI THORACIC WITHOUT AND WITH CONTRAST TECHNIQUE: Multiplanar and multiecho pulse sequences of the thoracic spine were obtained without and with intravenous contrast. CONTRAST:  44mL GADAVIST GADOBUTROL 1 MMOL/ML IV SOLN COMPARISON:  Correlation made with recent CT imaging FINDINGS: MRI THORACIC SPINE FINDINGS Motion degraded. Alignment:  No significant listhesis. Vertebrae: There is abnormal STIR hyperintensity and enhancement at T1, T12, and L1 vertebral bodies. This is superimposed on diffusely abnormal marrow signal with more patchy STIR hyperintensity and enhancement. Compression fracture of T12 is again identified with 50-60% loss of height. There is mild osseous retropulsion with ventral epidural extension of  disease partially effacing the ventral subarachnoid space. Small left T10 lesion may reflect a hemangioma Cord: [place] frequent postnasal drip is calculi. There is no abnormal cord signal. Paraspinal and other soft tissues: Multiple hepatic metastases. Paraspinal soft tissues are unremarkable. Abnormal marrow signal also involves the ribs, which are not as well evaluated due to motion artifact. Disc levels: Intervertebral disc heights and signal are maintained. There is no significant degenerative stenosis. IMPRESSION: Metastatic lesions at T1, T12, and L1. There is moderate compression deformity of T12 with mild ventral epidural extension but no cord compression. Additional diffusely abnormal marrow signal may reflect hematopoietic marrow given anemia or possibly more infiltrative metastatic disease.  Electronically Signed   By: Macy Mis M.D.   On: 11/05/2019 20:56   NM Bone Scan Whole Body  Result Date: 11/04/2019 CLINICAL DATA:  Evaluate for bony metastatic disease. EXAM: NUCLEAR MEDICINE WHOLE BODY BONE SCAN TECHNIQUE: Whole body anterior and posterior images were obtained approximately 3 hours after intravenous injection of radiopharmaceutical. RADIOPHARMACEUTICALS:  21.5 mCi Technetium-45m MDP IV COMPARISON:  CT scan of the chest, abdomen, and pelvis November 03, 2019 FINDINGS: The patient's bony metastatic disease seen on yesterday's CT scan is almost entirely lytic which limits evaluation with a nuclear medicine bone scan. There is a focus of uptake in the left periorbital region, slightly asymmetric to the right. There is greater uptake in the right mastoid compared to the left. There is uptake in several anterior right ribs and and at least 1 posterior right rib. No definitive abnormal uptake in the left ribs, upper extremities, or sternum. The known lucent lesion in T1 on the CT scan is not appreciated on this study. The patient's known compression fracture of T12 is not appreciated on this study. The lytic lesion in the left femoral neck is not appreciated on this study. There does appear to be some mild increased uptake in the proximal left femoral diaphysis however. The apparent lytic lesion within the left iliac bone on the recent CT scan on image 106 demonstrates no correlate on this study. No other abnormalities in the lower extremities. Increased uptake in the soft tissues of the right upper quadrant are consistent with the patient's known liver metastases. Uptake within the kidneys is normal. Slight increased soft tissue uptake superior to the left kidney correlates with the known pancreatic mass. No other soft tissue abnormalities identified. IMPRESSION: 1. The apparent bony metastatic disease on the CT scan from November 03, 2019 is almost entirely lytic. Sensitivity of a nuclear  medicine bone scan for lytic lesions is very limited. Nuclear medicine bone scans are much better in the evaluation of blastic metastases. Lytic metastases would be better assessed with an MRI or PET-CT. 2. There is a lytic lesion in the left femoral neck on the comparison CT scan which is not appreciated on this study. However, there is mild increased uptake in the proximal left femoral diaphysis concerning for a metastatic lesion, not well assessed on the recent CT scan. The lytic lesions in the proximal left femur put the patient at risk for pathologic femoral fracture. 3. Soft tissue uptake in the right upper quadrant is consistent with known liver metastases. 4. Soft tissue uptake superior to left kidney likely correlates with the patient's pancreatic mass. 5. There is uptake over multiple anterior right ribs. The amount of uptake is out of proportion to the single lytic lesion seen on CT imaging. Bony metastatic disease in several anterior right ribs without CT correlate is possible. However,  this uptake could be partially explained by summation of uptake in the right anterior ribs and the underlying liver metastases. 6. There is a metastatic lesion in a posterior right rib not seen on CT imaging. 7. The apparent lytic metastases in left-sided ribs on the comparison CT scan is not appreciated on this study. 8. The apparent lytic metastasis in the left iliac bone on the recent CT scan is not appreciated on this study. 9. The suspected metastatic disease in T1 and T12 is not appreciated on this study. The known compression fracture at T12 is also not visualized on this study. 10. The mild uptake in the left periorbital region and right mastoid are nonspecific. These results will be called to the ordering clinician or representative by the Radiologist Assistant, and communication documented in the PACS or zVision Dashboard. Electronically Signed   By: Dorise Bullion III M.D   On: 11/04/2019 14:11   CT Abdomen  Pelvis W Contrast  Result Date: 11/03/2019 CLINICAL DATA:  53 year old male with probable pathologic spine fracture identified on recent radiographs. Evaluate for malignancy/metastatic disease. EXAM: CT CHEST, ABDOMEN, AND PELVIS WITH CONTRAST TECHNIQUE: Multidetector CT imaging of the chest, abdomen and pelvis was performed following the standard protocol during bolus administration of intravenous contrast. CONTRAST:  38mL OMNIPAQUE IOHEXOL 300 MG/ML  SOLN COMPARISON:  11/03/2019 lumbar spine radiographs FINDINGS: CT CHEST FINDINGS Cardiovascular: Heart size is normal. No thoracic aortic aneurysm or pericardial effusion. Mediastinum/Nodes: Shotty mediastinal and RIGHT juxta pericardial lymph nodes are noted. No mediastinal mass identified. The esophagus trachea and visualized thyroid are unremarkable. Lungs/Pleura: Mild bibasilar and dependent atelectasis identified. No evidence of pulmonary nodule, mass, airspace disease, consolidation, pleural effusion or pneumothorax. Musculoskeletal: Lytic lesions involving the T1 vertebral body, RIGHT 8th rib, LEFT 5th and 6th ribs and manubrium are noted compatible with bony metastatic disease. No acute fracture identified. CT ABDOMEN PELVIS FINDINGS Hepatobiliary: Innumerable low-density lesions throughout the liver noted and compatible with metastatic disease. No definite biliary dilatation identified. Cholelithiasis noted without CT evidence of acute cholecystitis. Pancreas: A 4 x 5 cm ill-defined hypoechoic mass within the pancreatic tail is identified, compatible with malignancy. This mass has a large contact surface with the splenic artery. The remainder of the pancreas is unremarkable. No pancreatic ductal dilatation. Spleen: Mild splenomegaly identified. No definite focal hepatic splenic lesions are noted. Adrenals/Urinary Tract: The kidneys, adrenal glands and bladder are unremarkable. Stomach/Bowel: Stomach is within normal limits. Appendix appears normal. No  evidence of bowel wall thickening, distention, or inflammatory changes. Vascular/Lymphatic: Mildly enlarged periportal and celiac lymph nodes are present. No vascular abnormalities are noted. Reproductive: Mild prostate enlargement is noted. Other: No ascites, focal collection or pneumoperitoneum. Musculoskeletal: A 50% compression fracture of T12 is identified with underlying lytic lesion. A probable lytic lesion within the posterior RIGHT iliac bone (image 106-series 3) is noted. IMPRESSION: 1. 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 Innumerable hepatic masses, mildly enlarged abdominal lymph nodes and lytic lesions involving T1, bilateral ribs, manubrium, RIGHT iliac bone and T12 noted compatible with diffuse metastatic disease. 50% pathologic fracture of T12. 2. Cholelithiasis without CT evidence of acute cholecystitis. Electronically Signed   By: Margarette Canada M.D.   On: 11/03/2019 14:12   US BIOPSY (LIVER)  Result Date: 11/05/2019 INDICATION: 53 year old male with imaging findings highly concerning for metastatic pancreatic adenocarcinoma. He presents for ultrasound-guided core biopsy of liver lesion to confirm. EXAM: ULTRASOUND BIOPSY CORE LIVER MEDICATIONS: None. ANESTHESIA/SEDATION: Moderate (conscious) sedation was employed during  this procedure. A total of Versed 2 mg and Fentanyl 100 mcg was administered intravenously. Moderate Sedation Time: 10 minutes. The patient's level of consciousness and vital signs were monitored continuously by radiology nursing throughout the procedure under my direct supervision. FLUOROSCOPY TIME:  None. COMPLICATIONS: None immediate. PROCEDURE: Informed written consent was obtained from the patient after a thorough discussion of the procedural risks, benefits and alternatives. All questions were addressed. Maximal Sterile Barrier Technique was utilized including caps, mask, sterile gowns, sterile gloves, sterile drape, hand hygiene and skin  antiseptic. A timeout was performed prior to the initiation of the procedure. Ultrasound was used to interrogate the liver. Numerous echogenic and centrally necrotic lesions are present throughout the hepatic parenchyma. A suitable lesion in hepatic segment 3 was identified. A suitable skin entry site was selected and marked. The overlying skin was sterilely prepped and draped in the standard fashion using chlorhexidine skin prep. Local anesthesia was attained by infiltration with 1% lidocaine. A small dermatotomy was made. Under real-time ultrasound guidance, a 17 gauge introducer needle was advanced through the liver and positioned at the margin of the mass. Multiple 18 gauge core biopsies were then coaxially obtained using the bio Pince automated biopsy device. Biopsy specimens were placed in formalin and delivered to pathology for further analysis. As the introducer needle was removed, the biopsy tract was embolized with a Gel-Foam slurry. The patient tolerated the procedure well. IMPRESSION: Ultrasound-guided core biopsy of liver lesion. Electronically Signed   By: Jacqulynn Cadet M.D.   On: 11/05/2019 17:37    ASSESSMENT & PLAN [name]. 53 y.o. male with medical history significant for HTN and HLD who presented to his PCP with back pain and was found to have marked leukocytosis and hypercalcemia.  After this he was referred to the emergency department where a CT scan of the thoracic and lumbar spine revealed lytic lesions in T1 and a compression fracture of T12.    Since his last visit yesterday, his CEA has returned and is elevated at 22.3, his PSA is normal at 0.38, and his CA 19.9 is pending.  He had an ultrasound-guided biopsy of a liver lesion performed on 11/05/2019 and the results are currently pending.  His calcium level continues to trend down and is down to 10.0 today.  He is status post IV Zometa on 11/03/2019 along with IV hydration.  He has had intermittent fevers during his  hospitalization which were low-grade this morning.  He has no clear infectious source and his leukocytosis continues to improve.  Patient has also developed a microcytic anemia with iron deficiency, likely secondary to GI loss.  Gastroenterology was consulted and recommended no endoscopic evaluation at this time.  He has been started on ferrous sulfate which he will continue as an outpatient.  We will plan to have the patient come to the cancer next week for repeat CBC to follow his anemia closely.  Will consider PRBC transfusion for hemoglobin less than 7.  We will plan for a follow-up visit in early January 2021 discussed the liver biopsy results and further plans for treatment.  #Imaging Concerning for Metastatic Disease --awaiting results of US guided biopsy of tumor in the liver.  --CEA elevated and CA 19.9 pending.  --f/u to be scheduled in outpatient setting in the first week of January 2021.   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 Fortunately patient is asymptomatic at this time -- IV zometa administered on 11/03/2019.  --We will continue to follow as  an outpatient  #Microcytic Anemia, Acute #Thrombocytosis, Acute --findings are consistent with an iron deficiency anemia, most probably from GI blood loss --appreciate GI consultation and recommendations. --He has been started on iron sulfate 325mg  daily with a source of vitamin C (Orange juice or 500 mg tablets vitamin C).  Recommend that he continue this on an outpatient basis --Recheck CBC next week and consider transfusion for hemoglobin less than 7.   #Leukocytosis, Acute #Fever --likely 2/2 to this patients malignancy, with some contribution from recent steroid taper. --neutrophilic predominance with normal differential --patient has been febrile during this admission, blood cultures collected and pending. The temperature increase may represent tumor fever, he has no signs of infection.  --continue  to monitor  All questions were answered. The patient knows to call the clinic with any problems, questions or concerns.  Mikey Bussing, DNP, AGPCNP-BC, AOCNP    11/06/2019 1:25 PM

## 2019-11-07 ENCOUNTER — Telehealth: Payer: Self-pay | Admitting: Hematology and Oncology

## 2019-11-07 DIAGNOSIS — C787 Secondary malignant neoplasm of liver and intrahepatic bile duct: Secondary | ICD-10-CM

## 2019-11-07 LAB — CANCER ANTIGEN 19-9: CA 19-9: 1607 U/mL — ABNORMAL HIGH (ref 0–35)

## 2019-11-07 NOTE — Telephone Encounter (Signed)
Scheduled appt per 12/22 sch message and 12/19 sch message - pt aware of appt date and time

## 2019-11-07 NOTE — Telephone Encounter (Signed)
Called Kenneth Khan to discuss the results of the biopsy. No answer, left message requesting call back.   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 Primary tumor as of yet is not identified, though with his iron deficiency anemia and metastatic patter our highest cause on the differential would be squamous cell of esophageal primary.  Our recommendation would be for an EGD to confirm the primary and line the patient up for starting FOLFOX vs CapeOX chemotherapy in Jan 2021. Either way the patient will require port placement. We will also request the current tissue be tested for microsatelite stability and PD-L1 status.   Plan to have patient return to clinic in early Jan 2021 with interval EGD and port placement.  [name], MD Department of Hematology/Oncology [place] at [facility] Phone: [phone] Pager: [phone] Email: [email or web address]

## 2019-11-08 ENCOUNTER — Telehealth: Payer: Self-pay | Admitting: Hematology and Oncology

## 2019-11-08 NOTE — Telephone Encounter (Signed)
Called [name] to discuss the results of his biopsy. Findings are consistent with squamous cell cancer, which is not what we were anticipating (pancreatic adeno was top of the differential). Given this, a primary tumor is not clear. Given the spread and findings, esophageal is most likely.  Talked with [name] about port placement, diagnostic EGD, and steps moving forward.  Plan to see him on 11/14/2019 for blood check and consideration of transfusion if Hgb drops further. Additionally we will see him at 8:00am on Monday 11/19/2019 to assure no new symptoms and further discussion of prognosis and treatment options.  [name], MD Department of Hematology/Oncology [place] at [facility] Phone: [phone] Pager: [phone] Email: [email or web address]

## 2019-11-09 LAB — CULTURE, BLOOD (ROUTINE X 2)
Culture: NO GROWTH
Culture: NO GROWTH

## 2019-11-12 ENCOUNTER — Other Ambulatory Visit: Payer: Self-pay | Admitting: Oncology

## 2019-11-12 DIAGNOSIS — K8689 Other specified diseases of pancreas: Secondary | ICD-10-CM

## 2019-11-13 ENCOUNTER — Telehealth: Payer: Self-pay | Admitting: Hematology and Oncology

## 2019-11-13 NOTE — Telephone Encounter (Signed)
Called pt per 12/22 ,12/19  sch message pt aware of apt date and time for 12/30 apts.

## 2019-11-14 ENCOUNTER — Inpatient Hospital Stay: Payer: Managed Care, Other (non HMO)

## 2019-11-14 ENCOUNTER — Other Ambulatory Visit: Payer: Managed Care, Other (non HMO)

## 2019-11-14 ENCOUNTER — Other Ambulatory Visit: Payer: Self-pay | Admitting: *Deleted

## 2019-11-14 ENCOUNTER — Other Ambulatory Visit: Payer: Self-pay

## 2019-11-14 ENCOUNTER — Inpatient Hospital Stay: Payer: Managed Care, Other (non HMO) | Attending: Hematology and Oncology

## 2019-11-14 DIAGNOSIS — K8689 Other specified diseases of pancreas: Secondary | ICD-10-CM

## 2019-11-14 DIAGNOSIS — K869 Disease of pancreas, unspecified: Secondary | ICD-10-CM | POA: Insufficient documentation

## 2019-11-14 LAB — CBC WITH DIFFERENTIAL (CANCER CENTER ONLY)
Abs Immature Granulocytes: 0.2 10*3/uL — ABNORMAL HIGH (ref 0.00–0.07)
Basophils Absolute: 0.1 10*3/uL (ref 0.0–0.1)
Basophils Relative: 0 %
Eosinophils Absolute: 0.1 10*3/uL (ref 0.0–0.5)
Eosinophils Relative: 0 %
HCT: 28.4 % — ABNORMAL LOW (ref 39.0–52.0)
Hemoglobin: 8.5 g/dL — ABNORMAL LOW (ref 13.0–17.0)
Immature Granulocytes: 1 %
Lymphocytes Relative: 3 %
Lymphs Abs: 0.7 10*3/uL (ref 0.7–4.0)
MCH: 20.9 pg — ABNORMAL LOW (ref 26.0–34.0)
MCHC: 29.9 g/dL — ABNORMAL LOW (ref 30.0–36.0)
MCV: 69.8 fL — ABNORMAL LOW (ref 80.0–100.0)
Monocytes Absolute: 1.3 10*3/uL — ABNORMAL HIGH (ref 0.1–1.0)
Monocytes Relative: 5 %
Neutro Abs: 21 10*3/uL — ABNORMAL HIGH (ref 1.7–7.7)
Neutrophils Relative %: 91 %
Platelet Count: 281 10*3/uL (ref 150–400)
RBC: 4.07 MIL/uL — ABNORMAL LOW (ref 4.22–5.81)
RDW: 22.6 % — ABNORMAL HIGH (ref 11.5–15.5)
WBC Count: 23.3 10*3/uL — ABNORMAL HIGH (ref 4.0–10.5)
nRBC: 0 % (ref 0.0–0.2)

## 2019-11-14 LAB — SAMPLE TO BLOOD BANK

## 2019-11-14 NOTE — Patient Instructions (Signed)
COVID-19: How to Protect Yourself and Others Know how it spreads  There is currently no vaccine to prevent coronavirus disease 2019 (COVID-19).  The best way to prevent illness is to avoid being exposed to this virus.  The virus is thought to spread mainly from person-to-person. ? Between people who are in close contact with one another (within about 6 feet). ? Through respiratory droplets produced when an infected person coughs, sneezes or talks. ? These droplets can land in the mouths or noses of people who are nearby or possibly be inhaled into the lungs. ? Some recent studies have suggested that COVID-19 may be spread by people who are not showing symptoms. Everyone should Clean your hands often  Wash your hands often with soap and water for at least 20 seconds especially after you have been in a public place, or after blowing your nose, coughing, or sneezing.  If soap and water are not readily available, use a hand sanitizer that contains at least 60% alcohol. Cover all surfaces of your hands and rub them together until they feel dry.  Avoid touching your eyes, nose, and mouth with unwashed hands. Avoid close contact  Stay home if you are sick.  Avoid close contact with people who are sick.  Put distance between yourself and other people. ? Remember that some people without symptoms may be able to spread virus. ? This is especially important for people who are at higher risk of getting very sick.www.cdc.gov/coronavirus/2019-ncov/need-extra-precautions/people-at-higher-risk.html Cover your mouth and nose with a cloth face cover when around others  You could spread COVID-19 to others even if you do not feel sick.  Everyone should wear a cloth face cover when they have to go out in public, for example to the grocery store or to pick up other necessities. ? Cloth face coverings should not be placed on young children under age 2, anyone who has trouble breathing, or is unconscious,  incapacitated or otherwise unable to remove the mask without assistance.  The cloth face cover is meant to protect other people in case you are infected.  Do NOT use a facemask meant for a healthcare worker.  Continue to keep about 6 feet between yourself and others. The cloth face cover is not a substitute for social distancing. Cover coughs and sneezes  If you are in a private setting and do not have on your cloth face covering, remember to always cover your mouth and nose with a tissue when you cough or sneeze or use the inside of your elbow.  Throw used tissues in the trash.  Immediately wash your hands with soap and water for at least 20 seconds. If soap and water are not readily available, clean your hands with a hand sanitizer that contains at least 60% alcohol. Clean and disinfect  Clean AND disinfect frequently touched surfaces daily. This includes tables, doorknobs, light switches, countertops, handles, desks, phones, keyboards, toilets, faucets, and sinks. www.cdc.gov/coronavirus/2019-ncov/prevent-getting-sick/disinfecting-your-home.html  If surfaces are dirty, clean them: Use detergent or soap and water prior to disinfection.  Then, use a household disinfectant. You can see a list of EPA-registered household disinfectants here. cdc.gov/coronavirus 03/20/2019 This information is not intended to replace advice given to you by your health care provider. Make sure you discuss any questions you have with your health care provider. Document Released: 02/27/2019 Document Revised: 03/28/2019 Document Reviewed: 02/27/2019 Elsevier Patient Education  2020 Elsevier Inc.  

## 2019-11-14 NOTE — Progress Notes (Signed)
Hgb 8.5 today, pt not within parameters to receive PRBCs.  Desk RN Eustaquio Maize made aware for MD Dorsey's office.  Pt asymptomatic for anemia, VSS.  Pt will not receive PRBCs today, verbalized understanding and denies any further questions at this time.  Ambulatory to exit with belongings.

## 2019-11-15 ENCOUNTER — Other Ambulatory Visit: Payer: Self-pay | Admitting: Student

## 2019-11-15 LAB — AFP TUMOR MARKER: AFP, Serum, Tumor Marker: 1 ng/mL (ref 0.0–8.3)

## 2019-11-16 DIAGNOSIS — K869 Disease of pancreas, unspecified: Secondary | ICD-10-CM | POA: Insufficient documentation

## 2019-11-16 DIAGNOSIS — D509 Iron deficiency anemia, unspecified: Secondary | ICD-10-CM | POA: Insufficient documentation

## 2019-11-16 DIAGNOSIS — C801 Malignant (primary) neoplasm, unspecified: Secondary | ICD-10-CM | POA: Insufficient documentation

## 2019-11-16 DIAGNOSIS — Z23 Encounter for immunization: Secondary | ICD-10-CM | POA: Insufficient documentation

## 2019-11-16 DIAGNOSIS — R160 Hepatomegaly, not elsewhere classified: Secondary | ICD-10-CM | POA: Insufficient documentation

## 2019-11-16 DIAGNOSIS — G893 Neoplasm related pain (acute) (chronic): Secondary | ICD-10-CM | POA: Insufficient documentation

## 2019-11-16 DIAGNOSIS — C7951 Secondary malignant neoplasm of bone: Secondary | ICD-10-CM | POA: Insufficient documentation

## 2019-11-16 DIAGNOSIS — I10 Essential (primary) hypertension: Secondary | ICD-10-CM | POA: Insufficient documentation

## 2019-11-19 ENCOUNTER — Other Ambulatory Visit: Payer: Self-pay | Admitting: Hematology and Oncology

## 2019-11-19 ENCOUNTER — Inpatient Hospital Stay: Payer: Managed Care, Other (non HMO) | Admitting: Hematology and Oncology

## 2019-11-19 ENCOUNTER — Other Ambulatory Visit: Payer: Self-pay

## 2019-11-19 ENCOUNTER — Ambulatory Visit (HOSPITAL_COMMUNITY)
Admission: RE | Admit: 2019-11-19 | Discharge: 2019-11-19 | Disposition: A | Discharge status: Home / Self Care | Payer: Managed Care, Other (non HMO) | Source: Ambulatory Visit | Attending: Hematology and Oncology | Admitting: Hematology and Oncology

## 2019-11-19 ENCOUNTER — Inpatient Hospital Stay: Payer: Managed Care, Other (non HMO)

## 2019-11-19 ENCOUNTER — Encounter (HOSPITAL_COMMUNITY): Payer: Self-pay

## 2019-11-19 VITALS — BP 119/75 | HR 110 | Temp 98.5°F | Resp 16 | Ht 70.0 in | Wt 179.7 lb

## 2019-11-19 DIAGNOSIS — C159 Malignant neoplasm of esophagus, unspecified: Secondary | ICD-10-CM

## 2019-11-19 DIAGNOSIS — I10 Essential (primary) hypertension: Secondary | ICD-10-CM | POA: Diagnosis not present

## 2019-11-19 DIAGNOSIS — Z23 Encounter for immunization: Secondary | ICD-10-CM | POA: Diagnosis not present

## 2019-11-19 DIAGNOSIS — Z79899 Other long term (current) drug therapy: Secondary | ICD-10-CM | POA: Insufficient documentation

## 2019-11-19 DIAGNOSIS — C801 Malignant (primary) neoplasm, unspecified: Secondary | ICD-10-CM | POA: Insufficient documentation

## 2019-11-19 DIAGNOSIS — Z7982 Long term (current) use of aspirin: Secondary | ICD-10-CM | POA: Diagnosis not present

## 2019-11-19 DIAGNOSIS — Z510 Encounter for antineoplastic radiation therapy: Secondary | ICD-10-CM | POA: Insufficient documentation

## 2019-11-19 DIAGNOSIS — C7951 Secondary malignant neoplasm of bone: Secondary | ICD-10-CM | POA: Insufficient documentation

## 2019-11-19 DIAGNOSIS — C787 Secondary malignant neoplasm of liver and intrahepatic bile duct: Secondary | ICD-10-CM

## 2019-11-19 DIAGNOSIS — K8689 Other specified diseases of pancreas: Secondary | ICD-10-CM

## 2019-11-19 HISTORY — DX: Malignant (primary) neoplasm, unspecified: C80.1

## 2019-11-19 HISTORY — PX: IR IMAGING GUIDED PORT INSERTION: IMG5740

## 2019-11-19 LAB — CBC WITH DIFFERENTIAL (CANCER CENTER ONLY)
Abs Immature Granulocytes: 0.24 10*3/uL — ABNORMAL HIGH (ref 0.00–0.07)
Basophils Absolute: 0.1 10*3/uL (ref 0.0–0.1)
Basophils Relative: 0 %
Eosinophils Absolute: 0.1 10*3/uL (ref 0.0–0.5)
Eosinophils Relative: 0 %
HCT: 29.2 % — ABNORMAL LOW (ref 39.0–52.0)
Hemoglobin: 8.6 g/dL — ABNORMAL LOW (ref 13.0–17.0)
Immature Granulocytes: 1 %
Lymphocytes Relative: 3 %
Lymphs Abs: 0.7 10*3/uL (ref 0.7–4.0)
MCH: 20.6 pg — ABNORMAL LOW (ref 26.0–34.0)
MCHC: 29.5 g/dL — ABNORMAL LOW (ref 30.0–36.0)
MCV: 70 fL — ABNORMAL LOW (ref 80.0–100.0)
Monocytes Absolute: 1.3 10*3/uL — ABNORMAL HIGH (ref 0.1–1.0)
Monocytes Relative: 6 %
Neutro Abs: 20.4 10*3/uL — ABNORMAL HIGH (ref 1.7–7.7)
Neutrophils Relative %: 90 %
Platelet Count: 378 10*3/uL (ref 150–400)
RBC: 4.17 MIL/uL — ABNORMAL LOW (ref 4.22–5.81)
RDW: 23 % — ABNORMAL HIGH (ref 11.5–15.5)
WBC Count: 22.8 10*3/uL — ABNORMAL HIGH (ref 4.0–10.5)
nRBC: 0 % (ref 0.0–0.2)

## 2019-11-19 LAB — CMP (CANCER CENTER ONLY)
ALT: 32 U/L (ref 0–44)
AST: 48 U/L — ABNORMAL HIGH (ref 15–41)
Albumin: 2.2 g/dL — ABNORMAL LOW (ref 3.5–5.0)
Alkaline Phosphatase: 431 U/L — ABNORMAL HIGH (ref 38–126)
Anion gap: 11 (ref 5–15)
BUN: 13 mg/dL (ref 6–20)
CO2: 21 mmol/L — ABNORMAL LOW (ref 22–32)
Calcium: 10.2 mg/dL (ref 8.9–10.3)
Chloride: 103 mmol/L (ref 98–111)
Creatinine: 0.79 mg/dL (ref 0.61–1.24)
GFR, Est AFR Am: >60 mL/min (ref >60)
GFR, Estimated: >60 mL/min (ref >60)
Glucose, Bld: 132 mg/dL — ABNORMAL HIGH (ref 70–99)
Potassium: 4 mmol/L (ref 3.5–5.1)
Sodium: 135 mmol/L (ref 135–145)
Total Bilirubin: 1.5 mg/dL — ABNORMAL HIGH (ref 0.3–1.2)
Total Protein: 6.6 g/dL (ref 6.5–8.1)

## 2019-11-19 LAB — CBC
HCT: 29.8 % — ABNORMAL LOW (ref 39.0–52.0)
Hemoglobin: 8.7 g/dL — ABNORMAL LOW (ref 13.0–17.0)
MCH: 21.3 pg — ABNORMAL LOW (ref 26.0–34.0)
MCHC: 29.2 g/dL — ABNORMAL LOW (ref 30.0–36.0)
MCV: 72.9 fL — ABNORMAL LOW (ref 80.0–100.0)
Platelets: 416 10*3/uL — ABNORMAL HIGH (ref 150–400)
RBC: 4.09 MIL/uL — ABNORMAL LOW (ref 4.22–5.81)
RDW: 23.3 % — ABNORMAL HIGH (ref 11.5–15.5)
WBC: 26.9 10*3/uL — ABNORMAL HIGH (ref 4.0–10.5)
nRBC: 0 % (ref 0.0–0.2)

## 2019-11-19 LAB — SAMPLE TO BLOOD BANK

## 2019-11-19 LAB — PROTIME-INR
INR: 1.3 — ABNORMAL HIGH (ref 0.8–1.2)
Prothrombin Time: 16.4 seconds — ABNORMAL HIGH (ref 11.4–15.2)

## 2019-11-19 LAB — MAGNESIUM: Magnesium: 2 mg/dL (ref 1.7–2.4)

## 2019-11-19 MED ORDER — LIDOCAINE-EPINEPHRINE (PF) 2 %-1:200000 IJ SOLN
INTRAMUSCULAR | Status: AC
  Filled 2019-11-19: qty 20

## 2019-11-19 MED ORDER — CEFAZOLIN SODIUM-DEXTROSE 2-4 GM/100ML-% IV SOLN
INTRAVENOUS | Status: AC
  Administered 2019-11-19: 2 g via INTRAVENOUS
  Filled 2019-11-19: qty 100

## 2019-11-19 MED ORDER — INFLUENZA VAC SPLIT QUAD 0.5 ML IM SUSY
0.5000 mL | PREFILLED_SYRINGE | Freq: Once | INTRAMUSCULAR | Status: AC
  Administered 2019-11-19: 10:00:00 0.5 mL via INTRAMUSCULAR

## 2019-11-19 MED ORDER — MIDAZOLAM HCL 2 MG/2ML IJ SOLN
INTRAMUSCULAR | Status: AC | PRN
  Administered 2019-11-19 (×3): 1 mg via INTRAVENOUS

## 2019-11-19 MED ORDER — LIDOCAINE HCL 1 % IJ SOLN
INTRAMUSCULAR | Status: AC | PRN
  Administered 2019-11-19: 5 mL

## 2019-11-19 MED ORDER — HEPARIN SOD (PORK) LOCK FLUSH 100 UNIT/ML IV SOLN
INTRAVENOUS | Status: AC | PRN
  Administered 2019-11-19: 500 [IU] via INTRAVENOUS

## 2019-11-19 MED ORDER — FENTANYL CITRATE (PF) 100 MCG/2ML IJ SOLN
INTRAMUSCULAR | Status: AC | PRN
  Administered 2019-11-19 (×4): 50 ug via INTRAVENOUS

## 2019-11-19 MED ORDER — FENTANYL CITRATE (PF) 100 MCG/2ML IJ SOLN
INTRAMUSCULAR | Status: AC
  Filled 2019-11-19: qty 4

## 2019-11-19 MED ORDER — CEFAZOLIN SODIUM-DEXTROSE 2-4 GM/100ML-% IV SOLN: 2.0000 g | Freq: Once | INTRAVENOUS | Status: AC

## 2019-11-19 MED ORDER — SODIUM CHLORIDE 0.9 % IV SOLN: INTRAVENOUS | Status: DC

## 2019-11-19 MED ORDER — HEPARIN SOD (PORK) LOCK FLUSH 100 UNIT/ML IV SOLN
INTRAVENOUS | Status: AC
  Filled 2019-11-19: qty 5

## 2019-11-19 MED ORDER — MIDAZOLAM HCL 2 MG/2ML IJ SOLN
INTRAMUSCULAR | Status: AC
  Filled 2019-11-19: qty 6

## 2019-11-19 MED ORDER — INFLUENZA VAC SPLIT QUAD 0.5 ML IM SUSY
PREFILLED_SYRINGE | INTRAMUSCULAR | Status: AC
  Filled 2019-11-19: qty 0.5

## 2019-11-19 MED ORDER — LIDOCAINE-EPINEPHRINE (PF) 1 %-1:200000 IJ SOLN
INTRAMUSCULAR | Status: AC | PRN
  Administered 2019-11-19: 10 mL

## 2019-11-19 NOTE — Discharge Instructions (Signed)
No EMLA cream for two weeks      Implanted Port Insertion, Care After This sheet gives you information about how to care for yourself after your procedure. Your health care provider may also give you more specific instructions. If you have problems or questions, contact your health care provider. What can I expect after the procedure? After the procedure, it is common to have:  Discomfort at the port insertion site.  Bruising on the skin over the port. This should improve over 3-4 days. Follow these instructions at home: [facility] care  After your port is placed, you will get a manufacturer's information card. The card has information about your port. Keep this card with you at all times.  Take care of the port as told by your health care provider. Ask your health care provider if you or a family member can get training for taking care of the port at home. A home health care nurse may also take care of the port.  Make sure to remember what type of port you have. Incision care      Follow instructions from your health care provider about how to take care of your port insertion site. Make sure you: ? Wash your hands with soap and water before and after you change your bandage (dressing). If soap and water are not available, use hand sanitizer. ? You may remove your dressing on your right upper chest and shower tomorrow around 4 PM.  Leave skin glue in place. These skin closures may need to stay in place for 2 weeks or longer. If adhesive strip edges start to loosen and curl up, you may trim the loose edges.Check your port insertion site every day for signs of infection.   Check for: ? Redness, swelling, or pain. ? Fluid or blood. ? Warmth. ? Pus or a bad smell. Activity  Return to your normal activities as told by your health care provider. Ask your health care provider what activities are safe for you.  Do not lift anything that is heavier than 10 lb (4.5 kg), or the limit  that you are told, until your health care provider says that it is safe. General instructions  Take over-the-counter and prescription medicines only as told by your health care provider.  Do not take baths, swim, or use a hot tub until your health care provider approves. Ask your health care provider if you may take showers. You may only be allowed to take sponge baths.  Do not drive for 24 hours if you were given a sedative during your procedure.  Wear a medical alert bracelet in case of an emergency. This will tell any health care providers that you have a port.  Keep all follow-up visits as told by your health care provider. This is important. Contact a health care provider if:  You cannot flush your port with saline as directed, or you cannot draw blood from the port.  You have a fever or chills.  You have redness, swelling, or pain around your port insertion site.  You have fluid or blood coming from your port insertion site.  Your port insertion site feels warm to the touch.  You have pus or a bad smell coming from the port insertion site. Get help right away if:  You have chest pain or shortness of breath.  You have bleeding from your port that you cannot control. Summary  Take care of the port as told by your health care provider. Keep  the manufacturer's information card with you at all times.  Change your dressing as told by your health care provider.  Contact a health care provider if you have a fever or chills or if you have redness, swelling, or pain around your port insertion site.  Keep all follow-up visits as told by your health care provider. This information is not intended to replace advice given to you by your health care provider. Make sure you discuss any questions you have with your health care provider. Document Revised: 05/30/2018 Document Reviewed: 05/30/2018 Elsevier Patient Education  [place]. Moderate Conscious Sedation, Adult, Care  After These instructions provide you with information about caring for yourself after your procedure. Your health care provider may also give you more specific instructions. Your treatment has been planned according to current medical practices, but problems sometimes occur. Call your health care provider if you have any problems or questions after your procedure. What can I expect after the procedure? After your procedure, it is common:  To feel sleepy for several hours.  To feel clumsy and have poor balance for several hours.  To have poor judgment for several hours.  To vomit if you eat too soon. Follow these instructions at home: For at least 24 hours after the procedure:   Do not: ? Participate in activities where you could fall or become injured. ? Drive. ? Use heavy machinery. ? Drink alcohol. ? Take sleeping pills or medicines that cause drowsiness. ? Make important decisions or sign legal documents. ? Take care of children on your own.  Rest. Eating and drinking  Follow the diet recommended by your health care provider.  If you vomit: ? Drink water, juice, or soup when you can drink without vomiting. ? Make sure you have little or no nausea before eating solid foods. General instructions  Have a responsible adult stay with you until you are awake and alert.  Take over-the-counter and prescription medicines only as told by your health care provider.  If you smoke, do not smoke without supervision.  Keep all follow-up visits as told by your health care provider. This is important. Contact a health care provider if:  You keep feeling nauseous or you keep vomiting.  You feel light-headed.  You develop a rash.  You have a fever. Get help right away if:  You have trouble breathing. This information is not intended to replace advice given to you by your health care provider. Make sure you discuss any questions you have with your health care provider. Document  Revised: 10/14/2017 Document Reviewed: 02/21/2016 Elsevier Patient Education  2020 [employer].

## 2019-11-19 NOTE — Progress Notes (Signed)
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 Noted to have lab abnormalities. CT Thoracic Spine/Lumbar shows lesion to T1 and compression fracture of T12. CT C/A/P showed 4 x 5 cm ill-defined hypoechoic mass within the pancreatic tail and innumerable low-density lesions throughout the liver noted and compatible with metastatic disease. Noted to have microcytic anemia and hypercalcemia to 13.8. 2) 11/05/2019: underwent US guided biopsy of liver lesion. Pathology confirmed metastatic squamous cell carcinoma. Requested EGD to confirm esophageal primary.   Interval History:  Kenneth Khan. 54 y.o. 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  He was last seen while inpatient in late Dec 2020.   In the interim since his last visit Kenneth Khan notes that he has been well.  He notes other than the pain in his back he has not had any new medical issues.  He reports that he worked at the office for approximately 5 hours and felt fine, however when he got home and laid on the couch he noted severe pain in his back.  He reports that he has been sleeping on his couch because it is lowered to the ground in his bed at home is higher and more difficult to climb into.  He reports that he is using his hydrocodon and muscle relaxers 3 times daily.  He notes that his pain is typically 6-7 out of 10 in severity.  He reports that he did have an episode of heartburn on Saturday night but it resolved rapidly with administration of Tums.  He is currently scheduled for an EGD to take place later this week.  According to our records  he has lost about 2.5 pounds since his discharge at the end of December.  A full 10 point ROS is listed below.  MEDICAL HISTORY:  Past Medical History:  Diagnosis Date   Cancer ([place])    Hypertension     SURGICAL HISTORY: Past Surgical History:  Procedure Laterality Date   IR IMAGING GUIDED PORT INSERTION  11/19/2019   NO PAST SURGERIES      SOCIAL HISTORY: Social History   Socioeconomic History   Marital status: Single    Spouse name: Not on file   Number of children: Not on file   Years of education: Not on file   Highest education level: Not on file  Occupational History   Not on file  Tobacco Use   Smoking status: Never Smoker   Smokeless tobacco: Never Used  Substance and Sexual Activity   Alcohol use: Yes   Drug use: Never   Sexual activity: Not on file  Other Topics Concern   Not on file  Social History Narrative   Not on file   Social Determinants of Health   Financial Resource Strain:    Difficulty of Paying Living Expenses: Not on file  Food Insecurity:    Worried About Running Out of Food in the Last Year: Not on file   Ran Out of Food in the Last Year: Not on file  Transportation Needs:    Lack of Transportation (Medical): Not on file   Lack of Transportation (Non-Medical): Not on file  Physical Activity:  Days of Exercise per Week: Not on file   Minutes of Exercise per Session: Not on file  Stress:    Feeling of Stress : Not on file  Social Connections:    Frequency of Communication with Friends and Family: Not on file   Frequency of Social Gatherings with Friends and Family: Not on file   Attends Religious Services: Not on file   Active Member of Clubs or Organizations: Not on file   Attends Archivist Meetings: Not on file   Marital Status: Not on file  Intimate Partner Violence:    Fear of Current or Ex-Partner: Not on file   Emotionally Abused: Not on file   Physically Abused: Not on file    Sexually Abused: Not on file    ALLERGIES:  has No Known Allergies.  MEDICATIONS:  Current Outpatient Medications  Medication Sig Dispense Refill   aspirin EC 81 MG tablet Take 81 mg by mouth daily.     fenofibrate 54 MG tablet Take 54 mg by mouth daily.     ferrous sulfate 325 (65 FE) MG tablet Take 1 tablet (325 mg total) by mouth daily with breakfast. Take with orange juice. 30 tablet 3   HYDROcodone-acetaminophen (NORCO/VICODIN) 5-325 MG tablet Take 1-2 tablets by mouth every 6 (six) hours as needed for up to 14 days for moderate pain. 112 tablet 0   methocarbamol (ROBAXIN) 750 MG tablet Take 750 mg by mouth 3 (three) times daily.     pravastatin (PRAVACHOL) 40 MG tablet Take 40 mg by mouth daily.     No current facility-administered medications for this visit.    REVIEW OF SYSTEMS:   Constitutional: ( - ) fevers, ( - )  chills , ( - ) night sweats Eyes: ( - ) blurriness of vision, ( - ) double vision, ( - ) watery eyes Ears, nose, mouth, throat, and face: ( - ) mucositis, ( - ) sore throat Respiratory: ( - ) cough, ( - ) dyspnea, ( - ) wheezes Cardiovascular: ( - ) palpitation, ( - ) chest discomfort, ( - ) lower extremity swelling Gastrointestinal:  ( - ) nausea, ( + ) heartburn, ( - ) change in bowel habits Skin: ( - ) abnormal skin rashes Lymphatics: ( - ) new lymphadenopathy, ( - ) easy bruising Neurological: ( - ) numbness, ( - ) tingling, ( - ) new weaknesses Behavioral/Psych: ( - ) mood change, ( - ) new changes  All other systems were reviewed with the patient and are negative.  PHYSICAL EXAMINATION: ECOG PERFORMANCE STATUS: 1 - Symptomatic but completely ambulatory  Vitals:   11/19/19 0839  BP: 119/75  Pulse: (!) 110  Resp: 16  Temp: 98.5 F (36.9 C)  SpO2: 100%   Filed Weights   11/19/19 0839  Weight: 179 lb 11.2 oz (81.5 kg)    GENERAL:well appearing middle aged Caucasian male alert, no distress and comfortable SKIN: skin color, texture, turgor  are normal, no rashes or significant lesions EYES: conjunctiva are pink and non-injected, sclera clear LUNGS: clear to auscultation and percussion with normal breathing effort HEART: regular rate & rhythm and no murmurs and no lower extremity edema ABDOMEN: soft, non-tender, non-distended, normal bowel sounds Musculoskeletal: no cyanosis of digits and no clubbing  PSYCH: alert & oriented x 3, fluent speech NEURO: no focal motor/sensory deficits  LABORATORY DATA:  I have reviewed the data as listed Recent Results (from the past 2160 hour(s))  Comprehensive metabolic panel  Status: Abnormal   Collection Time: 11/03/19  7:05 AM  Result Value Ref Range   Sodium 138 135 - 145 mmol/L   Potassium 4.2 3.5 - 5.1 mmol/L   Chloride 100 98 - 111 mmol/L   CO2 27 22 - 32 mmol/L   Glucose, Bld 153 (H) 70 - 99 mg/dL   BUN 46 (H) 6 - 20 mg/dL   Creatinine, Ser 1.59 (H) 0.61 - 1.24 mg/dL   Calcium 13.8 (HH) 8.9 - 10.3 mg/dL    Comment: CRITICAL RESULT CALLED TO, READ BACK BY AND VERIFIED WITH: S.BERTRANS RN @ [phone] 11/03/2019 BY C.EDENS    Total Protein 7.1 6.5 - 8.1 g/dL   Albumin 2.9 (L) 3.5 - 5.0 g/dL   AST 35 15 - 41 U/L   ALT 32 0 - 44 U/L   Alkaline Phosphatase 179 (H) 38 - 126 U/L   Total Bilirubin 0.6 0.3 - 1.2 mg/dL   GFR calc non Af Amer 48 (L) >60 mL/min   GFR calc Af Amer 56 (L) >60 mL/min   Anion gap 11 5 - 15    Comment: Performed at [facility], [place] [address] [place], [place] [address]  CBC with Differential     Status: Abnormal   Collection Time: 11/03/19  7:05 AM  Result Value Ref Range   WBC 34.0 (H) 4.0 - 10.5 K/uL   RBC 5.11 4.22 - 5.81 MIL/uL   Hemoglobin 10.4 (L) 13.0 - 17.0 g/dL   HCT 35.8 (L) 39.0 - 52.0 %   MCV 70.1 (L) 80.0 - 100.0 fL   MCH 20.4 (L) 26.0 - 34.0 pg   MCHC 29.1 (L) 30.0 - 36.0 g/dL   RDW 21.1 (H) 11.5 - 15.5 %   Platelets 566 (H) 150 - 400 K/uL   nRBC 0.0 0.0 - 0.2 %   Neutrophils Relative % 88 %   Neutro Abs 30.0 (H) 1.7 - 7.7 K/uL     Lymphocytes Relative 5 %   Lymphs Abs 1.6 0.7 - 4.0 K/uL   Monocytes Relative 6 %   Monocytes Absolute 1.9 (H) 0.1 - 1.0 K/uL   Eosinophils Relative 0 %   Eosinophils Absolute 0.0 0.0 - 0.5 K/uL   Basophils Relative 0 %   Basophils Absolute 0.1 0.0 - 0.1 K/uL   Immature Granulocytes 1 %   Abs Immature Granulocytes 0.32 (H) 0.00 - 0.07 K/uL    Comment: Performed at [facility] [address] [place], [place] [address]  Parathyroid hormone, intact (no Ca)     Status: Abnormal   Collection Time: 11/03/19  7:05 AM  Result Value Ref Range   PTH 10 (L) 15 - 65 pg/mL    Comment: (NOTE) Performed At: [facility] [place], [place] [id number] Rush Farmer MD [id number]   Pathologist smear review     Status: None   Collection Time: 11/03/19  7:05 AM  Result Value Ref Range   Path Review Leukocytosis with predominance of neutrophilia.     Comment: Normocytic anemia.  Thrombocytosis. Reviewed by Marlynn Perking. Melina Copa, M.D. 11/05/2019. Performed at [facility], [place] [address] [place], [place] [address]   Urinalysis, Routine w reflex microscopic     Status: Abnormal   Collection Time: 11/03/19  8:19 AM  Result Value Ref Range   Color, Urine STRAW (A) YELLOW   APPearance CLEAR CLEAR   Specific Gravity, Urine 1.011 1.005 - 1.030   pH 7.0 5.0 -  8.0   Glucose, UA NEGATIVE NEGATIVE mg/dL   Hgb urine dipstick NEGATIVE NEGATIVE   Bilirubin Urine NEGATIVE NEGATIVE   Ketones, ur NEGATIVE NEGATIVE mg/dL   Protein, ur NEGATIVE NEGATIVE mg/dL   Nitrite NEGATIVE NEGATIVE   Leukocytes,Ua NEGATIVE NEGATIVE    Comment: Performed at [facility] [address] [place], [place] [address]  T4, free     Status: None   Collection Time: 11/03/19  8:26 AM  Result Value Ref Range   Free T4 1.05 0.61 - 1.12 ng/dL    Comment: (NOTE) Biotin ingestion may interfere with free T4 tests. If the results are inconsistent with the TSH level, previous test results, or  the clinical presentation, then consider biotin interference. If needed, order repeat testing after stopping biotin. Performed at [facility], [place] [address] [place], [place] [address]   T4     Status: None   Collection Time: 11/03/19  8:26 AM  Result Value Ref Range   T4, Total 8.3 4.5 - 12.0 ug/dL    Comment: (NOTE) Performed At: [facility] [place], [place] [id number] Rush Farmer MD [id number]   TSH     Status: None   Collection Time: 11/03/19  8:26 AM  Result Value Ref Range   TSH 2.281 0.350 - 4.500 uIU/mL    Comment: Performed by a 3rd Generation assay with a functional sensitivity of <=0.01 uIU/mL. Performed at [facility], [place] [address], [place], [place] [address]   CEA     Status: Abnormal   Collection Time: 11/03/19 11:24 AM  Result Value Ref Range   CEA 22.3 (H) 0.0 - 4.7 ng/mL    Comment: (NOTE)                             Nonsmokers          <3.9                             Smokers             <5.6 Roche [employer]ostics Electrochemiluminescence Immunoassay (ECLIA) Values obtained with different assay methods or kits cannot be used interchangeably.  Results cannot be interpreted as absolute evidence of the presence or absence of malignant disease. Performed At: [facility] [place], [place] [id number] Rush Farmer MD [id number]   Cancer antigen 19-9     Status: Abnormal   Collection Time: 11/03/19 11:24 AM  Result Value Ref Range   CA 19-9 1,607 (H) 0 - 35 U/mL    Comment: (NOTE) Results confirmed on dilution. Roche [employer]ostics Electrochemiluminescence Immunoassay (ECLIA) Values obtained with different assay methods or kits cannot be used interchangeably.  Results cannot be interpreted as absolute evidence of the presence or absence of malignant disease. Performed At: [facility] [place], [place] [id number] Rush Farmer MD [id number]   Calcium, ionized      Status: Abnormal   Collection Time: 11/03/19 11:24 AM  Result Value Ref Range   Calcium, Ionized, Serum 8.3 (H) 4.5 - 5.6 mg/dL    Comment: (NOTE) **Results verified by repeat testing** Performed At: [facility] [place], [place] [id number] Rush Farmer MD [id number]   SARS CORONAVIRUS 2 (TAT 6-24 HRS) Nasopharyngeal Nasopharyngeal Swab     Status: None   Collection Time: 11/03/19 11:24 AM   Specimen: Nasopharyngeal  Swab  Result Value Ref Range   SARS Coronavirus 2 NEGATIVE NEGATIVE    Comment: (NOTE) SARS-CoV-2 target nucleic acids are NOT DETECTED. The SARS-CoV-2 RNA is generally detectable in upper and lower respiratory specimens during the acute phase of infection. Negative results do not preclude SARS-CoV-2 infection, do not rule out co-infections with other pathogens, and should not be used as the sole basis for treatment or other patient management decisions. Negative results must be combined with clinical observations, patient history, and epidemiological information. The expected result is Negative. Fact Sheet for Patients: SugarRoll.be Fact Sheet for Healthcare Providers: https://www.woods-mathews.com/ This test is not yet approved or cleared by the [place] FDA and  has been authorized for detection and/or diagnosis of SARS-CoV-2 by FDA under an Emergency Use Authorization (EUA). This EUA will remain  in effect (meaning this test can be used) for the duration of the COVID-19 declaration under Section 56 4(b)(1) of the Act, 21 U.S.C. section 360bbb-3(b)(1), unless the authorization is terminated or revoked sooner. Performed at [facility], [place] [address], [place], [place] [address]   HIV Antibody (routine testing w rflx)     Status: None   Collection Time: 11/03/19  2:45 PM  Result Value Ref Range   HIV Screen 4th Generation wRfx NON REACTIVE NON REACTIVE    Comment: Performed at  [facility] [address] [place], [place] [address]  Comprehensive metabolic panel     Status: Abnormal   Collection Time: 11/03/19  8:06 PM  Result Value Ref Range   Sodium 136 135 - 145 mmol/L   Potassium 4.0 3.5 - 5.1 mmol/L   Chloride 105 98 - 111 mmol/L   CO2 23 22 - 32 mmol/L   Glucose, Bld 246 (H) 70 - 99 mg/dL   BUN 38 (H) 6 - 20 mg/dL   Creatinine, Ser 1.57 (H) 0.61 - 1.24 mg/dL   Calcium 12.4 (H) 8.9 - 10.3 mg/dL   Total Protein 5.3 (L) 6.5 - 8.1 g/dL   Albumin 2.3 (L) 3.5 - 5.0 g/dL   AST 28 15 - 41 U/L   ALT 26 0 - 44 U/L   Alkaline Phosphatase 137 (H) 38 - 126 U/L   Total Bilirubin 0.7 0.3 - 1.2 mg/dL   GFR calc non Af Amer 49 (L) >60 mL/min   GFR calc Af Amer 57 (L) >60 mL/min   Anion gap 8 5 - 15    Comment: Performed at [facility], [place] [address], [place], [place] [address]  Comprehensive metabolic panel     Status: Abnormal   Collection Time: 11/04/19  5:39 AM  Result Value Ref Range   Sodium 139 135 - 145 mmol/L   Potassium 4.1 3.5 - 5.1 mmol/L   Chloride 107 98 - 111 mmol/L   CO2 25 22 - 32 mmol/L   Glucose, Bld 115 (H) 70 - 99 mg/dL   BUN 32 (H) 6 - 20 mg/dL   Creatinine, Ser 1.52 (H) 0.61 - 1.24 mg/dL   Calcium 12.2 (H) 8.9 - 10.3 mg/dL   Total Protein 5.7 (L) 6.5 - 8.1 g/dL   Albumin 2.4 (L) 3.5 - 5.0 g/dL   AST 32 15 - 41 U/L   ALT 28 0 - 44 U/L   Alkaline Phosphatase 145 (H) 38 - 126 U/L   Total Bilirubin 0.8 0.3 - 1.2 mg/dL   GFR calc non Af Amer 51 (L) >60 mL/min   GFR calc Af Wyvonnia Lora  59 (L) >60 mL/min   Anion gap 7 5 - 15    Comment: Performed at [facility] [address] [place], [place] [address]  CBC     Status: Abnormal   Collection Time: 11/04/19  5:39 AM  Result Value Ref Range   WBC 18.3 (H) 4.0 - 10.5 K/uL   RBC 4.35 4.22 - 5.81 MIL/uL   Hemoglobin 8.9 (L) 13.0 - 17.0 g/dL    Comment: Reticulocyte Hemoglobin testing may be clinically indicated, consider ordering this additional test [id number]    HCT 30.2  (L) 39.0 - 52.0 %   MCV 69.4 (L) 80.0 - 100.0 fL   MCH 20.5 (L) 26.0 - 34.0 pg   MCHC 29.5 (L) 30.0 - 36.0 g/dL   RDW 21.2 (H) 11.5 - 15.5 %   Platelets 279 150 - 400 K/uL    Comment: REPEATED TO VERIFY   nRBC 0.0 0.0 - 0.2 %    Comment: Performed at [facility], [place] [address], [place], [place] [address]  Protime-INR     Status: Abnormal   Collection Time: 11/04/19  5:39 AM  Result Value Ref Range   Prothrombin Time 15.7 (H) 11.4 - 15.2 seconds   INR 1.3 (H) 0.8 - 1.2    Comment: (NOTE) INR goal varies based on device and disease states. Performed at [facility], [place] [address], [place], [place] [address]   APTT     Status: None   Collection Time: 11/04/19  5:39 AM  Result Value Ref Range   aPTT 32 24 - 36 seconds    Comment: Performed at [facility] [address] [place], [place] [address]  Lactate dehydrogenase     Status: None   Collection Time: 11/04/19  5:39 AM  Result Value Ref Range   LDH 110 98 - 192 U/L    Comment: Performed at [facility], [place] [address], [place], [place] [address]  PSA     Status: None   Collection Time: 11/04/19  5:39 AM  Result Value Ref Range   Prostatic Specific Antigen 0.38 0.00 - 4.00 ng/mL    Comment: (NOTE) While PSA levels of <=4.0 ng/ml are reported as reference range, some men with levels below 4.0 ng/ml can have prostate cancer and many men with PSA above 4.0 ng/ml do not have prostate cancer.  Other tests such as free PSA, age specific reference ranges, PSA velocity and PSA doubling time may be helpful especially in men less than 58 years old. Performed at [facility], [place] [address] [place], [place] [address]   Ferritin     Status: None   Collection Time: 11/04/19  5:39 AM  Result Value Ref Range   Ferritin 307 24 - 336 ng/mL    Comment: Performed at [facility] [address] [place], [place] [address]  Iron and TIBC     Status: Abnormal   Collection Time: 11/04/19  5:39 AM    Result Value Ref Range   Iron 7 (L) 45 - 182 ug/dL   TIBC 309 250 - 450 ug/dL   Saturation Ratios 2 (L) 17.9 - 39.5 %   UIBC 302 ug/dL    Comment: Performed at [facility], [place] [address], [place], [place] [address]  Culture, blood (routine x 2)     Status: None   Collection Time: 11/04/19  7:09 AM   Specimen: BLOOD RIGHT ARM  Result Value Ref Range   Specimen Description BLOOD RIGHT ARM    Special Requests      BOTTLES DRAWN AEROBIC AND ANAEROBIC Blood Culture results may not be optimal due to an excessive volume of blood received in culture bottles   Culture      NO GROWTH 5 DAYS Performed at [facility] [address], [place], [place] [address]    Report Status 11/09/2019 FINAL   Culture, blood (routine x 2)     Status: None   Collection Time: 11/04/19  7:18 AM   Specimen: BLOOD LEFT ARM  Result Value Ref Range   Specimen Description BLOOD LEFT ARM    Special Requests      BOTTLES DRAWN AEROBIC AND ANAEROBIC Blood Culture results may not be optimal due to an excessive volume of blood received in culture bottles   Culture      NO GROWTH 5 DAYS Performed at [facility], [place] [address] [place], [place] [address]    Report Status 11/09/2019 FINAL   Urinalysis, Routine w reflex microscopic     Status: Abnormal   Collection Time: 11/04/19  7:44 AM  Result Value Ref Range   Color, Urine STRAW (A) YELLOW   APPearance CLEAR CLEAR   Specific Gravity, Urine 1.009 1.005 - 1.030   pH 7.0 5.0 - 8.0   Glucose, UA NEGATIVE NEGATIVE mg/dL   Hgb urine dipstick NEGATIVE NEGATIVE   Bilirubin Urine NEGATIVE NEGATIVE   Ketones, ur NEGATIVE NEGATIVE mg/dL   Protein, ur NEGATIVE NEGATIVE mg/dL   Nitrite NEGATIVE NEGATIVE   Leukocytes,Ua NEGATIVE NEGATIVE    Comment: Performed at [facility] [address], [place], [place] [id number]  Basic metabolic panel     Status: Abnormal   Collection Time: 11/04/19 12:42 PM  Result Value Ref Range   Sodium 140 135 -  145 mmol/L   Potassium 4.0 3.5 - 5.1 mmol/L   Chloride 109 98 - 111 mmol/L   CO2 23 22 - 32 mmol/L   Glucose, Bld 149 (H) 70 - 99 mg/dL   BUN 28 (H) 6 - 20 mg/dL   Creatinine, Ser 1.47 (H) 0.61 - 1.24 mg/dL   Calcium 11.1 (H) 8.9 - 10.3 mg/dL   GFR calc non Af Amer 53 (L) >60 mL/min   GFR calc Af Amer >60 >60 mL/min   Anion gap 8 5 - 15    Comment: Performed at [facility] [address] [place], [place] [address]  Sedimentation rate     Status: Abnormal   Collection Time: 11/05/19 10:47 AM  Result Value Ref Range   Sed Rate 46 (H) 0 - 16 mm/hr    Comment: Performed at [facility] [address] [place], [place] [address]  C-reactive protein     Status: Abnormal   Collection Time: 11/05/19 10:47 AM  Result Value Ref Range   CRP 13.6 (H) <1.0 mg/dL    Comment: Performed at [facility] [address], [place], [place] [address]  Surgical pathology     Status: None   Collection Time: 11/05/19  4:03 PM  Result Value Ref Range   SURGICAL PATHOLOGY      SURGICAL PATHOLOGY CASE: [id number] PATIENT: Kenneth Khan Surgical Pathology Report     Clinical History: probable stage 4 pancreatic cancer (cm)     FINAL MICROSCOPIC DIAGNOSIS:  A. LIVER, NEEDLE CORE BIOPSY: -  Metastatic squamous cell carcinoma -  See comment  COMMENT:  Dr. Saralyn Pilar reviewed the case and agrees with the above diagnosis.  Ms. Benita Gutter, RN was notified of these results on November 06, 2019 and read back for confirmation.  GROSS DESCRIPTION:  Received in formalin are 4 cores of gray-white to dark red soft tissue which range from 0.5 x 0.1 cm to 1.4 x 0.1 cm, and are divided among 2 blocks for routine histology.  SW 11/05/2019    Final Diagnosis performed by Thressa Sheller, MD.   Electronically signed 11/06/2019 Technical and / or Professional components performed at [facility]. [facility], [place] [address] [place], [place] [address].  Immunohistochemistry  Technical component (if applicable) was performed at [facility]. [address], [place], [place], [place] [address].   IMMUNOHISTOCHEMISTRY DISCLAIMER (if applicable): Some of these immunohistochemical stains may have been developed and the performance characteristics determine by [facility]. Some may not have been cleared or approved by the U.S. Food and Drug Administration. The FDA has determined that such clearance or approval is not necessary. This test is used for clinical purposes. It should not be regarded as investigational or for research. This laboratory is certified under the [place] (CLIA-88) as qualified to perform high complexity clinical laboratory testing.  The controls stained appropriately.   CBC     Status: Abnormal   Collection Time: 11/06/19  5:57 AM  Result Value Ref Range   WBC 16.2 (H) 4.0 - 10.5 K/uL   RBC 3.87 (L) 4.22 - 5.81 MIL/uL   Hemoglobin 8.0 (L) 13.0 - 17.0 g/dL    Comment: Reticulocyte Hemoglobin testing may be clinically indicated, consider ordering this additional test [id number]    HCT 26.9 (L) 39.0 - 52.0 %   MCV 69.5 (L) 80.0 - 100.0 fL   MCH 20.7 (L) 26.0 - 34.0 pg   MCHC 29.7 (L) 30.0 - 36.0 g/dL   RDW 21.5 (H) 11.5 - 15.5 %   Platelets 185 150 - 400 K/uL    Comment: REPEATED TO VERIFY   nRBC 0.0 0.0 - 0.2 %    Comment: Performed at [facility], [place] [address], [place], [place] [address]  Comprehensive metabolic panel     Status: Abnormal   Collection Time: 11/06/19  5:57 AM  Result Value Ref Range   Sodium 139 135 - 145 mmol/L   Potassium 3.6 3.5 - 5.1 mmol/L   Chloride 105 98 - 111 mmol/L   CO2 23 22 - 32 mmol/L   Glucose, Bld 150 (H) 70 - 99 mg/dL   BUN 20 6 - 20 mg/dL   Creatinine, Ser 1.33 (H) 0.61 - 1.24 mg/dL   Calcium 10.0 8.9 - 10.3 mg/dL   Total Protein 5.2 (L) 6.5 - 8.1 g/dL   Albumin 2.2 (L) 3.5 - 5.0 g/dL   AST 51 (H) 15 - 41 U/L    ALT 38 0 - 44 U/L   Alkaline Phosphatase 193 (H) 38 - 126 U/L   Total Bilirubin 0.6 0.3 - 1.2 mg/dL   GFR calc non Af Amer >60 >60 mL/min   GFR calc Af Amer >60 >60 mL/min   Anion gap 11 5 - 15    Comment: Performed at [facility], [place] [address], [place], [place] [address]  Sample to Blood Bank TYPE & HOLD     Status: None   Collection Time: 11/14/19  9:16 AM  Result Value Ref Range  Blood Bank Specimen SAMPLE AVAILABLE FOR TESTING    Sample Expiration      11/17/2019,2359 Performed at [facility], [place] [address] [place], [place] [address]   AFP tumor marker     Status: None   Collection Time: 11/14/19  9:17 AM  Result Value Ref Range   AFP, Serum, Tumor Marker 1.0 0.0 - 8.3 ng/mL    Comment: (NOTE) Roche [employer]ostics Electrochemiluminescence Immunoassay (ECLIA) Values obtained with different assay methods or kits cannot be used interchangeably.  Results cannot be interpreted as absolute evidence of the presence or absence of malignant disease. This test is not interpretable in pregnant females. Performed At: [facility] [place], [place] [id number] Rush Farmer MD [id number]   CBC with Differential ([place] Only)     Status: Abnormal   Collection Time: 11/14/19  9:17 AM  Result Value Ref Range   WBC Count 23.3 (H) 4.0 - 10.5 K/uL   RBC 4.07 (L) 4.22 - 5.81 MIL/uL   Hemoglobin 8.5 (L) 13.0 - 17.0 g/dL    Comment: Reticulocyte Hemoglobin testing may be clinically indicated, consider ordering this additional test [id number]    HCT 28.4 (L) 39.0 - 52.0 %   MCV 69.8 (L) 80.0 - 100.0 fL   MCH 20.9 (L) 26.0 - 34.0 pg   MCHC 29.9 (L) 30.0 - 36.0 g/dL   RDW 22.6 (H) 11.5 - 15.5 %   Platelet Count 281 150 - 400 K/uL   nRBC 0.0 0.0 - 0.2 %   Neutrophils Relative % 91 %   Neutro Abs 21.0 (H) 1.7 - 7.7 K/uL   Lymphocytes Relative 3 %   Lymphs Abs 0.7 0.7 - 4.0 K/uL   Monocytes Relative 5 %   Monocytes Absolute 1.3  (H) 0.1 - 1.0 K/uL   Eosinophils Relative 0 %   Eosinophils Absolute 0.1 0.0 - 0.5 K/uL   Basophils Relative 0 %   Basophils Absolute 0.1 0.0 - 0.1 K/uL   Immature Granulocytes 1 %   Abs Immature Granulocytes 0.20 (H) 0.00 - 0.07 K/uL    Comment: Performed at [facility], [address] [place], [place] [address]  Sample to Blood Bank     Status: None   Collection Time: 11/19/19  9:59 AM  Result Value Ref Range   Blood Bank Specimen SAMPLE AVAILABLE FOR TESTING    Sample Expiration      11/22/2019,2359 Performed at [facility], [place] [address], [place],  [address]   CMP ([place] only)     Status: Abnormal   Collection Time: 11/19/19  9:59 AM  Result Value Ref Range   Sodium 135 135 - 145 mmol/L   Potassium 4.0 3.5 - 5.1 mmol/L   Chloride 103 98 - 111 mmol/L   CO2 21 (L) 22 - 32 mmol/L   Glucose, Bld 132 (H) 70 - 99 mg/dL   BUN 13 6 - 20 mg/dL   Creatinine 0.79 0.61 - 1.24 mg/dL   Calcium 10.2 8.9 - 10.3 mg/dL   Total Protein 6.6 6.5 - 8.1 g/dL   Albumin 2.2 (L) 3.5 - 5.0 g/dL   AST 48 (H) 15 - 41 U/L   ALT 32 0 - 44 U/L   Alkaline Phosphatase 431 (H) 38 - 126 U/L   Total Bilirubin 1.5 (H) 0.3 - 1.2 mg/dL   GFR, Est Non Af Am >60 >60 mL/min   GFR, Est AFR Am >60 >60 mL/min  Anion gap 11 5 - 15    Comment: Performed at [facility], [address], [place], [place] [address]  CBC with Differential ([place] Only)     Status: Abnormal   Collection Time: 11/19/19  9:59 AM  Result Value Ref Range   WBC Count 22.8 (H) 4.0 - 10.5 K/uL   RBC 4.17 (L) 4.22 - 5.81 MIL/uL   Hemoglobin 8.6 (L) 13.0 - 17.0 g/dL    Comment: Reticulocyte Hemoglobin testing may be clinically indicated, consider ordering this additional test [id number]    HCT 29.2 (L) 39.0 - 52.0 %   MCV 70.0 (L) 80.0 - 100.0 fL   MCH 20.6 (L) 26.0 - 34.0 pg   MCHC 29.5 (L) 30.0 - 36.0 g/dL   RDW 23.0 (H) 11.5 - 15.5 %    Platelet Count 378 150 - 400 K/uL   nRBC 0.0 0.0 - 0.2 %   Neutrophils Relative % 90 %   Neutro Abs 20.4 (H) 1.7 - 7.7 K/uL   Lymphocytes Relative 3 %   Lymphs Abs 0.7 0.7 - 4.0 K/uL   Monocytes Relative 6 %   Monocytes Absolute 1.3 (H) 0.1 - 1.0 K/uL   Eosinophils Relative 0 %   Eosinophils Absolute 0.1 0.0 - 0.5 K/uL   Basophils Relative 0 %   Basophils Absolute 0.1 0.0 - 0.1 K/uL   Immature Granulocytes 1 %   Abs Immature Granulocytes 0.24 (H) 0.00 - 0.07 K/uL    Comment: Performed at [facility], [address], [place], [place] [address]  Magnesium     Status: None   Collection Time: 11/19/19  9:59 AM  Result Value Ref Range   Magnesium 2.0 1.7 - 2.4 mg/dL    Comment: Performed at [facility], [place] [address], [place], [place] [address]  CBC upon arrival     Status: Abnormal   Collection Time: 11/19/19  1:25 PM  Result Value Ref Range   WBC 26.9 (H) 4.0 - 10.5 K/uL   RBC 4.09 (L) 4.22 - 5.81 MIL/uL   Hemoglobin 8.7 (L) 13.0 - 17.0 g/dL    Comment: Reticulocyte Hemoglobin testing may be clinically indicated, consider ordering this additional test [id number]    HCT 29.8 (L) 39.0 - 52.0 %   MCV 72.9 (L) 80.0 - 100.0 fL   MCH 21.3 (L) 26.0 - 34.0 pg   MCHC 29.2 (L) 30.0 - 36.0 g/dL   RDW 23.3 (H) 11.5 - 15.5 %   Platelets 416 (H) 150 - 400 K/uL   nRBC 0.0 0.0 - 0.2 %    Comment: Performed at [facility], [place] [address] [place], [place] [address]  Protime-INR upon arrival     Status: Abnormal   Collection Time: 11/19/19  1:25 PM  Result Value Ref Range   Prothrombin Time 16.4 (H) 11.4 - 15.2 seconds   INR 1.3 (H) 0.8 - 1.2    Comment: (NOTE) INR goal varies based on device and disease states. Performed at [facility], [place] [address] [place], [place] [address]      RADIOGRAPHIC STUDIES: I have personally reviewed the radiological images as listed and agreed with the findings  in the report: no new interval images. Prior imaging of Ab/Pelvis and spinal fractures viewed while in house.   DG Chest 2 View  Result Date: 11/03/2019 CLINICAL DATA:  Abnormal labs EXAM: CHEST - 2 VIEW COMPARISON:  None. FINDINGS: Normal heart  size and mediastinal contours. No acute infiltrate or edema. No effusion or pneumothorax. No acute osseous findings. Artifact from EKG leads IMPRESSION: Negative chest. Electronically Signed   By: Monte Fantasia M.D.   On: 11/03/2019 09:14   DG Lumbar Spine Complete  Result Date: 11/03/2019 CLINICAL DATA:  Abnormal labs.  Back pain EXAM: LUMBAR SPINE - COMPLETE 4+ VIEW COMPARISON:  None. FINDINGS: T12 superior endplate fracture with mild height loss, age-indeterminate. No clear erosion, although question whether the right pedicle at T12 is more lucent than the others. Spondylitic spurring greatest at L2-3 where there is mild disc narrowing. No evidence of fracture or bone lesion. IMPRESSION: Age-indeterminate T12 superior endplate fracture with mild height loss. Considering the patient's calcium levels, suggests CT or MRI to exclude an underlying lesion. Electronically Signed   By: Monte Fantasia M.D.   On: 11/03/2019 08:31   CT Chest W Contrast  Result Date: 11/03/2019 CLINICAL DATA:  54 year old male with probable pathologic spine fracture identified on recent radiographs. Evaluate for malignancy/metastatic disease. EXAM: CT CHEST, ABDOMEN, AND PELVIS WITH CONTRAST TECHNIQUE: Multidetector CT imaging of the chest, abdomen and pelvis was performed following the standard protocol during bolus administration of intravenous contrast. CONTRAST:  6mL OMNIPAQUE IOHEXOL 300 MG/ML  SOLN COMPARISON:  11/03/2019 lumbar spine radiographs FINDINGS: CT CHEST FINDINGS Cardiovascular: Heart size is normal. No thoracic aortic aneurysm or pericardial effusion. Mediastinum/Nodes: Shotty mediastinal and RIGHT juxta pericardial lymph nodes are noted. No mediastinal mass  identified. The esophagus trachea and visualized thyroid are unremarkable. Lungs/Pleura: Mild bibasilar and dependent atelectasis identified. No evidence of pulmonary nodule, mass, airspace disease, consolidation, pleural effusion or pneumothorax. Musculoskeletal: Lytic lesions involving the T1 vertebral body, RIGHT 8th rib, LEFT 5th and 6th ribs and manubrium are noted compatible with bony metastatic disease. No acute fracture identified. CT ABDOMEN PELVIS FINDINGS Hepatobiliary: Innumerable low-density lesions throughout the liver noted and compatible with metastatic disease. No definite biliary dilatation identified. Cholelithiasis noted without CT evidence of acute cholecystitis. Pancreas: A 4 x 5 cm ill-defined hypoechoic mass within the pancreatic tail is identified, compatible with malignancy. This mass has a large contact surface with the splenic artery. The remainder of the pancreas is unremarkable. No pancreatic ductal dilatation. Spleen: Mild splenomegaly identified. No definite focal hepatic splenic lesions are noted. Adrenals/Urinary Tract: The kidneys, adrenal glands and bladder are unremarkable. Stomach/Bowel: Stomach is within normal limits. Appendix appears normal. No evidence of bowel wall thickening, distention, or inflammatory changes. Vascular/Lymphatic: Mildly enlarged periportal and celiac lymph nodes are present. No vascular abnormalities are noted. Reproductive: Mild prostate enlargement is noted. Other: No ascites, focal collection or pneumoperitoneum. Musculoskeletal: A 50% compression fracture of T12 is identified with underlying lytic lesion. A probable lytic lesion within the posterior RIGHT iliac bone (image 106-series 3) is noted. IMPRESSION: 1. 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 Innumerable hepatic masses, mildly enlarged abdominal lymph nodes and lytic lesions involving T1, bilateral ribs, manubrium, RIGHT iliac bone and T12 noted compatible with  diffuse metastatic disease. 50% pathologic fracture of T12. 2. Cholelithiasis without CT evidence of acute cholecystitis. Electronically Signed   By: Margarette Canada M.D.   On: 11/03/2019 14:12   CT Thoracic Spine Wo Contrast  Result Date: 11/03/2019 CLINICAL DATA:  Concern for pathologic fracture. EXAM: CT THORACIC AND LUMBAR SPINE WITHOUT CONTRAST TECHNIQUE: Multidetector CT imaging of the thoracic and lumbar spine was performed without contrast. Multiplanar CT image reconstructions were also generated. COMPARISON:  None. FINDINGS: CT THORACIC SPINE FINDINGS  Alignment: Normal Vertebrae: Extensive infiltrative lesion within the T1 body without pathologic fracture. No visible extraosseous tumor extension. Left fifth posterior rib infiltrative lesion with probable minimal subpleural extension. T12 extensive infiltrative lesion within the body, right eccentric, with comminuted body fracture that appears recent. Central height loss measures up to 60%. No significant retropulsion. Suspect there is posterior ventral epidural tumor extension at this level. Paraspinal and other soft tissues: Partially covered liver shows multiple low-density masses. There is also a partially covered mass at the left pancreatic tail. Disc levels: No significant degenerative changes CT LUMBAR SPINE FINDINGS Segmentation: 5 lumbar type vertebrae Alignment: Normal Vertebrae: Equivocal for early infiltrative lesion within the right posterior L5 body. No fracture. Paraspinal and other soft tissues: As above Disc levels: No significant degenerative changes or visible impingement IMPRESSION: 1. Metastatic pattern with infiltrative lesions in the T1 body, T12 body, and left posterior fifth rib. Pathologic fracture at T12 with moderate central height loss and probable ventral epidural tumor extension. 2. Partially covered abdomen shows pancreatic tail mass and extensive hepatic metastatic disease. Electronically Signed   By: Monte Fantasia M.D.    On: 11/03/2019 10:08   CT Lumbar Spine Wo Contrast  Result Date: 11/03/2019 CLINICAL DATA:  Concern for pathologic fracture. EXAM: CT THORACIC AND LUMBAR SPINE WITHOUT CONTRAST TECHNIQUE: Multidetector CT imaging of the thoracic and lumbar spine was performed without contrast. Multiplanar CT image reconstructions were also generated. COMPARISON:  None. FINDINGS: CT THORACIC SPINE FINDINGS Alignment: Normal Vertebrae: Extensive infiltrative lesion within the T1 body without pathologic fracture. No visible extraosseous tumor extension. Left fifth posterior rib infiltrative lesion with probable minimal subpleural extension. T12 extensive infiltrative lesion within the body, right eccentric, with comminuted body fracture that appears recent. Central height loss measures up to 60%. No significant retropulsion. Suspect there is posterior ventral epidural tumor extension at this level. Paraspinal and other soft tissues: Partially covered liver shows multiple low-density masses. There is also a partially covered mass at the left pancreatic tail. Disc levels: No significant degenerative changes CT LUMBAR SPINE FINDINGS Segmentation: 5 lumbar type vertebrae Alignment: Normal Vertebrae: Equivocal for early infiltrative lesion within the right posterior L5 body. No fracture. Paraspinal and other soft tissues: As above Disc levels: No significant degenerative changes or visible impingement IMPRESSION: 1. Metastatic pattern with infiltrative lesions in the T1 body, T12 body, and left posterior fifth rib. Pathologic fracture at T12 with moderate central height loss and probable ventral epidural tumor extension. 2. Partially covered abdomen shows pancreatic tail mass and extensive hepatic metastatic disease. Electronically Signed   By: Monte Fantasia M.D.   On: 11/03/2019 10:08   MR THORACIC SPINE W WO CONTRAST  Result Date: 11/05/2019 CLINICAL DATA:  Metastatic disease with pathologic fracture EXAM: MRI THORACIC WITHOUT  AND WITH CONTRAST TECHNIQUE: Multiplanar and multiecho pulse sequences of the thoracic spine were obtained without and with intravenous contrast. CONTRAST:  41mL GADAVIST GADOBUTROL 1 MMOL/ML IV SOLN COMPARISON:  Correlation made with recent CT imaging FINDINGS: MRI THORACIC SPINE FINDINGS Motion degraded. Alignment:  No significant listhesis. Vertebrae: There is abnormal STIR hyperintensity and enhancement at T1, T12, and L1 vertebral bodies. This is superimposed on diffusely abnormal marrow signal with more patchy STIR hyperintensity and enhancement. Compression fracture of T12 is again identified with 50-60% loss of height. There is mild osseous retropulsion with ventral epidural extension of disease partially effacing the ventral subarachnoid space. Small left T10 lesion may reflect a hemangioma Cord: [place] frequent postnasal drip is calculi. There is  no abnormal cord signal. Paraspinal and other soft tissues: Multiple hepatic metastases. Paraspinal soft tissues are unremarkable. Abnormal marrow signal also involves the ribs, which are not as well evaluated due to motion artifact. Disc levels: Intervertebral disc heights and signal are maintained. There is no significant degenerative stenosis. IMPRESSION: Metastatic lesions at T1, T12, and L1. There is moderate compression deformity of T12 with mild ventral epidural extension but no cord compression. Additional diffusely abnormal marrow signal may reflect hematopoietic marrow given anemia or possibly more infiltrative metastatic disease. Electronically Signed   By: Macy Mis M.D.   On: 11/05/2019 20:56   NM Bone Scan Whole Body  Result Date: 11/04/2019 CLINICAL DATA:  Evaluate for bony metastatic disease. EXAM: NUCLEAR MEDICINE WHOLE BODY BONE SCAN TECHNIQUE: Whole body anterior and posterior images were obtained approximately 3 hours after intravenous injection of radiopharmaceutical. RADIOPHARMACEUTICALS:  21.5 mCi Technetium-75m MDP IV COMPARISON:   CT scan of the chest, abdomen, and pelvis November 03, 2019 FINDINGS: The patient's bony metastatic disease seen on yesterday's CT scan is almost entirely lytic which limits evaluation with a nuclear medicine bone scan. There is a focus of uptake in the left periorbital region, slightly asymmetric to the right. There is greater uptake in the right mastoid compared to the left. There is uptake in several anterior right ribs and and at least 1 posterior right rib. No definitive abnormal uptake in the left ribs, upper extremities, or sternum. The known lucent lesion in T1 on the CT scan is not appreciated on this study. The patient's known compression fracture of T12 is not appreciated on this study. The lytic lesion in the left femoral neck is not appreciated on this study. There does appear to be some mild increased uptake in the proximal left femoral diaphysis however. The apparent lytic lesion within the left iliac bone on the recent CT scan on image 106 demonstrates no correlate on this study. No other abnormalities in the lower extremities. Increased uptake in the soft tissues of the right upper quadrant are consistent with the patient's known liver metastases. Uptake within the kidneys is normal. Slight increased soft tissue uptake superior to the left kidney correlates with the known pancreatic mass. No other soft tissue abnormalities identified. IMPRESSION: 1. The apparent bony metastatic disease on the CT scan from November 03, 2019 is almost entirely lytic. Sensitivity of a nuclear medicine bone scan for lytic lesions is very limited. Nuclear medicine bone scans are much better in the evaluation of blastic metastases. Lytic metastases would be better assessed with an MRI or PET-CT. 2. There is a lytic lesion in the left femoral neck on the comparison CT scan which is not appreciated on this study. However, there is mild increased uptake in the proximal left femoral diaphysis concerning for a metastatic  lesion, not well assessed on the recent CT scan. The lytic lesions in the proximal left femur put the patient at risk for pathologic femoral fracture. 3. Soft tissue uptake in the right upper quadrant is consistent with known liver metastases. 4. Soft tissue uptake superior to left kidney likely correlates with the patient's pancreatic mass. 5. There is uptake over multiple anterior right ribs. The amount of uptake is out of proportion to the single lytic lesion seen on CT imaging. Bony metastatic disease in several anterior right ribs without CT correlate is possible. However, this uptake could be partially explained by summation of uptake in the right anterior ribs and the underlying liver metastases. 6. There is a  metastatic lesion in a posterior right rib not seen on CT imaging. 7. The apparent lytic metastases in left-sided ribs on the comparison CT scan is not appreciated on this study. 8. The apparent lytic metastasis in the left iliac bone on the recent CT scan is not appreciated on this study. 9. The suspected metastatic disease in T1 and T12 is not appreciated on this study. The known compression fracture at T12 is also not visualized on this study. 10. The mild uptake in the left periorbital region and right mastoid are nonspecific. These results will be called to the ordering clinician or representative by the Radiologist Assistant, and communication documented in the PACS or zVision Dashboard. Electronically Signed   By: Dorise Bullion III M.D   On: 11/04/2019 14:11   CT Abdomen Pelvis W Contrast  Result Date: 11/03/2019 CLINICAL DATA:  54 year old male with probable pathologic spine fracture identified on recent radiographs. Evaluate for malignancy/metastatic disease. EXAM: CT CHEST, ABDOMEN, AND PELVIS WITH CONTRAST TECHNIQUE: Multidetector CT imaging of the chest, abdomen and pelvis was performed following the standard protocol during bolus administration of intravenous contrast. CONTRAST:   55mL OMNIPAQUE IOHEXOL 300 MG/ML  SOLN COMPARISON:  11/03/2019 lumbar spine radiographs FINDINGS: CT CHEST FINDINGS Cardiovascular: Heart size is normal. No thoracic aortic aneurysm or pericardial effusion. Mediastinum/Nodes: Shotty mediastinal and RIGHT juxta pericardial lymph nodes are noted. No mediastinal mass identified. The esophagus trachea and visualized thyroid are unremarkable. Lungs/Pleura: Mild bibasilar and dependent atelectasis identified. No evidence of pulmonary nodule, mass, airspace disease, consolidation, pleural effusion or pneumothorax. Musculoskeletal: Lytic lesions involving the T1 vertebral body, RIGHT 8th rib, LEFT 5th and 6th ribs and manubrium are noted compatible with bony metastatic disease. No acute fracture identified. CT ABDOMEN PELVIS FINDINGS Hepatobiliary: Innumerable low-density lesions throughout the liver noted and compatible with metastatic disease. No definite biliary dilatation identified. Cholelithiasis noted without CT evidence of acute cholecystitis. Pancreas: A 4 x 5 cm ill-defined hypoechoic mass within the pancreatic tail is identified, compatible with malignancy. This mass has a large contact surface with the splenic artery. The remainder of the pancreas is unremarkable. No pancreatic ductal dilatation. Spleen: Mild splenomegaly identified. No definite focal hepatic splenic lesions are noted. Adrenals/Urinary Tract: The kidneys, adrenal glands and bladder are unremarkable. Stomach/Bowel: Stomach is within normal limits. Appendix appears normal. No evidence of bowel wall thickening, distention, or inflammatory changes. Vascular/Lymphatic: Mildly enlarged periportal and celiac lymph nodes are present. No vascular abnormalities are noted. Reproductive: Mild prostate enlargement is noted. Other: No ascites, focal collection or pneumoperitoneum. Musculoskeletal: A 50% compression fracture of T12 is identified with underlying lytic lesion. A probable lytic lesion within the  posterior RIGHT iliac bone (image 106-series 3) is noted. IMPRESSION: 1. 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 Innumerable hepatic masses, mildly enlarged abdominal lymph nodes and lytic lesions involving T1, bilateral ribs, manubrium, RIGHT iliac bone and T12 noted compatible with diffuse metastatic disease. 50% pathologic fracture of T12. 2. Cholelithiasis without CT evidence of acute cholecystitis. Electronically Signed   By: Margarette Canada M.D.   On: 11/03/2019 14:12   US BIOPSY (LIVER)  Result Date: 11/05/2019 INDICATION: 54 year old male with imaging findings highly concerning for metastatic pancreatic adenocarcinoma. He presents for ultrasound-guided core biopsy of liver lesion to confirm. EXAM: ULTRASOUND BIOPSY CORE LIVER MEDICATIONS: None. ANESTHESIA/SEDATION: Moderate (conscious) sedation was employed during this procedure. A total of Versed 2 mg and Fentanyl 100 mcg was administered intravenously. Moderate Sedation Time: 10 minutes. The patient's level of  consciousness and vital signs were monitored continuously by radiology nursing throughout the procedure under my direct supervision. FLUOROSCOPY TIME:  None. COMPLICATIONS: None immediate. PROCEDURE: Informed written consent was obtained from the patient after a thorough discussion of the procedural risks, benefits and alternatives. All questions were addressed. Maximal Sterile Barrier Technique was utilized including caps, mask, sterile gowns, sterile gloves, sterile drape, hand hygiene and skin antiseptic. A timeout was performed prior to the initiation of the procedure. Ultrasound was used to interrogate the liver. Numerous echogenic and centrally necrotic lesions are present throughout the hepatic parenchyma. A suitable lesion in hepatic segment 3 was identified. A suitable skin entry site was selected and marked. The overlying skin was sterilely prepped and draped in the standard fashion using chlorhexidine skin  prep. Local anesthesia was attained by infiltration with 1% lidocaine. A small dermatotomy was made. Under real-time ultrasound guidance, a 17 gauge introducer needle was advanced through the liver and positioned at the margin of the mass. Multiple 18 gauge core biopsies were then coaxially obtained using the bio Pince automated biopsy device. Biopsy specimens were placed in formalin and delivered to pathology for further analysis. As the introducer needle was removed, the biopsy tract was embolized with a Gel-Foam slurry. The patient tolerated the procedure well. IMPRESSION: Ultrasound-guided core biopsy of liver lesion. Electronically Signed   By: Jacqulynn Cadet M.D.   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 He presents for portacatheter placement. EXAM: IMPLANTED PORT A CATH PLACEMENT WITH ULTRASOUND AND FLUOROSCOPIC GUIDANCE MEDICATIONS: 2 g Ancef; The antibiotic was administered within an appropriate time interval prior to skin puncture. ANESTHESIA/SEDATION: Versed 3 mg IV; Fentanyl 200 mcg IV; Moderate Sedation Time:  26 minutes The patient was continuously monitored during the procedure by the interventional radiology nurse under my direct supervision. FLUOROSCOPY TIME:  0 minutes, 30 seconds (5 mGy) COMPLICATIONS: None immediate. PROCEDURE: The right neck and chest was prepped with chlorhexidine, and draped in the usual sterile fashion using maximum barrier technique (cap and mask, sterile gown, sterile gloves, large sterile sheet, hand hygiene and cutaneous antiseptic). Local anesthesia was attained by infiltration with 1% lidocaine with epinephrine. Ultrasound demonstrated patency of the right internal jugular vein, and this was documented with an image. Under real-time ultrasound guidance, this vein was accessed with a 21 gauge micropuncture needle and  image documentation was performed. A small dermatotomy was made at the access site with an 11 scalpel. A 0.018" wire was advanced into the SVC and the access needle exchanged for a 15F micropuncture vascular sheath. The 0.018" wire was then removed and a 0.035" wire advanced into the IVC. An appropriate location for the subcutaneous reservoir was selected below the clavicle and an incision was made through the skin and underlying soft tissues. The subcutaneous tissues were then dissected using a combination of blunt and sharp surgical technique and a pocket was formed. A single lumen power injectable portacatheter was then tunneled through the subcutaneous tissues from the pocket to the dermatotomy and the port reservoir placed within the subcutaneous pocket. The venous access site was then serially dilated and a peel away vascular sheath placed over the wire. The wire was removed and the port catheter advanced into position under fluoroscopic guidance. The catheter tip is positioned in the superior cavoatrial junction. This was documented with a spot image. The portacatheter was then tested and found to  flush and aspirate well. The port was flushed with saline followed by 100 units/mL heparinized saline. The pocket was then closed in two layers using first subdermal inverted interrupted absorbable sutures followed by a running subcuticular suture. The epidermis was then sealed with Dermabond. The dermatotomy at the venous access site was also closed with Dermabond. IMPRESSION: Successful placement of a right IJ approach Power Port with ultrasound and fluoroscopic guidance. The catheter is ready for use. Electronically Signed   By: Jacqulynn Cadet M.D.   On: 11/19/2019 16:07    ASSESSMENT & PLAN Kenneth Khan. 54 y.o. 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  Return of the biopsy is most consistent with a squamous  cell carcinoma.  This is different than what we had anticipated which was a pancreatic primary given the large pancreatic lesion.  Given these findings I do believe that the primary tumor is esophageal, however we are requesting an EGD performed by [facility] gastroenterology to confirm this diagnosis.  In the event that this is an esophageal primary we would recommend proceeding with FOLFOX chemotherapy.  The EGD is scheduled for later this week.  In the event that this is not the primary tumor we will need to consider evaluation for anal and lung cancer as a possible primary source.  Today the patient and I discussed the likelihood of FOLFOX chemotherapy and the nature of that treatment.  I discussed that this is a lifelong condition and that the goal of therapy is to slow down the growth of the tumor, but it is not capable of completely removing it from his body.  As such I noted that this is a palliative chemotherapy with the intention of reducing symptoms and extending life, but not a curative therapy.  I discussed an overview of the symptoms to be expected including nausea, vomiting, neuropathy, cold intolerance, weight loss, and poor appetite.  The patient voices understanding of the symptoms.  Current plan is for FOLFOX chemotherapy to begin on 12/03/2019.  This regimen would consist of oxaliplatin 85mg /m2 IV on day 1, leucovorin 400mg /m2 IV on day 1, fluorouracil 400mg /m2 IV push day 1, fluorouracil 2400 mg/m2 over 48 hours as a continuous infusion. This is to be repeated q14 days until progression or intolerance.   #Metastatic Squamous Cell Cancer, Concerning for Esophageal Primary --proceed with port placement today and EGD later this week with [facility] to confirm primary site of tumor --will plan to start FOLFOX chemotherapy as first line palliative chemotherapy on 12/03/2019 --continue PO iron supplementation for iron deficiency anemia as below. Can consider IV iron if anemia worsens.  --if EGD does  not confirm primary site of tumor may need to consider anal or lung as primary site of disease. --RTC on 12/03/2019 prior to start of chemotherapy. In the interm plan for chemotherapy education and port flushes.   #Iron Deficiency Anemia --likely 2/2 to blood loss from active malignancy in the GI tract, presumed esophageal cancer --continue PO iron sulfate 325mg  daily as prescribed. --can consider IV iron if levels fail to improve on oral therapy  #Back Pain 2/2 to Metastatic Disease --continue hydrocodone 5-325mg  1-2 tablets q 6H PRN --continue robaxin 750mg  TID PRN --can consider kyphoplasty to help with spine pain  #Symptom Control --no current nausea, vomiting, diarrhea --continue to monitor. Will provide with anti-nausea and anti-diarrheal medications   Orders Placed This Encounter  Procedures   CBC with Differential (  [facility] Only)    Standing Status:   Future    Standing Expiration Date:   11/18/2020   CMP ([facility] only)    Standing Status:   Future    Standing Expiration Date:   11/18/2020   Magnesium    Standing Status:   Future    Number of Occurrences:   1    Standing Expiration Date:   11/18/2020   Sample to Blood Bank    Standing Status:   Future    Standing Expiration Date:   11/18/2020    All questions were answered. The patient knows to call the clinic with any problems, questions or concerns.  A total of more than 30 minutes were spent on this encounter and over half of that time was spent on counseling and coordination of care as outlined above.   [name], MD Department of Hematology/Oncology [place] at [facility] Phone: [phone] Pager: [phone] Email: [email or web address]  11/20/2019 6:20 PM

## 2019-11-19 NOTE — H&P (Signed)
Chief Complaint: Patient was seen in consultation today for port placement.  Referring Physician(s): Dorsey,John T IV  Supervising Physician: Jacqulynn Cadet  Patient Status: [facility] - Out-pt  History of Present Illness: Kenneth Lamm. is a 54 y.o. 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 Lorenso Courier who presents today for port placement. Kenneth Khan presented to [facility] ED on 11/03/19 with complaints of lower back pain after being seen earlier that week by his PCP for the same complaints. During his PCP visit he also had annual lab work drawn which were reported to be abnormal and he was told to go to the ED. Initial workup in the ED notable for creatinine 1.59, calcium 13.8, ALP 179, WBC 34.0, hgb 10.4, plt 566, CEA 22.3, CA 19-9 1607. Lumbar spine x-ray showed T12 superior endplate fracture concerning for pathologic fracture due to lab abnormalities. Follow up CT thoracic/lumbar spine showed metastatic pattern with infiltrative lesions in the T1 body, T12 body and left posterior 5th rib, partially covered abdomen showed pancreatic tail mass and extensive hepatic metastatic disease. He was admitted for further evaluation. He underwent a CT chest/abd/pelvis which was notable for a 5 cm pancreatic tail mass highly suspicious for pancreatic malignancy, innumerable hepatic masses, mildly enlarged abdominal lymph nodes and lytic lesions involving T1, bilateral ribs, manubrium, right iliac bone and T12. Oncology was consulted and patient underwent US guided liver biopsy in IR on 12/21, pathology of which showed metastatic squamous cell carcinoma. He was discharged on 12/22 with plans for close follow up with oncology. After thorough discussion with oncology team patient has decided to pursue systemic therapy and IR has been asked to place a port for long term venous access.  Kenneth Khan denies  any complaints today besides lower back pain which is unchanged from his recent hospital admission. He also is very hungry and looking forward to eating after the procedure. He states he has been eating and drinking at home without issue. He may begin chemotherapy next week. He states understanding of the requested procedure and wishes to proceed as planned.   Past Medical History:  Diagnosis Date   Hypertension     No past surgical history on file.  Allergies: Patient has no known allergies.  Medications: Prior to Admission medications   Medication Sig Start Date End Date Taking? Authorizing Provider  aspirin EC 81 MG tablet Take 81 mg by mouth daily.    [provider]  fenofibrate 54 MG tablet Take 54 mg by mouth daily.    [provider]  ferrous sulfate 325 (65 FE) MG tablet Take 1 tablet (325 mg total) by mouth daily with breakfast. Take with orange juice. 11/06/19   Al Decant, MD  HYDROcodone-acetaminophen (NORCO/VICODIN) 5-325 MG tablet Take 1-2 tablets by mouth every 6 (six) hours as needed for up to 14 days for moderate pain. 11/06/19 11/20/19  Al Decant, MD  methocarbamol (ROBAXIN) 750 MG tablet Take 750 mg by mouth 3 (three) times daily.    [provider]  pravastatin (PRAVACHOL) 40 MG tablet Take 40 mg by mouth daily.    [provider]     No family history on file.  Social History   Socioeconomic History   Marital status: Single    Spouse name: Not on file   Number of children: Not on file   Years of education: Not on file   Highest education  level: Not on file  Occupational History   Not on file  Tobacco Use   Smoking status: Never Smoker   Smokeless tobacco: Never Used  Substance and Sexual Activity   Alcohol use: Yes   Drug use: Never   Sexual activity: Not on file  Other Topics Concern   Not on file  Social History Narrative   Not on file   Social Determinants of Health   Financial Resource  Strain:    Difficulty of Paying Living Expenses: Not on file  Food Insecurity:    Worried About [place] in the Last Year: Not on file   Ran Out of Food in the Last Year: Not on file  Transportation Needs:    Lack of Transportation (Medical): Not on file   Lack of Transportation (Non-Medical): Not on file  Physical Activity:    Days of Exercise per Week: Not on file   Minutes of Exercise per Session: Not on file  Stress:    Feeling of Stress : Not on file  Social Connections:    Frequency of Communication with Friends and Family: Not on file   Frequency of Social Gatherings with Friends and Family: Not on file   Attends Religious Services: Not on file   Active Member of Clubs or Organizations: Not on file   Attends Archivist Meetings: Not on file   Marital Status: Not on file     Review of Systems: A 12 point ROS discussed and pertinent positives are indicated in the HPI above.  All other systems are negative.  Review of Systems  Constitutional: Negative for appetite change, chills and fever.  HENT: Negative for nosebleeds.   Respiratory: Negative for cough and shortness of breath.   Cardiovascular: Negative for chest pain.  Gastrointestinal: Negative for abdominal pain, blood in stool, diarrhea, nausea and vomiting.  Genitourinary: Negative for hematuria.  Musculoskeletal: Positive for back pain.  Skin: Negative for rash and wound.  Neurological: Negative for dizziness and headaches.    Vital Signs: BP 119/81    Pulse (!) 104    Temp 98.4 F (36.9 C) (Oral)    Resp 16    SpO2 99%   Physical Exam Vitals reviewed.  Constitutional:      General: He is not in acute distress. HENT:     Head: Normocephalic.     Mouth/Throat:     Mouth: Mucous membranes are moist.     Pharynx: Oropharynx is clear. No oropharyngeal exudate or posterior oropharyngeal erythema.  Cardiovascular:     Rate and Rhythm: Regular rhythm. Tachycardia present.    Pulmonary:     Effort: Pulmonary effort is normal.     Breath sounds: Normal breath sounds.  Abdominal:     General: Bowel sounds are normal. There is no distension.     Palpations: Abdomen is soft.     Tenderness: There is no abdominal tenderness.  Skin:    General: Skin is warm and dry.  Neurological:     Mental Status: He is alert and oriented to person, place, and time.  Psychiatric:        Mood and Affect: Mood normal.        Behavior: Behavior normal.        Thought Content: Thought content normal.        Judgment: Judgment normal.      MD Evaluation Airway: WNL Heart: WNL Abdomen: WNL Chest/ Lungs: WNL ASA  Classification: 3 Mallampati/Airway Score: One  Imaging: DG Chest 2 View  Result Date: 11/03/2019 CLINICAL DATA:  Abnormal labs EXAM: CHEST - 2 VIEW COMPARISON:  None. FINDINGS: Normal heart size and mediastinal contours. No acute infiltrate or edema. No effusion or pneumothorax. No acute osseous findings. Artifact from EKG leads IMPRESSION: Negative chest. Electronically Signed   By: Monte Fantasia M.D.   On: 11/03/2019 09:14   DG Lumbar Spine Complete  Result Date: 11/03/2019 CLINICAL DATA:  Abnormal labs.  Back pain EXAM: LUMBAR SPINE - COMPLETE 4+ VIEW COMPARISON:  None. FINDINGS: T12 superior endplate fracture with mild height loss, age-indeterminate. No clear erosion, although question whether the right pedicle at T12 is more lucent than the others. Spondylitic spurring greatest at L2-3 where there is mild disc narrowing. No evidence of fracture or bone lesion. IMPRESSION: Age-indeterminate T12 superior endplate fracture with mild height loss. Considering the patient's calcium levels, suggests CT or MRI to exclude an underlying lesion. Electronically Signed   By: Monte Fantasia M.D.   On: 11/03/2019 08:31   CT Chest W Contrast  Result Date: 11/03/2019 CLINICAL DATA:  54 year old male with probable pathologic spine fracture identified on recent  radiographs. Evaluate for malignancy/metastatic disease. EXAM: CT CHEST, ABDOMEN, AND PELVIS WITH CONTRAST TECHNIQUE: Multidetector CT imaging of the chest, abdomen and pelvis was performed following the standard protocol during bolus administration of intravenous contrast. CONTRAST:  10mL OMNIPAQUE IOHEXOL 300 MG/ML  SOLN COMPARISON:  11/03/2019 lumbar spine radiographs FINDINGS: CT CHEST FINDINGS Cardiovascular: Heart size is normal. No thoracic aortic aneurysm or pericardial effusion. Mediastinum/Nodes: Shotty mediastinal and RIGHT juxta pericardial lymph nodes are noted. No mediastinal mass identified. The esophagus trachea and visualized thyroid are unremarkable. Lungs/Pleura: Mild bibasilar and dependent atelectasis identified. No evidence of pulmonary nodule, mass, airspace disease, consolidation, pleural effusion or pneumothorax. Musculoskeletal: Lytic lesions involving the T1 vertebral body, RIGHT 8th rib, LEFT 5th and 6th ribs and manubrium are noted compatible with bony metastatic disease. No acute fracture identified. CT ABDOMEN PELVIS FINDINGS Hepatobiliary: Innumerable low-density lesions throughout the liver noted and compatible with metastatic disease. No definite biliary dilatation identified. Cholelithiasis noted without CT evidence of acute cholecystitis. Pancreas: A 4 x 5 cm ill-defined hypoechoic mass within the pancreatic tail is identified, compatible with malignancy. This mass has a large contact surface with the splenic artery. The remainder of the pancreas is unremarkable. No pancreatic ductal dilatation. Spleen: Mild splenomegaly identified. No definite focal hepatic splenic lesions are noted. Adrenals/Urinary Tract: The kidneys, adrenal glands and bladder are unremarkable. Stomach/Bowel: Stomach is within normal limits. Appendix appears normal. No evidence of bowel wall thickening, distention, or inflammatory changes. Vascular/Lymphatic: Mildly enlarged periportal and celiac lymph nodes  are present. No vascular abnormalities are noted. Reproductive: Mild prostate enlargement is noted. Other: No ascites, focal collection or pneumoperitoneum. Musculoskeletal: A 50% compression fracture of T12 is identified with underlying lytic lesion. A probable lytic lesion within the posterior RIGHT iliac bone (image 106-series 3) is noted. IMPRESSION: 1. 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 Innumerable hepatic masses, mildly enlarged abdominal lymph nodes and lytic lesions involving T1, bilateral ribs, manubrium, RIGHT iliac bone and T12 noted compatible with diffuse metastatic disease. 50% pathologic fracture of T12. 2. Cholelithiasis without CT evidence of acute cholecystitis. Electronically Signed   By: Margarette Canada M.D.   On: 11/03/2019 14:12   CT Thoracic Spine Wo Contrast  Result Date: 11/03/2019 CLINICAL DATA:  Concern for pathologic fracture. EXAM: CT THORACIC AND LUMBAR SPINE WITHOUT CONTRAST TECHNIQUE: Multidetector CT imaging  of the thoracic and lumbar spine was performed without contrast. Multiplanar CT image reconstructions were also generated. COMPARISON:  None. FINDINGS: CT THORACIC SPINE FINDINGS Alignment: Normal Vertebrae: Extensive infiltrative lesion within the T1 body without pathologic fracture. No visible extraosseous tumor extension. Left fifth posterior rib infiltrative lesion with probable minimal subpleural extension. T12 extensive infiltrative lesion within the body, right eccentric, with comminuted body fracture that appears recent. Central height loss measures up to 60%. No significant retropulsion. Suspect there is posterior ventral epidural tumor extension at this level. Paraspinal and other soft tissues: Partially covered liver shows multiple low-density masses. There is also a partially covered mass at the left pancreatic tail. Disc levels: No significant degenerative changes CT LUMBAR SPINE FINDINGS Segmentation: 5 lumbar type vertebrae  Alignment: Normal Vertebrae: Equivocal for early infiltrative lesion within the right posterior L5 body. No fracture. Paraspinal and other soft tissues: As above Disc levels: No significant degenerative changes or visible impingement IMPRESSION: 1. Metastatic pattern with infiltrative lesions in the T1 body, T12 body, and left posterior fifth rib. Pathologic fracture at T12 with moderate central height loss and probable ventral epidural tumor extension. 2. Partially covered abdomen shows pancreatic tail mass and extensive hepatic metastatic disease. Electronically Signed   By: Monte Fantasia M.D.   On: 11/03/2019 10:08   CT Lumbar Spine Wo Contrast  Result Date: 11/03/2019 CLINICAL DATA:  Concern for pathologic fracture. EXAM: CT THORACIC AND LUMBAR SPINE WITHOUT CONTRAST TECHNIQUE: Multidetector CT imaging of the thoracic and lumbar spine was performed without contrast. Multiplanar CT image reconstructions were also generated. COMPARISON:  None. FINDINGS: CT THORACIC SPINE FINDINGS Alignment: Normal Vertebrae: Extensive infiltrative lesion within the T1 body without pathologic fracture. No visible extraosseous tumor extension. Left fifth posterior rib infiltrative lesion with probable minimal subpleural extension. T12 extensive infiltrative lesion within the body, right eccentric, with comminuted body fracture that appears recent. Central height loss measures up to 60%. No significant retropulsion. Suspect there is posterior ventral epidural tumor extension at this level. Paraspinal and other soft tissues: Partially covered liver shows multiple low-density masses. There is also a partially covered mass at the left pancreatic tail. Disc levels: No significant degenerative changes CT LUMBAR SPINE FINDINGS Segmentation: 5 lumbar type vertebrae Alignment: Normal Vertebrae: Equivocal for early infiltrative lesion within the right posterior L5 body. No fracture. Paraspinal and other soft tissues: As above Disc  levels: No significant degenerative changes or visible impingement IMPRESSION: 1. Metastatic pattern with infiltrative lesions in the T1 body, T12 body, and left posterior fifth rib. Pathologic fracture at T12 with moderate central height loss and probable ventral epidural tumor extension. 2. Partially covered abdomen shows pancreatic tail mass and extensive hepatic metastatic disease. Electronically Signed   By: Monte Fantasia M.D.   On: 11/03/2019 10:08   MR THORACIC SPINE W WO CONTRAST  Result Date: 11/05/2019 CLINICAL DATA:  Metastatic disease with pathologic fracture EXAM: MRI THORACIC WITHOUT AND WITH CONTRAST TECHNIQUE: Multiplanar and multiecho pulse sequences of the thoracic spine were obtained without and with intravenous contrast. CONTRAST:  31mL GADAVIST GADOBUTROL 1 MMOL/ML IV SOLN COMPARISON:  Correlation made with recent CT imaging FINDINGS: MRI THORACIC SPINE FINDINGS Motion degraded. Alignment:  No significant listhesis. Vertebrae: There is abnormal STIR hyperintensity and enhancement at T1, T12, and L1 vertebral bodies. This is superimposed on diffusely abnormal marrow signal with more patchy STIR hyperintensity and enhancement. Compression fracture of T12 is again identified with 50-60% loss of height. There is mild osseous retropulsion with ventral epidural extension  of disease partially effacing the ventral subarachnoid space. Small left T10 lesion may reflect a hemangioma Cord: [place] frequent postnasal drip is calculi. There is no abnormal cord signal. Paraspinal and other soft tissues: Multiple hepatic metastases. Paraspinal soft tissues are unremarkable. Abnormal marrow signal also involves the ribs, which are not as well evaluated due to motion artifact. Disc levels: Intervertebral disc heights and signal are maintained. There is no significant degenerative stenosis. IMPRESSION: Metastatic lesions at T1, T12, and L1. There is moderate compression deformity of T12 with mild ventral  epidural extension but no cord compression. Additional diffusely abnormal marrow signal may reflect hematopoietic marrow given anemia or possibly more infiltrative metastatic disease. Electronically Signed   By: Macy Mis M.D.   On: 11/05/2019 20:56   NM Bone Scan Whole Body  Result Date: 11/04/2019 CLINICAL DATA:  Evaluate for bony metastatic disease. EXAM: NUCLEAR MEDICINE WHOLE BODY BONE SCAN TECHNIQUE: Whole body anterior and posterior images were obtained approximately 3 hours after intravenous injection of radiopharmaceutical. RADIOPHARMACEUTICALS:  21.5 mCi Technetium-44m MDP IV COMPARISON:  CT scan of the chest, abdomen, and pelvis November 03, 2019 FINDINGS: The patient's bony metastatic disease seen on yesterday's CT scan is almost entirely lytic which limits evaluation with a nuclear medicine bone scan. There is a focus of uptake in the left periorbital region, slightly asymmetric to the right. There is greater uptake in the right mastoid compared to the left. There is uptake in several anterior right ribs and and at least 1 posterior right rib. No definitive abnormal uptake in the left ribs, upper extremities, or sternum. The known lucent lesion in T1 on the CT scan is not appreciated on this study. The patient's known compression fracture of T12 is not appreciated on this study. The lytic lesion in the left femoral neck is not appreciated on this study. There does appear to be some mild increased uptake in the proximal left femoral diaphysis however. The apparent lytic lesion within the left iliac bone on the recent CT scan on image 106 demonstrates no correlate on this study. No other abnormalities in the lower extremities. Increased uptake in the soft tissues of the right upper quadrant are consistent with the patient's known liver metastases. Uptake within the kidneys is normal. Slight increased soft tissue uptake superior to the left kidney correlates with the known pancreatic mass. No  other soft tissue abnormalities identified. IMPRESSION: 1. The apparent bony metastatic disease on the CT scan from November 03, 2019 is almost entirely lytic. Sensitivity of a nuclear medicine bone scan for lytic lesions is very limited. Nuclear medicine bone scans are much better in the evaluation of blastic metastases. Lytic metastases would be better assessed with an MRI or PET-CT. 2. There is a lytic lesion in the left femoral neck on the comparison CT scan which is not appreciated on this study. However, there is mild increased uptake in the proximal left femoral diaphysis concerning for a metastatic lesion, not well assessed on the recent CT scan. The lytic lesions in the proximal left femur put the patient at risk for pathologic femoral fracture. 3. Soft tissue uptake in the right upper quadrant is consistent with known liver metastases. 4. Soft tissue uptake superior to left kidney likely correlates with the patient's pancreatic mass. 5. There is uptake over multiple anterior right ribs. The amount of uptake is out of proportion to the single lytic lesion seen on CT imaging. Bony metastatic disease in several anterior right ribs without CT correlate is possible.  However, this uptake could be partially explained by summation of uptake in the right anterior ribs and the underlying liver metastases. 6. There is a metastatic lesion in a posterior right rib not seen on CT imaging. 7. The apparent lytic metastases in left-sided ribs on the comparison CT scan is not appreciated on this study. 8. The apparent lytic metastasis in the left iliac bone on the recent CT scan is not appreciated on this study. 9. The suspected metastatic disease in T1 and T12 is not appreciated on this study. The known compression fracture at T12 is also not visualized on this study. 10. The mild uptake in the left periorbital region and right mastoid are nonspecific. These results will be called to the ordering clinician or  representative by the Radiologist Assistant, and communication documented in the PACS or zVision Dashboard. Electronically Signed   By: Dorise Bullion III M.D   On: 11/04/2019 14:11   CT Abdomen Pelvis W Contrast  Result Date: 11/03/2019 CLINICAL DATA:  54 year old male with probable pathologic spine fracture identified on recent radiographs. Evaluate for malignancy/metastatic disease. EXAM: CT CHEST, ABDOMEN, AND PELVIS WITH CONTRAST TECHNIQUE: Multidetector CT imaging of the chest, abdomen and pelvis was performed following the standard protocol during bolus administration of intravenous contrast. CONTRAST:  30mL OMNIPAQUE IOHEXOL 300 MG/ML  SOLN COMPARISON:  11/03/2019 lumbar spine radiographs FINDINGS: CT CHEST FINDINGS Cardiovascular: Heart size is normal. No thoracic aortic aneurysm or pericardial effusion. Mediastinum/Nodes: Shotty mediastinal and RIGHT juxta pericardial lymph nodes are noted. No mediastinal mass identified. The esophagus trachea and visualized thyroid are unremarkable. Lungs/Pleura: Mild bibasilar and dependent atelectasis identified. No evidence of pulmonary nodule, mass, airspace disease, consolidation, pleural effusion or pneumothorax. Musculoskeletal: Lytic lesions involving the T1 vertebral body, RIGHT 8th rib, LEFT 5th and 6th ribs and manubrium are noted compatible with bony metastatic disease. No acute fracture identified. CT ABDOMEN PELVIS FINDINGS Hepatobiliary: Innumerable low-density lesions throughout the liver noted and compatible with metastatic disease. No definite biliary dilatation identified. Cholelithiasis noted without CT evidence of acute cholecystitis. Pancreas: A 4 x 5 cm ill-defined hypoechoic mass within the pancreatic tail is identified, compatible with malignancy. This mass has a large contact surface with the splenic artery. The remainder of the pancreas is unremarkable. No pancreatic ductal dilatation. Spleen: Mild splenomegaly identified. No definite  focal hepatic splenic lesions are noted. Adrenals/Urinary Tract: The kidneys, adrenal glands and bladder are unremarkable. Stomach/Bowel: Stomach is within normal limits. Appendix appears normal. No evidence of bowel wall thickening, distention, or inflammatory changes. Vascular/Lymphatic: Mildly enlarged periportal and celiac lymph nodes are present. No vascular abnormalities are noted. Reproductive: Mild prostate enlargement is noted. Other: No ascites, focal collection or pneumoperitoneum. Musculoskeletal: A 50% compression fracture of T12 is identified with underlying lytic lesion. A probable lytic lesion within the posterior RIGHT iliac bone (image 106-series 3) is noted. IMPRESSION: 1. 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 Innumerable hepatic masses, mildly enlarged abdominal lymph nodes and lytic lesions involving T1, bilateral ribs, manubrium, RIGHT iliac bone and T12 noted compatible with diffuse metastatic disease. 50% pathologic fracture of T12. 2. Cholelithiasis without CT evidence of acute cholecystitis. Electronically Signed   By: Margarette Canada M.D.   On: 11/03/2019 14:12   US BIOPSY (LIVER)  Result Date: 11/05/2019 INDICATION: 54 year old male with imaging findings highly concerning for metastatic pancreatic adenocarcinoma. He presents for ultrasound-guided core biopsy of liver lesion to confirm. EXAM: ULTRASOUND BIOPSY CORE LIVER MEDICATIONS: None. ANESTHESIA/SEDATION: Moderate (conscious) sedation was employed  during this procedure. A total of Versed 2 mg and Fentanyl 100 mcg was administered intravenously. Moderate Sedation Time: 10 minutes. The patient's level of consciousness and vital signs were monitored continuously by radiology nursing throughout the procedure under my direct supervision. FLUOROSCOPY TIME:  None. COMPLICATIONS: None immediate. PROCEDURE: Informed written consent was obtained from the patient after a thorough discussion of the procedural  risks, benefits and alternatives. All questions were addressed. Maximal Sterile Barrier Technique was utilized including caps, mask, sterile gowns, sterile gloves, sterile drape, hand hygiene and skin antiseptic. A timeout was performed prior to the initiation of the procedure. Ultrasound was used to interrogate the liver. Numerous echogenic and centrally necrotic lesions are present throughout the hepatic parenchyma. A suitable lesion in hepatic segment 3 was identified. A suitable skin entry site was selected and marked. The overlying skin was sterilely prepped and draped in the standard fashion using chlorhexidine skin prep. Local anesthesia was attained by infiltration with 1% lidocaine. A small dermatotomy was made. Under real-time ultrasound guidance, a 17 gauge introducer needle was advanced through the liver and positioned at the margin of the mass. Multiple 18 gauge core biopsies were then coaxially obtained using the bio Pince automated biopsy device. Biopsy specimens were placed in formalin and delivered to pathology for further analysis. As the introducer needle was removed, the biopsy tract was embolized with a Gel-Foam slurry. The patient tolerated the procedure well. IMPRESSION: Ultrasound-guided core biopsy of liver lesion. Electronically Signed   By: Jacqulynn Cadet M.D.   On: 11/05/2019 17:37    Labs:  CBC: Recent Labs    11/04/19 0539 11/06/19 0557 11/14/19 0917 11/19/19 0959  WBC 18.3* 16.2* 23.3* 22.8*  HGB 8.9* 8.0* 8.5* 8.6*  HCT 30.2* 26.9* 28.4* 29.2*  PLT 279 185 281 378    COAGS: Recent Labs    11/04/19 0539  INR 1.3*  APTT 32    BMP: Recent Labs    11/04/19 0539 11/04/19 1242 11/06/19 0557 11/19/19 0959  NA 139 140 139 135  K 4.1 4.0 3.6 4.0  CL 107 109 105 103  CO2 25 23 23  21*  GLUCOSE 115* 149* 150* 132*  BUN 32* 28* 20 13  CALCIUM 12.2* 11.1* 10.0 10.2  CREATININE 1.52* 1.47* 1.33* 0.79  GFRNONAA 51* 53* >60 >60  GFRAA 59* >60 >60 >60     LIVER FUNCTION TESTS: Recent Labs    11/03/19 2006 11/04/19 0539 11/06/19 0557 11/19/19 0959  BILITOT 0.7 0.8 0.6 1.5*  AST 28 32 51* 48*  ALT 26 28 38 32  ALKPHOS 137* 145* 193* 431*  PROT 5.3* 5.7* 5.2* 6.6  ALBUMIN 2.3* 2.4* 2.2* 2.2*    TUMOR MARKERS: No results for input(s): AFPTM, CEA, CA199, CHROMGRNA in the last 8760 hours.  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 Lorenso Courier who presents today for port placement to begin systemic therapy.  Patient has been NPO since midnight except for a few sips of water with his medication at 12:30 this afternoon, he does not take blood thinning medications. Afebrile, WBC 26.9, hgb 8.7, plt 416, INR 1.3. Will review pre-procedure labs with Dr. Laurence Ferrari prior to proceeding.   Risks and benefits of image guided port-a-catheter placement were discussed with the patient including, but not limited to bleeding, infection, pneumothorax, or fibrin sheath development and need for additional procedures.  All of the patient's questions were answered, patient is agreeable to proceed.  Consent signed and in chart.  Thank you for this interesting consult.  I greatly enjoyed meeting Kenneth Khan. and look forward to participating in their care.  A copy of this report was sent to the requesting provider on this date.  Electronically Signed: Joaquim Nam, PA-C 11/19/2019, 1:21 PM   I spent a total of  30 Minutes in face to face in clinical consultation, greater than 50% of which was counseling/coordinating care for port placement.

## 2019-11-19 NOTE — Procedures (Signed)
Interventional Radiology Procedure Note  Procedure: Placement of a right IJ approach single lumen PowerPort.  Tip is positioned at the superior cavoatrial junction and catheter is ready for immediate use.  Complications: No immediate Recommendations:  - Ok to shower tomorrow - Do not submerge for 7 days - Routine line care   Signed,  Taliya Mcclard K. Kyrillos [facility], MD   

## 2019-11-22 ENCOUNTER — Other Ambulatory Visit: Payer: Self-pay | Admitting: Hematology and Oncology

## 2019-11-22 ENCOUNTER — Telehealth: Payer: Self-pay | Admitting: Hematology and Oncology

## 2019-11-22 DIAGNOSIS — C801 Malignant (primary) neoplasm, unspecified: Secondary | ICD-10-CM

## 2019-11-22 NOTE — Telephone Encounter (Signed)
Called the patient to discuss the findings of the EGD and sigmoid performed yesterday.  According to gastroenterologist there was no evidence of any malignancy in the esophagus or the anal cavity.  At this time we do not have a clear understanding of where the tumor originated.  As such I would recommend a PET CT scan to help elucidate the source of this tumor.  I would like to keep the scheduled chemotherapy start date of 12/03/2019 with FOLFOX presuming that this is an esophageal primary.  In the event that there is no evidence of tumor found in and around the esophagus we will need to consider other possibilities including head and neck or lung, though these would be extraordinarily unlikely given the lack of any lesions in the lungs or chest.  Kenneth Khan voiced his understanding of these findings.  [name], MD Department of Hematology/Oncology [facility] Phone: [phone] Pager: [phone] Email: [email or web address]

## 2019-11-26 ENCOUNTER — Telehealth: Payer: Self-pay | Admitting: Hematology and Oncology

## 2019-11-26 NOTE — Telephone Encounter (Signed)
Scheduled per los. Called and spoke with patient. Confirmed appts  

## 2019-11-28 ENCOUNTER — Telehealth: Payer: Self-pay | Admitting: Hematology and Oncology

## 2019-11-28 NOTE — Telephone Encounter (Signed)
Called pt per 1/13 sch message - r/s apt , unable to reach pt . Left message with appt date and time

## 2019-11-29 ENCOUNTER — Inpatient Hospital Stay: Payer: Managed Care, Other (non HMO)

## 2019-11-29 ENCOUNTER — Other Ambulatory Visit: Payer: Self-pay

## 2019-11-30 ENCOUNTER — Telehealth: Payer: Self-pay | Admitting: *Deleted

## 2019-11-30 ENCOUNTER — Other Ambulatory Visit: Payer: Self-pay | Admitting: *Deleted

## 2019-11-30 ENCOUNTER — Other Ambulatory Visit: Payer: Self-pay | Admitting: Hematology and Oncology

## 2019-11-30 MED ORDER — METHOCARBAMOL 750 MG PO TABS: 750.0000 mg | ORAL_TABLET | Freq: Three times a day (TID) | ORAL | 1 refills | Status: DC

## 2019-11-30 MED ORDER — FERROUS SULFATE 325 (65 FE) MG PO TABS: 325.0000 mg | ORAL_TABLET | Freq: Every day | ORAL | 3 refills | Status: AC

## 2019-11-30 NOTE — Telephone Encounter (Signed)
Received call from patient. He had some questions regarding refills on prescriptions on meds received at the time of discharge. Reviewed and updated pt's med list. Advised that for his cholesterol medications-he will need to contact the pharmacy or the provider who prescribed those originally.  The Methocarbamol and FeS04 were discharge medications along with the hydrocodone/APAP. Informed patient that I would inform Dr. Lorenso Courier about the need for refills on only the FeS04 and methocarbamol.  Pt states he has enough of the hydrocodone/apap for now Pt voiced understanding. Pt uses [facility] on [address]

## 2019-12-03 ENCOUNTER — Ambulatory Visit: Payer: Managed Care, Other (non HMO)

## 2019-12-03 ENCOUNTER — Other Ambulatory Visit: Payer: Managed Care, Other (non HMO)

## 2019-12-03 ENCOUNTER — Ambulatory Visit: Payer: Managed Care, Other (non HMO) | Admitting: Hematology and Oncology

## 2019-12-03 ENCOUNTER — Other Ambulatory Visit: Payer: Self-pay | Admitting: Hematology and Oncology

## 2019-12-04 ENCOUNTER — Other Ambulatory Visit: Payer: Self-pay

## 2019-12-04 ENCOUNTER — Ambulatory Visit (HOSPITAL_COMMUNITY)
Admission: RE | Admit: 2019-12-04 | Discharge: 2019-12-04 | Disposition: A | Discharge status: Home / Self Care | Payer: Managed Care, Other (non HMO) | Source: Ambulatory Visit | Attending: Hematology and Oncology | Admitting: Hematology and Oncology

## 2019-12-04 DIAGNOSIS — C801 Malignant (primary) neoplasm, unspecified: Secondary | ICD-10-CM | POA: Diagnosis present

## 2019-12-04 LAB — GLUCOSE, CAPILLARY: Glucose-Capillary: 115 mg/dL — ABNORMAL HIGH (ref 70–99)

## 2019-12-04 MED ORDER — FLUDEOXYGLUCOSE F - 18 (FDG) INJECTION
8.8800 | Freq: Once | INTRAVENOUS | Status: AC | PRN
  Administered 2019-12-04: 8.88 via INTRAVENOUS

## 2019-12-05 NOTE — Progress Notes (Signed)
[place] Telephone:([phone]   Fax:([phone]) [phone]  PROGRESS NOTE  Patient Care Team: Randa Evens as PCP - General (Physician Assistant)  Hematological/Oncological History #Metastatic Squamous Cell Cancer, Unclear Primary. Possible Squamous Cell Of Pancreatic Origin 1) 11/03/2019: presented to [name] after seeing his PCP the day prior. Noted to have lab abnormalities. CT Thoracic Spine/Lumbar shows lesion to T1 and compression fracture of T12. CT C/A/Pshowed4 x 5 cm ill-defined hypoechoic mass within the pancreatic tailandinnumerable low-density lesions throughout the liver noted and compatible with metastatic disease. Noted to have microcytic anemia and hypercalcemia to 13.8. 2) 11/05/2019: underwent US guided biopsy of liver lesion. Pathology confirmed metastatic squamous cell carcinoma. Requested EGD to confirm esophageal primary.  3) 11/21/2019: patient underwent EGD and sigmoidoscopy. No clear evidence of malignancy noted on these procedures. 4) 12/04/2019: PET CT scan showed mass in pancreas and liver, but no alternative sites of primary tumor.   Interval History:  Kenneth Khan. 54 y.o. male with medical history significant for metastatic squamous cell cancer of unclear origin who presents for a follow up visit. The patient's last visit was on 11/19/19. In the interim since the last visit he has undergone an EGD which showed no esophageal primary and a PET scan which did not elucidate an alternative primary site.   On exam today Kenneth Khan notes that his back pain is worsening.  He reports that it is 3-10 while sitting down, however if he were to him to stand and walk it would escalate up to 9 of the 10 in severity.  He notes that he has been using a walker at home that originally belonged to his mother-in-law.  He reports that he is having some instability on his feet but is unsure if it is neurological versus restricted due to increased pain.   Despite taking p.o. iron pills he reports that his bowels are moving regularly.  He attributes this partially to his good appetite and increased intake of fresh fruit and juice.  He reports that he has not had any issues with nausea, vomiting, or diarrhea.  His main concern today is his increasing back pain.  A full 10 point ROS is listed below.  Today we discussed the results of his PET CT scan, EGD, and repeat assessment of the original biopsy.  Unfortunately at this time it is unclear exactly what the primary tumor is, though it may represent a squamous cell of pancreatic origin.  I discussed the chemotherapy regimen moving forward and he voiced his understanding.  MEDICAL HISTORY:  Past Medical History:  Diagnosis Date  . Cancer ([name])   . Hypertension     SURGICAL HISTORY: Past Surgical History:  Procedure Laterality Date  . IR IMAGING GUIDED PORT INSERTION  11/19/2019  . NO PAST SURGERIES      SOCIAL HISTORY:  ALLERGIES:  has No Known Allergies.  MEDICATIONS:  Current Outpatient Medications  Medication Sig Dispense Refill  . aspirin EC 81 MG tablet Take 81 mg by mouth daily.    Marland Kitchen dexamethasone (DECADRON) 4 MG tablet Take 1 tablet (4 mg total) by mouth 3 (three) times daily. 90 tablet 0  . fenofibrate 54 MG tablet Take 54 mg by mouth daily.    . ferrous sulfate 325 (65 FE) MG tablet Take 1 tablet (325 mg total) by mouth daily with breakfast. Take with orange juice. 90 tablet 3  . HYDROcodone-acetaminophen (NORCO/VICODIN) 5-325 MG tablet Take 1-2 tablets by mouth every 6 (six)  hours as needed.    . lidocaine-prilocaine (EMLA) cream Apply 1 application topically as needed. 30 g 3  . lisinopril-hydrochlorothiazide (ZESTORETIC) 20-12.5 MG tablet Take 1 tablet by mouth daily.    . methocarbamol (ROBAXIN) 750 MG tablet Take 1 tablet (750 mg total) by mouth 3 (three) times daily. 45 tablet 1  . ondansetron (ZOFRAN) 8 MG tablet Take 1 tablet (8 mg total) by mouth every 8 (eight) hours as  needed for nausea or vomiting. 30 tablet 2  . oxyCODONE (OXY IR/ROXICODONE) 5 MG immediate release tablet Take 1-2 tablets (5-10 mg total) by mouth every 6 (six) hours as needed for severe pain. 60 tablet 0  . pravastatin (PRAVACHOL) 40 MG tablet Take 40 mg by mouth daily.    . prochlorperazine (COMPAZINE) 10 MG tablet Take 1 tablet (10 mg total) by mouth every 6 (six) hours as needed for nausea or vomiting. 30 tablet 2   No current facility-administered medications for this visit.    REVIEW OF SYSTEMS:   Constitutional: ( - ) fevers, ( - )  chills , ( - ) night sweats Eyes: ( - ) blurriness of vision, ( - ) double vision, ( - ) watery eyes Ears, nose, mouth, throat, and face: ( - ) mucositis, ( - ) sore throat Respiratory: ( - ) cough, ( - ) dyspnea, ( - ) wheezes Cardiovascular: ( - ) palpitation, ( - ) chest discomfort, ( - ) lower extremity swelling Gastrointestinal:  ( - ) nausea, ( - ) heartburn, ( - ) change in bowel habits Skin: ( - ) abnormal skin rashes Lymphatics: ( - ) new lymphadenopathy, ( - ) easy bruising Neurological: ( - ) numbness, ( - ) tingling, ( - ) new weaknesses Behavioral/Psych: ( - ) mood change, ( - ) new changes  All other systems were reviewed with the patient and are negative.  PHYSICAL EXAMINATION: ECOG PERFORMANCE STATUS: 2 - Symptomatic, <50% confined to bed  Vitals:   12/06/19 1115  BP: 113/77  Pulse: (!) 123  Resp: 15  Temp: 98.3 F (36.8 C)  SpO2: 96%   There were no vitals filed for this visit.  GENERAL: well appearing middle aged Caucasian male, alert, no distress and comfortable SKIN: skin color, texture, turgor are normal, no rashes or significant lesions EYES: conjunctiva are pink and non-injected, sclera clear LUNGS: clear to auscultation and percussion with normal breathing effort HEART: regular rate & rhythm and no murmurs and no lower extremity edema Musculoskeletal: no cyanosis of digits and no clubbing  PSYCH: alert & oriented x  3, fluent speech NEURO: no focal motor/sensory deficits  LABORATORY DATA:  I have reviewed the data as listed CBC Latest Ref Rng & Units 12/06/2019 11/19/2019 11/19/2019  WBC 4.0 - 10.5 K/uL 36.1(H) 26.9(H) 22.8(H)  Hemoglobin 13.0 - 17.0 g/dL 10.1(L) 8.7(L) 8.6(L)  Hematocrit 39.0 - 52.0 % 34.7(L) 29.8(L) 29.2(L)  Platelets 150 - 400 K/uL 395 416(H) 378    CMP Latest Ref Rng & Units 12/06/2019 11/19/2019 11/06/2019  Glucose 70 - 99 mg/dL 266(H) 132(H) 150(H)  BUN 6 - 20 mg/dL 13 13 20   Creatinine 0.61 - 1.24 mg/dL 0.77 0.79 1.33(H)  Sodium 135 - 145 mmol/L 132(L) 135 139  Potassium 3.5 - 5.1 mmol/L 3.6 4.0 3.6  Chloride 98 - 111 mmol/L 98 103 105  CO2 22 - 32 mmol/L 25 21(L) 23  Calcium 8.9 - 10.3 mg/dL 11.7(H) 10.2 10.0  Total Protein 6.5 - 8.1 g/dL 5.8(L) 6.6  5.2(L)  Total Bilirubin 0.3 - 1.2 mg/dL 2.2(H) 1.5(H) 0.6  Alkaline Phos 38 - 126 U/L 541(H) 431(H) 193(H)  AST 15 - 41 U/L 75(H) 48(H) 51(H)  ALT 0 - 44 U/L 43 32 38     RADIOGRAPHIC STUDIES: I have personally reviewed the radiological images as listed and agreed with the findings in the report: FDG avidity in the liver, pancreatic mass, and spine. No other clear primary site of tumor.  NM PET Image Initial (PI) Whole Body  Result Date: 12/04/2019 CLINICAL DATA:  Initial treatment strategy for cancer of unknown primary. EXAM: NUCLEAR MEDICINE PET WHOLE BODY TECHNIQUE: 8.89 mCi F-18 FDG was injected intravenously. Full-ring PET imaging was performed from the skull base to thigh after the radiotracer. CT data was obtained and used for attenuation correction and anatomic localization. Fasting blood glucose: 115 mg/dl COMPARISON:  CT chest, abdomen and pelvis from 11/03/2019. FINDINGS: Mediastinal blood pool activity: SUV max 2.37 HEAD/NECK: FDG avid lytic lesion within the right temporal bone measures 1.3 cm and has an SUV max of 9.29. No hypermetabolic lymph nodes within the soft tissues of the neck. Incidental CT findings: none  CHEST: Right supraclavicular node measures 9 mm short axis and has an SUV max 4.4. No hypermetabolic axillary, mediastinal, or hilar lymph nodes. Moderate bilateral pleural effusions are identified with overlying passive atelectasis. No hypermetabolic mass within the lungs. Mild asymmetric increased uptake along the pleura within the posterior costophrenic sulcus is identified with SUV max of 5.28. Incidental CT findings: none ABDOMEN/PELVIS: FDG avid mass within the tail of pancreas measures 6.7 x 5.0 cm and has an SUV max of 9.38. The tumor extends up to and may involve the splenic hilum. Extensive liver metastasis.  Lesions are too numerous to count. Index lesion within left lobe of liver measures 7.4 by 6.2 cm and has an SUV max of 10.2. Posteromedial right hepatic lobe lesion measures 4.2 cm, image 135/4 and has an SUV max of 7.1. The adrenal glands are unremarkable.  No suspicious kidney mass. FDG avid lymph nodes are identified within the upper abdomen. -index peripancreatic node measures 1.4 cm short axis and has an SUV max of 5.9. -index portacaval node measures 1.2 cm within SUV max of 5.16. Small to moderate volume of abdominopelvic ascites identified. Incidental CT findings: Gallstone identified. SKELETON: Multifocal FDG avid bone lesions are noted involving the axial and proximal appendicular skeleton. -expansile, lytic bone lesion with pathologic fracture involving the posterior aspect of the fifth rib measures 2.5 cm and has an SUV max of 8.26. -lucent lesion involving the inferior tip of the body of right scapula measures 1.4 cm with SUV max of 9.1. -lucent lesion within the posterior column of the left acetabulum is also noted within SUV max of 10.24. -lytic lesion involving the L1 vertebra is identified with SUV max of 8.0. Described on MRI from 11/05/2019 is tumor involving T12 and L1 again noted. There has been progression of compression deformity at T12 with FDG avid soft tissue within the  canal. This may be of neurologic significance. SUV max within the L1 vertebra is equal to 10.5. Incidental CT findings: none EXTREMITIES: Of potential orthopedic significance is a lytic lesion involving the femoral neck of the proximal left femur measuring 2.2 cm within SUV max of 5.98. also concerning is a 4.3 cm lytic lesion is identified involving the posterior cortex of the proximal diaphysis of the left femur within SUV max of 9.12 Incidental CT findings: none IMPRESSION: 1. Within  the distal tail of pancreas there is a large FDG avid mass worrisome for primary pancreas adenocarcinoma. 2. Extensive FDG avid liver metastasis and upper abdominal nodal metastasis. 3. Moderate bilateral pleural effusions identified. Increased pleural uptake within the left posterior costophrenic sulcus is noted which may reflect underlying malignant effusion. 4. Multifocal FDG avid lytic bone metastases. Progressive compression deformity involving the T12 vertebra is noted and there is increased, FDG avid soft tissue within the canal at this level. This may be of neurologic significance. FDG avid T1 metastasis also noted corresponding to lucent lesion on the CT images. 5. Left proximal femur FDG avid lytic lesions are noted. Although no pathologic fractures present at this time these may be of orthopedic significance in the future. 6. Hypermetabolic lytic lesion is noted involving the right temporal bone which erodes into the mastoid air cells. Electronically Signed   By: Kerby Moors M.D.   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 He presents for portacatheter placement. EXAM: IMPLANTED PORT A CATH PLACEMENT WITH ULTRASOUND AND FLUOROSCOPIC GUIDANCE MEDICATIONS: 2 g Ancef; The antibiotic was administered within an appropriate time interval prior to skin puncture.  ANESTHESIA/SEDATION: Versed 3 mg IV; Fentanyl 200 mcg IV; Moderate Sedation Time:  26 minutes The patient was continuously monitored during the procedure by the interventional radiology nurse under my direct supervision. FLUOROSCOPY TIME:  0 minutes, 30 seconds (5 mGy) COMPLICATIONS: None immediate. PROCEDURE: The right neck and chest was prepped with chlorhexidine, and draped in the usual sterile fashion using maximum barrier technique (cap and mask, sterile gown, sterile gloves, large sterile sheet, hand hygiene and cutaneous antiseptic). Local anesthesia was attained by infiltration with 1% lidocaine with epinephrine. Ultrasound demonstrated patency of the right internal jugular vein, and this was documented with an image. Under real-time ultrasound guidance, this vein was accessed with a 21 gauge micropuncture needle and image documentation was performed. A small dermatotomy was made at the access site with an 11 scalpel. A 0.018" wire was advanced into the SVC and the access needle exchanged for a 98F micropuncture vascular sheath. The 0.018" wire was then removed and a 0.035" wire advanced into the IVC. An appropriate location for the subcutaneous reservoir was selected below the clavicle and an incision was made through the skin and underlying soft tissues. The subcutaneous tissues were then dissected using a combination of blunt and sharp surgical technique and a pocket was formed. A single lumen power injectable portacatheter was then tunneled through the subcutaneous tissues from the pocket to the dermatotomy and the port reservoir placed within the subcutaneous pocket. The venous access site was then serially dilated and a peel away vascular sheath placed over the wire. The wire was removed and the port catheter advanced into position under fluoroscopic guidance. The catheter tip is positioned in the superior cavoatrial junction. This was documented with a spot image. The portacatheter was then tested and  found to flush and aspirate well. The port was flushed with saline followed by 100 units/mL heparinized saline. The pocket was then closed in two layers using first subdermal inverted interrupted absorbable sutures followed by a running subcuticular suture. The epidermis was then sealed with Dermabond. The dermatotomy at the venous access site was also closed with Dermabond. IMPRESSION: Successful placement of a right IJ approach Power Port with ultrasound and fluoroscopic guidance. The catheter is ready for use. Electronically Signed  By: Jacqulynn Cadet M.D.   On: 11/19/2019 16:07    ASSESSMENT & PLAN Kenneth Khan. 54 y.o. male with medical history significant for metastatic squamous cell cancer of unclear origin presenting for a follow up visit.    Further extensive work-up including an EGD, sigmoidoscopy, and PET CT scan have not helped [place] to identify the primary tumor.  It is highly unlikely that this squamous cell cancer is of pancreatic origin, although squamous cell carcinoma of the pancreas is a incredibly rare entity.  We have sought to prove that the primary tumor was esophageal versus lung versus anal, however our work-up has been unrewarding.  As such we will plan to move forward with a generalized regimen for squamous cell cancer.  Additional concern we have is that the patient is currently experiencing worsening back pain with PET CT scan confirming encroachment on the neural canal.  We have the patient scheduled to visit with radiation oncology later today to help evaluate this.  Today the patient and I discussed Carbo/paclitaxel chemotherapy and the nature of that treatment.  I discussed that this is a lifelong condition and that the goal of therapy is to slow down the growth of the tumor, but it is not capable of completely removing it from his body.  As such I noted that this is a palliative chemotherapy with the intention of reducing symptoms and extending life, but not a curative  therapy.  I discussed an overview of the symptoms to be expected including nausea, vomiting, neuropathy, cold intolerance, weight loss, and poor appetite.  The patient voices understanding of the symptoms.  Current plan is for Carboplatin AUC 5 and Paclitaxel 294m/m2 q21 days until progression or intolerance. This is a generalized regimen for his squamous cell cancer of unclear primary. In the event the patient were to have progression we could consider therapy with Pembrolizumab. We can have his tissue tested for PD-L1 status to prepare for that eventuality. Plan for treatment to start on 12/17/2019.   #Metastatic Squamous Cell Cancer, Uncertain Primary --plan to proceed with Carboplatin/Paclitaxel therapy for squamous cell cancer of unclear etiology. This generalized regimen should help to cover most possible etiologies of the squamous cell cancer.  --requesting pathology do a final pathology review with IHC staining to assure the cancer is squamous given the unusual picture and presentation --potentially represents a rare squamous cell of pancreatic primary.  --PET CT scan performed on 12/04/19. Last CA 19-9 1607 on 11/03/2019.  --RTC in 4 weeks time or sooner if indicated by labs or inability to tolerate treatment.   #Iron Deficiency Anemia, improving --likely 2/2 to blood loss from active malignancy in the GI tract, presumed esophageal cancer --continue PO iron sulfate 3237mdaily as prescribed. 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 Will plan for repeat dose of zometa on 12/07/2019 --referral to radiation oncology today for consideration of palliative radiation to  his spine lesion  #Symptom Control --no current nausea, vomiting, diarrhea --continue to monitor. Will provide with anti-nausea medications including compazine and zofran today --continue EMLA cream for port soreness PRN  All questions were answered. The patient knows to call the clinic with any problems,  questions or concerns.  A total of more than 40 minutes were spent on this encounter and over half of that time was spent on counseling and coordination of care as outlined above.   [name], MD Department of Hematology/Oncology [place] at [facility] Phone: [phone] Pager: [phone] Email: [email or web address]  12/06/2019 5:34 PM   Literature Support:  Al-Shehri Waymon Budge. Squamous cell carcinoma of the pancreas. Curr Oncol. 2008;15(6):293-297. Doi:[id number] --The squamous cell carcinoma pathology is a rare entity, and few reports of it are found in the literature. As a result, treatment options for squamous cell carcinoma of the pancreas are poorly understood.

## 2019-12-06 ENCOUNTER — Encounter: Payer: Self-pay | Admitting: Hematology and Oncology

## 2019-12-06 ENCOUNTER — Other Ambulatory Visit: Payer: Self-pay | Admitting: *Deleted

## 2019-12-06 ENCOUNTER — Inpatient Hospital Stay (HOSPITAL_BASED_OUTPATIENT_CLINIC_OR_DEPARTMENT_OTHER): Payer: Managed Care, Other (non HMO) | Admitting: Hematology and Oncology

## 2019-12-06 ENCOUNTER — Ambulatory Visit
Admission: RE | Admit: 2019-12-06 | Discharge: 2019-12-06 | Disposition: A | Discharge status: Home / Self Care | Payer: Managed Care, Other (non HMO) | Source: Ambulatory Visit | Attending: Radiation Oncology | Admitting: Radiation Oncology

## 2019-12-06 ENCOUNTER — Other Ambulatory Visit: Payer: Self-pay

## 2019-12-06 ENCOUNTER — Inpatient Hospital Stay: Payer: Managed Care, Other (non HMO)

## 2019-12-06 ENCOUNTER — Encounter: Payer: Self-pay | Admitting: Radiation Oncology

## 2019-12-06 ENCOUNTER — Telehealth: Payer: Self-pay | Admitting: *Deleted

## 2019-12-06 VITALS — BP 101/79 | HR 118 | Temp 98.3°F | Resp 20

## 2019-12-06 VITALS — BP 113/77 | HR 123 | Temp 98.3°F | Resp 15

## 2019-12-06 DIAGNOSIS — C801 Malignant (primary) neoplasm, unspecified: Secondary | ICD-10-CM | POA: Insufficient documentation

## 2019-12-06 DIAGNOSIS — Z95828 Presence of other vascular implants and grafts: Secondary | ICD-10-CM | POA: Insufficient documentation

## 2019-12-06 DIAGNOSIS — Z79899 Other long term (current) drug therapy: Secondary | ICD-10-CM | POA: Insufficient documentation

## 2019-12-06 DIAGNOSIS — C7951 Secondary malignant neoplasm of bone: Secondary | ICD-10-CM | POA: Insufficient documentation

## 2019-12-06 DIAGNOSIS — I10 Essential (primary) hypertension: Secondary | ICD-10-CM | POA: Insufficient documentation

## 2019-12-06 DIAGNOSIS — K8689 Other specified diseases of pancreas: Secondary | ICD-10-CM

## 2019-12-06 DIAGNOSIS — J90 Pleural effusion, not elsewhere classified: Secondary | ICD-10-CM | POA: Insufficient documentation

## 2019-12-06 DIAGNOSIS — M545 Low back pain: Secondary | ICD-10-CM | POA: Diagnosis not present

## 2019-12-06 DIAGNOSIS — C787 Secondary malignant neoplasm of liver and intrahepatic bile duct: Secondary | ICD-10-CM | POA: Insufficient documentation

## 2019-12-06 DIAGNOSIS — Z7982 Long term (current) use of aspirin: Secondary | ICD-10-CM | POA: Diagnosis not present

## 2019-12-06 DIAGNOSIS — C159 Malignant neoplasm of esophagus, unspecified: Secondary | ICD-10-CM

## 2019-12-06 LAB — CMP (CANCER CENTER ONLY)
ALT: 43 U/L (ref 0–44)
AST: 75 U/L — ABNORMAL HIGH (ref 15–41)
Albumin: 1.7 g/dL — ABNORMAL LOW (ref 3.5–5.0)
Alkaline Phosphatase: 541 U/L — ABNORMAL HIGH (ref 38–126)
Anion gap: 9 (ref 5–15)
BUN: 13 mg/dL (ref 6–20)
CO2: 25 mmol/L (ref 22–32)
Calcium: 11.7 mg/dL — ABNORMAL HIGH (ref 8.9–10.3)
Chloride: 98 mmol/L (ref 98–111)
Creatinine: 0.77 mg/dL (ref 0.61–1.24)
GFR, Est AFR Am: >60 mL/min (ref >60)
GFR, Estimated: >60 mL/min (ref >60)
Glucose, Bld: 266 mg/dL — ABNORMAL HIGH (ref 70–99)
Potassium: 3.6 mmol/L (ref 3.5–5.1)
Sodium: 132 mmol/L — ABNORMAL LOW (ref 135–145)
Total Bilirubin: 2.2 mg/dL — ABNORMAL HIGH (ref 0.3–1.2)
Total Protein: 5.8 g/dL — ABNORMAL LOW (ref 6.5–8.1)

## 2019-12-06 LAB — CBC WITH DIFFERENTIAL (CANCER CENTER ONLY)
Abs Immature Granulocytes: 0.27 10*3/uL — ABNORMAL HIGH (ref 0.00–0.07)
Basophils Absolute: 0.1 10*3/uL (ref 0.0–0.1)
Basophils Relative: 0 %
Eosinophils Absolute: 0 10*3/uL (ref 0.0–0.5)
Eosinophils Relative: 0 %
HCT: 34.7 % — ABNORMAL LOW (ref 39.0–52.0)
Hemoglobin: 10.1 g/dL — ABNORMAL LOW (ref 13.0–17.0)
Immature Granulocytes: 1 %
Lymphocytes Relative: 2 %
Lymphs Abs: 0.8 10*3/uL (ref 0.7–4.0)
MCH: 21.6 pg — ABNORMAL LOW (ref 26.0–34.0)
MCHC: 29.1 g/dL — ABNORMAL LOW (ref 30.0–36.0)
MCV: 74.3 fL — ABNORMAL LOW (ref 80.0–100.0)
Monocytes Absolute: 2 10*3/uL — ABNORMAL HIGH (ref 0.1–1.0)
Monocytes Relative: 5 %
Neutro Abs: 33 10*3/uL — ABNORMAL HIGH (ref 1.7–7.7)
Neutrophils Relative %: 92 %
Platelet Count: 395 10*3/uL (ref 150–400)
RBC: 4.67 MIL/uL (ref 4.22–5.81)
RDW: 21.4 % — ABNORMAL HIGH (ref 11.5–15.5)
WBC Count: 36.1 10*3/uL — ABNORMAL HIGH (ref 4.0–10.5)
nRBC: 0 % (ref 0.0–0.2)

## 2019-12-06 LAB — SAMPLE TO BLOOD BANK

## 2019-12-06 LAB — SURGICAL PATHOLOGY

## 2019-12-06 MED ORDER — PROCHLORPERAZINE MALEATE 10 MG PO TABS: 10.0000 mg | ORAL_TABLET | Freq: Four times a day (QID) | ORAL | 2 refills | Status: AC | PRN

## 2019-12-06 MED ORDER — HEPARIN SOD (PORK) LOCK FLUSH 100 UNIT/ML IV SOLN
500.0000 [IU] | Freq: Once | INTRAVENOUS | Status: AC
  Administered 2019-12-06: 12:00:00 500 [IU] via INTRAVENOUS
  Filled 2019-12-06: qty 5

## 2019-12-06 MED ORDER — METHOCARBAMOL 750 MG PO TABS: 750.0000 mg | ORAL_TABLET | Freq: Three times a day (TID) | ORAL | 1 refills | Status: AC

## 2019-12-06 MED ORDER — HEPARIN SOD (PORK) LOCK FLUSH 100 UNIT/ML IV SOLN
500.0000 [IU] | Freq: Once | INTRAVENOUS | Status: AC | PRN
  Administered 2019-12-06: 500 [IU]
  Filled 2019-12-06: qty 5

## 2019-12-06 MED ORDER — SODIUM CHLORIDE 0.9% FLUSH
10.0000 mL | INTRAVENOUS | Status: DC | PRN
  Administered 2019-12-06: 10 mL
  Filled 2019-12-06: qty 10

## 2019-12-06 MED ORDER — DEXAMETHASONE 4 MG PO TABS: 4.0000 mg | ORAL_TABLET | Freq: Three times a day (TID) | ORAL | 0 refills | Status: AC

## 2019-12-06 MED ORDER — OXYCODONE HCL 5 MG PO TABS: 5.0000 mg | ORAL_TABLET | Freq: Four times a day (QID) | ORAL | 0 refills | Status: DC | PRN

## 2019-12-06 MED ORDER — SODIUM CHLORIDE 0.9% FLUSH
10.0000 mL | Freq: Once | INTRAVENOUS | Status: AC
  Administered 2019-12-06: 12:00:00 10 mL via INTRAVENOUS
  Filled 2019-12-06: qty 10

## 2019-12-06 MED ORDER — LIDOCAINE-PRILOCAINE 2.5-2.5 % EX CREA: 1.0000 "application " | TOPICAL_CREAM | CUTANEOUS | 3 refills | Status: AC | PRN

## 2019-12-06 MED ORDER — ONDANSETRON HCL 8 MG PO TABS: 8.0000 mg | ORAL_TABLET | Freq: Three times a day (TID) | ORAL | 2 refills | Status: AC | PRN

## 2019-12-06 NOTE — Progress Notes (Signed)
Patient in for consult for new onset bone mets with pain. He is having issues walking due to pain causing weakness in his legs.

## 2019-12-06 NOTE — Patient Instructions (Signed)
Coronavirus (COVID-19) Are you at risk?  Are you at risk for the Coronavirus (COVID-19)?  To be considered HIGH RISK for Coronavirus (COVID-19), you have to meet the following criteria:  . Traveled to [place], [place], [place], [place] or [place]; or in the [place] to [place], [place], [place], or [place]; and have fever, cough, and shortness of breath within the last 2 weeks of travel OR . Been in close contact with a person diagnosed with COVID-19 within the last 2 weeks and have fever, cough, and shortness of breath . IF YOU DO NOT MEET THESE CRITERIA, YOU ARE CONSIDERED LOW RISK FOR COVID-19.  What to do if you are HIGH RISK for COVID-19?  . If you are having a medical emergency, call 911. . Seek medical care right away. Before you go to a doctor's office, urgent care or emergency department, call ahead and tell them about your recent travel, contact with someone diagnosed with COVID-19, and your symptoms. You should receive instructions from your physician's office regarding next steps of care.  . When you arrive at healthcare provider, tell the healthcare staff immediately you have returned from visiting [place], [place], [place], [place] or [place]; or traveled in the [place] to [place], [place], [place], or [place]; in the last two weeks or you have been in close contact with a person diagnosed with COVID-19 in the last 2 weeks.   . Tell the health care staff about your symptoms: fever, cough and shortness of breath. . After you have been seen by a medical provider, you will be either: o Tested for (COVID-19) and discharged home on quarantine except to seek medical care if symptoms worsen, and asked to  - Stay home and avoid contact with others until you get your results (4-5 days)  - Avoid travel on public transportation if possible (such as bus, train, or airplane) or o Sent to the Emergency Department by EMS for evaluation, COVID-19 testing, and possible  admission depending on your condition and test results.  What to do if you are LOW RISK for COVID-19?  Reduce your risk of any infection by using the same precautions used for avoiding the common cold or flu:  . Wash your hands often with soap and warm water for at least 20 seconds.  If soap and water are not readily available, use an alcohol-based hand sanitizer with at least 60% alcohol.  . If coughing or sneezing, cover your mouth and nose by coughing or sneezing into the elbow areas of your shirt or coat, into a tissue or into your sleeve (not your hands). . Avoid shaking hands with others and consider head nods or verbal greetings only. . Avoid touching your eyes, nose, or mouth with unwashed hands.  . Avoid close contact with people who are sick. . Avoid places or events with large numbers of people in one location, like concerts or sporting events. . Carefully consider travel plans you have or are making. . If you are planning any travel outside or inside the [place], visit the CDC's Travelers' Health webpage for the latest health notices. . If you have some symptoms but not all symptoms, continue to monitor at home and seek medical attention if your symptoms worsen. . If you are having a medical emergency, call 911.   ADDITIONAL HEALTHCARE OPTIONS FOR PATIENTS  [place] [facility] / e-Visit: [email or web address]         [facility]: [phone]  [facility]: [phone]                   [facility]: [phone]   

## 2019-12-06 NOTE — Telephone Encounter (Signed)
TCT patient. Spoke with him and advised that Dr. Lorenso Courier would like to have him come to cancer center tomorrow morning for IV fluids and Zometa.  Explained that his Ca++ level is increased and to correct it he needs both IVF and zometa.  Advised of arrival time @ 10:15 am and he will be done by the time he needs to go to RadOnc for his CT Sim.  Pt voiced understanding and states he will be here tomorrow morning.  Dr. Lorenso Courier made aware.

## 2019-12-06 NOTE — Progress Notes (Signed)
Radiation Oncology         [phone]) [phone] ________________________________  Name: Kenneth Khan.        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   HISTORY OF PRESENT ILLNESS: Kenneth Khan. is a 54 y.o. male seen at the request of Dr. 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  The patient had been experiencing increasing low back pain and was seen in the ER on 11/03/2019.  A compression fracture was seen at T12 and further imaging with CT of the chest abdomen pelvis lumbar and thoracic spine revealed concerns for a metastatic pattern with infiltrative lesions at T1, T12 and left posterior fifth rib, he had a pathologic fracture at T12 with concerns for epidural tumor extension. On his body CTs he was found to have a 5 cm pancreatic tail mass suspicious for pancreatic malignancy with innumerable hepatic metastases, mildly enlarged abdominal nodes and additional lytic changes in bilateral ribs, and manubrium, right iliac bone and no disease seen in the chest.  A bone scan on 11/04/2019 confirmed bony metastatic disease that is almost entirely lytic he did undergo a ultrasound-guided biopsy of one of the liver lesions on 11/05/2019 which revealed a metastatic squamous cell carcinoma.  An MRI of the spine that day also confirmed the metastatic disease at T1, T12 and L1 with moderate compression deformity of T12 and mild ventral epidural extension without cord compression.  He was referred to oncology and has undergone port placement on 11/19/2019. An EGD and colonoscopy were performed on 11/21/19 that did not show evidence of a malignant primary. He did have a PET scan performed on 12/04/2019 which reveals PET avid findings in the right  temporal bone measuring 1.3 cm with an SUV of 9.29, a right supraclavicular node measuring 9 mm with an SUV max of 4.4, increased uptake along the pleura within the posterior costophrenic sulcus with an SUV max of 5.28, the mass in the tail of the pancreas was also evaluated and measured 6.7 x 5 cm with an SUV max of 9.38 it extends up to and may involve the splenic hilum, extensive liver disease was noted with lesions too numerous to count but an index in the left lobe measuring 7.4 x 6.2 cm had an SUV max of [id number] hypermetabolic adenopathy was noted and a peripancreatic node, portacaval node, and multiple lesions within the axial and appendicular skeleton. In particular he does have destructive changes of the left upper femur and acetabular region. He saw Dr. Lorenso Courier today to discuss the options of systemic therapy, given the concern for impending compromise to his thoracic spinal cord he is seen today.  PREVIOUS RADIATION THERAPY: No   PAST MEDICAL HISTORY:  Past Medical History:  Diagnosis Date  . Cancer ([place])   . Hypertension        PAST SURGICAL HISTORY: Past Surgical History:  Procedure Laterality Date  . IR IMAGING GUIDED PORT INSERTION  11/19/2019  . NO PAST SURGERIES       FAMILY HISTORY:  Family History  Problem Relation Age of Onset  . Non-Hodgkin's lymphoma Father      SOCIAL HISTORY:  reports that he has never smoked. He has never used smokeless tobacco. He  reports current alcohol use. He reports that he does not use drugs.  The patient is single and lives in [place]. He is engaged. He does not have children and works in Press photographer for [employer].    ALLERGIES: Patient has no known allergies.   MEDICATIONS:  Current Outpatient Medications  Medication Sig Dispense Refill  . aspirin EC 81 MG tablet Take 81 mg by mouth daily.    . fenofibrate 54 MG tablet Take 54 mg by mouth daily.    . ferrous sulfate 325 (65 FE) MG tablet Take 1 tablet (325 mg total) by mouth  daily with breakfast. Take with orange juice. 90 tablet 3  . HYDROcodone-acetaminophen (NORCO/VICODIN) 5-325 MG tablet Take 1-2 tablets by mouth every 6 (six) hours as needed.    . lidocaine-prilocaine (EMLA) cream Apply 1 application topically as needed. 30 g 3  . ondansetron (ZOFRAN) 8 MG tablet Take 1 tablet (8 mg total) by mouth every 8 (eight) hours as needed for nausea or vomiting. 30 tablet 2  . pravastatin (PRAVACHOL) 40 MG tablet Take 40 mg by mouth daily.    . prochlorperazine (COMPAZINE) 10 MG tablet Take 1 tablet (10 mg total) by mouth every 6 (six) hours as needed for nausea or vomiting. 30 tablet 2  . dexamethasone (DECADRON) 4 MG tablet Take 1 tablet (4 mg total) by mouth 3 (three) times daily. 90 tablet 0  . lisinopril-hydrochlorothiazide (ZESTORETIC) 20-12.5 MG tablet Take 1 tablet by mouth daily.    . methocarbamol (ROBAXIN) 750 MG tablet Take 1 tablet (750 mg total) by mouth 3 (three) times daily. 45 tablet 1  . oxyCODONE (OXY IR/ROXICODONE) 5 MG immediate release tablet Take 1-2 tablets (5-10 mg total) by mouth every 6 (six) hours as needed for severe pain. 60 tablet 0   No current facility-administered medications for this encounter.     REVIEW OF SYSTEMS: On review of systems, the patient reports that he is doing okay. His main complaint is really pain in the lower back. It is quite constant and he has been sleeping on the couch trying to get comfortable.  He denies any numbness or tingling of his torso or lower extremities, loss of sensation or loss of motor function. He denies any chest pain, shortness of breath. He has had about 10 pounds of unintentional weight loss. He has had several episodes of fevers, but denies chills, night sweats. He denies any bowel or bladder disturbances, and denies abdominal pain, nausea or vomiting. He denies any other new musculoskeletal or joint aches or pains. A complete review of systems is obtained and is otherwise negative.     PHYSICAL  EXAM:  Wt Readings from Last 3 Encounters:  11/19/19 179 lb 11.2 oz (81.5 kg)  11/14/19 182 lb 4.8 oz (82.7 kg)  11/03/19 185 lb (83.9 kg)   Temp Readings from Last 3 Encounters:  12/06/19 98.3 F (36.8 C)  12/06/19 98.3 F (36.8 C) (Temporal)  11/19/19 98.1 F (36.7 C) (Oral)   BP Readings from Last 3 Encounters:  12/06/19 101/79  12/06/19 113/77  11/19/19 112/72   Pulse Readings from Last 3 Encounters:  12/06/19 (!) 118  12/06/19 (!) 123  11/19/19 95   Pain Assessment Pain Score: 3  Pain Frequency: Constant Pain Loc: Back/10  In general this is a well appearing caucasian male in no acute distress. He's alert and oriented x4 and appropriate throughout the examination. Cardiopulmonary assessment is negative for acute distress and he exhibits normal  effort.    ECOG = 1  0 - Asymptomatic (Fully active, able to carry on all predisease activities without restriction)  1 - Symptomatic but completely ambulatory (Restricted in physically strenuous activity but ambulatory and able to carry out work of a light or sedentary nature. For example, light housework, office work)  2 - Symptomatic, <50% in bed during the day (Ambulatory and capable of all self care but unable to carry out any work activities. Up and about more than 50% of waking hours)  3 - Symptomatic, >50% in bed, but not bedbound (Capable of only limited self-care, confined to bed or chair 50% or more of waking hours)  4 - Bedbound (Completely disabled. Cannot carry on any self-care. Totally confined to bed or chair)  5 - Death   Eustace Pen MM, Creech RH, Tormey DC, et al. [phone]). "Toxicity and response criteria of the [employer]". [place] Oncol. 5 (6): 649-55    LABORATORY DATA:  Lab Results  Component Value Date   WBC 36.1 (H) 12/06/2019   HGB 10.1 (L) 12/06/2019   HCT 34.7 (L) 12/06/2019   MCV 74.3 (L) 12/06/2019   PLT 395 12/06/2019   Lab Results  Component Value Date   NA 132  (L) 12/06/2019   K 3.6 12/06/2019   CL 98 12/06/2019   CO2 25 12/06/2019   Lab Results  Component Value Date   ALT 43 12/06/2019   AST 75 (H) 12/06/2019   ALKPHOS 541 (H) 12/06/2019   BILITOT 2.2 (H) 12/06/2019      RADIOGRAPHY: NM PET Image Initial (PI) Whole Body  Result Date: 12/04/2019 CLINICAL DATA:  Initial treatment strategy for cancer of unknown primary. EXAM: NUCLEAR MEDICINE PET WHOLE BODY TECHNIQUE: 8.89 mCi F-18 FDG was injected intravenously. Full-ring PET imaging was performed from the skull base to thigh after the radiotracer. CT data was obtained and used for attenuation correction and anatomic localization. Fasting blood glucose: 115 mg/dl COMPARISON:  CT chest, abdomen and pelvis from 11/03/2019. FINDINGS: Mediastinal blood pool activity: SUV max 2.37 HEAD/NECK: FDG avid lytic lesion within the right temporal bone measures 1.3 cm and has an SUV max of 9.29. No hypermetabolic lymph nodes within the soft tissues of the neck. Incidental CT findings: none CHEST: Right supraclavicular node measures 9 mm short axis and has an SUV max 4.4. No hypermetabolic axillary, mediastinal, or hilar lymph nodes. Moderate bilateral pleural effusions are identified with overlying passive atelectasis. No hypermetabolic mass within the lungs. Mild asymmetric increased uptake along the pleura within the posterior costophrenic sulcus is identified with SUV max of 5.28. Incidental CT findings: none ABDOMEN/PELVIS: FDG avid mass within the tail of pancreas measures 6.7 x 5.0 cm and has an SUV max of 9.38. The tumor extends up to and may involve the splenic hilum. Extensive liver metastasis.  Lesions are too numerous to count. Index lesion within left lobe of liver measures 7.4 by 6.2 cm and has an SUV max of 10.2. Posteromedial right hepatic lobe lesion measures 4.2 cm, image 135/4 and has an SUV max of 7.1. The adrenal glands are unremarkable.  No suspicious kidney mass. FDG avid lymph nodes are identified  within the upper abdomen. -index peripancreatic node measures 1.4 cm short axis and has an SUV max of 5.9. -index portacaval node measures 1.2 cm within SUV max of 5.16. Small to moderate volume of abdominopelvic ascites identified. Incidental CT findings: Gallstone identified. SKELETON: Multifocal FDG avid bone lesions are noted involving the  axial and proximal appendicular skeleton. -expansile, lytic bone lesion with pathologic fracture involving the posterior aspect of the fifth rib measures 2.5 cm and has an SUV max of 8.26. -lucent lesion involving the inferior tip of the body of right scapula measures 1.4 cm with SUV max of 9.1. -lucent lesion within the posterior column of the left acetabulum is also noted within SUV max of 10.24. -lytic lesion involving the L1 vertebra is identified with SUV max of 8.0. Described on MRI from 11/05/2019 is tumor involving T12 and L1 again noted. There has been progression of compression deformity at T12 with FDG avid soft tissue within the canal. This may be of neurologic significance. SUV max within the L1 vertebra is equal to 10.5. Incidental CT findings: none EXTREMITIES: Of potential orthopedic significance is a lytic lesion involving the femoral neck of the proximal left femur measuring 2.2 cm within SUV max of 5.98. also concerning is a 4.3 cm lytic lesion is identified involving the posterior cortex of the proximal diaphysis of the left femur within SUV max of 9.12 Incidental CT findings: none IMPRESSION: 1. Within the distal tail of pancreas there is a large FDG avid mass worrisome for primary pancreas adenocarcinoma. 2. Extensive FDG avid liver metastasis and upper abdominal nodal metastasis. 3. Moderate bilateral pleural effusions identified. Increased pleural uptake within the left posterior costophrenic sulcus is noted which may reflect underlying malignant effusion. 4. Multifocal FDG avid lytic bone metastases. Progressive compression deformity involving the T12  vertebra is noted and there is increased, FDG avid soft tissue within the canal at this level. This may be of neurologic significance. FDG avid T1 metastasis also noted corresponding to lucent lesion on the CT images. 5. Left proximal femur FDG avid lytic lesions are noted. Although no pathologic fractures present at this time these may be of orthopedic significance in the future. 6. Hypermetabolic lytic lesion is noted involving the right temporal bone which erodes into the mastoid air cells. Electronically Signed   By: Kerby Moors M.D.   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 He presents for portacatheter placement. EXAM: IMPLANTED PORT A CATH PLACEMENT WITH ULTRASOUND AND FLUOROSCOPIC GUIDANCE MEDICATIONS: 2 g Ancef; The antibiotic was administered within an appropriate time interval prior to skin puncture. ANESTHESIA/SEDATION: Versed 3 mg IV; Fentanyl 200 mcg IV; Moderate Sedation Time:  26 minutes The patient was continuously monitored during the procedure by the interventional radiology nurse under my direct supervision. FLUOROSCOPY TIME:  0 minutes, 30 seconds (5 mGy) COMPLICATIONS: None immediate. PROCEDURE: The right neck and chest was prepped with chlorhexidine, and draped in the usual sterile fashion using maximum barrier technique (cap and mask, sterile gown, sterile gloves, large sterile sheet, hand hygiene and cutaneous antiseptic). Local anesthesia was attained by infiltration with 1% lidocaine with epinephrine. Ultrasound demonstrated patency of the right internal jugular vein, and this was documented with an image. Under real-time ultrasound guidance, this vein was accessed with a 21 gauge micropuncture needle and image documentation was performed. A small dermatotomy was made at the access site with an 11 scalpel. A 0.018" wire was advanced into  the SVC and the access needle exchanged for a 7F micropuncture vascular sheath. The 0.018" wire was then removed and a 0.035" wire advanced into the IVC. An appropriate location for the subcutaneous reservoir was selected below the clavicle and an incision was made  through the skin and underlying soft tissues. The subcutaneous tissues were then dissected using a combination of blunt and sharp surgical technique and a pocket was formed. A single lumen power injectable portacatheter was then tunneled through the subcutaneous tissues from the pocket to the dermatotomy and the port reservoir placed within the subcutaneous pocket. The venous access site was then serially dilated and a peel away vascular sheath placed over the wire. The wire was removed and the port catheter advanced into position under fluoroscopic guidance. The catheter tip is positioned in the superior cavoatrial junction. This was documented with a spot image. The portacatheter was then tested and found to flush and aspirate well. The port was flushed with saline followed by 100 units/mL heparinized saline. The pocket was then closed in two layers using first subdermal inverted interrupted absorbable sutures followed by a running subcuticular suture. The epidermis was then sealed with Dermabond. The dermatotomy at the venous access site was also closed with Dermabond. IMPRESSION: Successful placement of a right IJ approach Power Port with ultrasound and fluoroscopic guidance. The catheter is ready for use. Electronically Signed   By: Jacqulynn Cadet M.D.   On: 11/19/2019 16:07       IMPRESSION/PLAN: 1. Stage IV squamous cell carcinoma of unknown primary with bony metastatic disease and concern for impending cord compression. Dr. Lisbeth Renshaw discusses the pathology findings and reviews the nature of metastatic disease to the bones.  He has multiple sites of disease, the most concerning of course is at the thoracic T12 level with concerns for  compromise of the spinal canal. While he has other bony and visceral disease, he does not have any other symptoms of concern. Dr. Lisbeth Renshaw does discuss the findings in the left femur and recommends treatment of this site in order to reduce the risks of fracture. The left 5th rib lesion is also concerning and could become more symptomatic in the near future and he offers treatment to the left 5th rib as well. We discussed the risks, benefits, short, and long term effects of radiotherapy, and the patient is interested in proceeding with a course of palliative radiotherapy. Dr. Lisbeth Renshaw discusses the delivery and logistics of radiotherapy and anticipates a course of 3 weeks of radiotherapy to the lower aspect of the thoracic spine, left femur, and left 5th rib. Written consent is obtained and placed in the chart, a copy was provided to the patient. He will come in tomorrow for simulation at 2 pm and we anticipate starting treatment early next week. 2. Pain secondary to #1.The patient has a prescription for Hydrocodone and he is satisfied with this course at this time. He is aware we can change to a stronger regimen if needed and will keep [place] informed. 3. Impending cord compression.  While the patient appears to be  neurologically intact at this time, we did discuss the rationale for sending in a prescription for dexamethasone should his neurologic symptoms worsen acutely over the weekend or early in the course of treatment.  The patient is in agreement, we discussed the side effect profile of the medication and would give 4 mg 3 times a day.  He is aware of the need to prophylax with proton pump inhibitor to reduce the risk of GERD/PUD symptoms.  In a visit lasting 45 minutes, greater than 50% of the time was spent face to face and in discussing his case, and coordinating the patient's care along with medical oncology as well.   The above documentation reflects my  direct findings during this shared patient visit.  Please see the separate note by Dr. Lisbeth Renshaw on this date for the remainder of the patient's plan of care.    Carola Rhine, PAC

## 2019-12-06 NOTE — Progress Notes (Signed)
Met with patient at registration to introduce myself as Financial Resource Specialist and to offer available resources. ° °Discussed one-time $700 [facility] grant and qualifications to assist with personal expenses while going through treatment. ° °Gave him my card if interested in applying and for any additional financial questions or concerns. °

## 2019-12-07 ENCOUNTER — Inpatient Hospital Stay: Payer: Managed Care, Other (non HMO)

## 2019-12-07 ENCOUNTER — Other Ambulatory Visit: Payer: Self-pay

## 2019-12-07 ENCOUNTER — Ambulatory Visit
Admission: RE | Admit: 2019-12-07 | Discharge: 2019-12-07 | Disposition: A | Discharge status: Home / Self Care | Payer: Managed Care, Other (non HMO) | Source: Ambulatory Visit | Attending: Radiation Oncology | Admitting: Radiation Oncology

## 2019-12-07 ENCOUNTER — Telehealth: Payer: Self-pay | Admitting: Hematology and Oncology

## 2019-12-07 VITALS — BP 110/80 | HR 126 | Temp 98.2°F | Resp 20

## 2019-12-07 DIAGNOSIS — C7951 Secondary malignant neoplasm of bone: Secondary | ICD-10-CM

## 2019-12-07 DIAGNOSIS — K8689 Other specified diseases of pancreas: Secondary | ICD-10-CM

## 2019-12-07 DIAGNOSIS — Z510 Encounter for antineoplastic radiation therapy: Secondary | ICD-10-CM | POA: Diagnosis not present

## 2019-12-07 DIAGNOSIS — Z95828 Presence of other vascular implants and grafts: Secondary | ICD-10-CM

## 2019-12-07 MED ORDER — SODIUM CHLORIDE 0.9% FLUSH
10.0000 mL | INTRAVENOUS | Status: DC | PRN
  Administered 2019-12-07: 10 mL
  Filled 2019-12-07: qty 10

## 2019-12-07 MED ORDER — HEPARIN SOD (PORK) LOCK FLUSH 100 UNIT/ML IV SOLN
500.0000 [IU] | Freq: Once | INTRAVENOUS | Status: AC | PRN
  Administered 2019-12-07: 500 [IU]
  Filled 2019-12-07: qty 5

## 2019-12-07 MED ORDER — ALTEPLASE 2 MG IJ SOLR
2.0000 mg | Freq: Once | INTRAMUSCULAR | Status: DC | PRN
  Filled 2019-12-07: qty 2

## 2019-12-07 MED ORDER — ZOLEDRONIC ACID 4 MG/100ML IV SOLN
4.0000 mg | Freq: Once | INTRAVENOUS | Status: AC
  Administered 2019-12-07: 4 mg via INTRAVENOUS

## 2019-12-07 MED ORDER — ZOLEDRONIC ACID 4 MG/100ML IV SOLN
INTRAVENOUS | Status: AC
  Filled 2019-12-07: qty 100

## 2019-12-07 MED ORDER — SODIUM CHLORIDE 0.9 % IV SOLN
Freq: Once | INTRAVENOUS | Status: AC
  Filled 2019-12-07: qty 250

## 2019-12-07 NOTE — Patient Instructions (Signed)
Rehydration, Adult Rehydration is the replacement of body fluids and salts and minerals (electrolytes) that are lost during dehydration. Dehydration is when there is not enough fluid or water in the body. This happens when you lose more fluids than you take in. Common causes of dehydration include:  Vomiting.  Diarrhea.  Excessive sweating, such as from heat exposure or exercise.  Taking medicines that cause the body to lose excess fluid (diuretics).  Impaired kidney function.  Not drinking enough fluid.  Certain illnesses or infections.  Certain poorly controlled long-term (chronic) illnesses, such as diabetes, heart disease, and kidney disease.  Symptoms of mild dehydration may include thirst, dry lips and mouth, dry skin, and dizziness. Symptoms of severe dehydration may include increased heart rate, confusion, fainting, and not urinating. You can rehydrate by drinking certain fluids or getting fluids through an IV tube, as told by your health care provider. What are the risks? Generally, rehydration is safe. However, one problem that can happen is taking in too much fluid (overhydration). This is rare. If overhydration happens, it can cause an electrolyte imbalance, kidney failure, or a decrease in salt (sodium) levels in the body. How to rehydrate Follow instructions from your health care provider for rehydration. The kind of fluid you should drink and the amount you should drink depend on your condition.  If directed by your health care provider, drink an oral rehydration solution (ORS). This is a drink designed to treat dehydration that is found in pharmacies and retail stores. ? Make an ORS by following instructions on the package. ? Start by drinking small amounts, about  cup (120 mL) every 5-10 minutes. ? Slowly increase how much you drink until you have taken the amount recommended by your health care provider.  Drink enough clear fluids to keep your urine clear or pale  yellow. If you were instructed to drink an ORS, finish the ORS first, then start slowly drinking other clear fluids. Drink fluids such as: ? Water. Do not drink only water. Doing that can lead to having too little sodium in your body (hyponatremia). ? Ice chips. ? Fruit juice that you have added water to (diluted juice). ? Low-calorie sports drinks.  If you are severely dehydrated, your health care provider may recommend that you receive fluids through an IV tube in the hospital.  Do not take sodium tablets. Doing that can lead to the condition of having too much sodium in your body (hypernatremia). Eating while you rehydrate Follow instructions from your health care provider about what to eat while you rehydrate. Your health care provider may recommend that you slowly begin eating regular foods in small amounts.  Eat foods that contain a healthy balance of electrolytes, such as bananas, oranges, potatoes, tomatoes, and spinach.  Avoid foods that are greasy or contain a lot of fat or sugar.  In some cases, you may get nutrition through a feeding tube that is passed through your nose and into your stomach (nasogastric tube, or NG tube). This may be done if you have uncontrolled vomiting or diarrhea. Beverages to avoid Certain beverages may make dehydration worse. While you rehydrate, avoid:  Alcohol.  Caffeine.  Drinks that contain a lot of sugar. These include: ? High-calorie sports drinks. ? Fruit juice that is not diluted. ? Soda.  Check nutrition labels to see how much sugar or caffeine a beverage contains. Signs of dehydration recovery You may be recovering from dehydration if:  You are urinating more often than before you started   rehydrating.  Your urine is clear or pale yellow.  Your energy level improves.  You vomit less frequently.  You have diarrhea less frequently.  Your appetite improves or returns to normal.  You feel less dizzy or less light-headed.  Your  skin tone and color start to look more normal. Contact a health care provider if:  You continue to have symptoms of mild dehydration, such as: ? Thirst. ? Dry lips. ? Slightly dry mouth. ? Dry, warm skin. ? Dizziness.  You continue to vomit or have diarrhea. Get help right away if:  You have symptoms of dehydration that get worse.  You feel: ? Confused. ? Weak. ? Like you are going to faint.  You have not urinated in 6-8 hours.  You have very dark urine.  You have trouble breathing.  Your heart rate while sitting still is over 100 beats a minute.  You cannot drink fluids without vomiting.  You have vomiting or diarrhea that: ? Gets worse. ? Does not go away.  You have a fever. This information is not intended to replace advice given to you by your health care provider. Make sure you discuss any questions you have with your health care provider. Document Revised: 10/14/2017 Document Reviewed: 12/26/2015 Elsevier Patient Education  2020 Elsevier Inc.   Zoledronic Acid injection (Hypercalcemia, Oncology) What is this medicine? ZOLEDRONIC ACID (ZOE le dron ik AS id) lowers the amount of calcium loss from bone. It is used to treat too much calcium in your blood from cancer. It is also used to prevent complications of cancer that has spread to the bone. This medicine may be used for other purposes; ask your health care provider or pharmacist if you have questions. COMMON BRAND NAME(S): Zometa What should I tell my health care provider before I take this medicine? They need to know if you have any of these conditions:  aspirin-sensitive asthma  cancer, especially if you are receiving medicines used to treat cancer  dental disease or wear dentures  infection  kidney disease  receiving corticosteroids like dexamethasone or prednisone  an unusual or allergic reaction to zoledronic acid, other medicines, foods, dyes, or preservatives  pregnant or trying to get  pregnant  breast-feeding How should I use this medicine? This medicine is for infusion into a vein. It is given by a health care professional in a hospital or clinic setting. Talk to your pediatrician regarding the use of this medicine in children. Special care may be needed. Overdosage: If you think you have taken too much of this medicine contact a poison control center or emergency room at once. NOTE: This medicine is only for you. Do not share this medicine with others. What if I miss a dose? It is important not to miss your dose. Call your doctor or health care professional if you are unable to keep an appointment. What may interact with this medicine?  certain antibiotics given by injection  NSAIDs, medicines for pain and inflammation, like ibuprofen or naproxen  some diuretics like bumetanide, furosemide  teriparatide  thalidomide This list may not describe all possible interactions. Give your health care provider a list of all the medicines, herbs, non-prescription drugs, or dietary supplements you use. Also tell them if you smoke, drink alcohol, or use illegal drugs. Some items may interact with your medicine. What should I watch for while using this medicine? Visit your doctor or health care professional for regular checkups. It may be some time before you see the benefit   from this medicine. Do not stop taking your medicine unless your doctor tells you to. Your doctor may order blood tests or other tests to see how you are doing. Women should inform their doctor if they wish to become pregnant or think they might be pregnant. There is a potential for serious side effects to an unborn child. Talk to your health care professional or pharmacist for more information. You should make sure that you get enough calcium and vitamin D while you are taking this medicine. Discuss the foods you eat and the vitamins you take with your health care professional. Some people who take this medicine  have severe bone, joint, and/or muscle pain. This medicine may also increase your risk for jaw problems or a broken thigh bone. Tell your doctor right away if you have severe pain in your jaw, bones, joints, or muscles. Tell your doctor if you have any pain that does not go away or that gets worse. Tell your dentist and dental surgeon that you are taking this medicine. You should not have major dental surgery while on this medicine. See your dentist to have a dental exam and fix any dental problems before starting this medicine. Take good care of your teeth while on this medicine. Make sure you see your dentist for regular follow-up appointments. What side effects may I notice from receiving this medicine? Side effects that you should report to your doctor or health care professional as soon as possible:  allergic reactions like skin rash, itching or hives, swelling of the face, lips, or tongue  anxiety, confusion, or depression  breathing problems  changes in vision  eye pain  feeling faint or lightheaded, falls  jaw pain, especially after dental work  mouth sores  muscle cramps, stiffness, or weakness  redness, blistering, peeling or loosening of the skin, including inside the mouth  trouble passing urine or change in the amount of urine Side effects that usually do not require medical attention (report to your doctor or health care professional if they continue or are bothersome):  bone, joint, or muscle pain  constipation  diarrhea  fever  hair loss  irritation at site where injected  loss of appetite  nausea, vomiting  stomach upset  trouble sleeping  trouble swallowing  weak or tired This list may not describe all possible side effects. Call your doctor for medical advice about side effects. You may report side effects to FDA at 1-800-FDA-1088. Where should I keep my medicine? This drug is given in a hospital or clinic and will not be stored at home. NOTE:  This sheet is a summary. It may not cover all possible information. If you have questions about this medicine, talk to your doctor, pharmacist, or health care provider.  2020 Elsevier/Gold Standard (2014-03-30 14:19:39)   

## 2019-12-07 NOTE — Telephone Encounter (Signed)
Scheduled per los. Called and spoke with patient. Patient will get printout from scheduling after rad-onc appt

## 2019-12-10 ENCOUNTER — Ambulatory Visit: Payer: Managed Care, Other (non HMO) | Admitting: Radiation Oncology

## 2019-12-10 ENCOUNTER — Other Ambulatory Visit: Payer: Self-pay

## 2019-12-10 ENCOUNTER — Inpatient Hospital Stay: Payer: Managed Care, Other (non HMO)

## 2019-12-10 ENCOUNTER — Ambulatory Visit: Payer: Managed Care, Other (non HMO)

## 2019-12-10 ENCOUNTER — Ambulatory Visit
Admission: RE | Admit: 2019-12-10 | Discharge: 2019-12-10 | Disposition: A | Discharge status: Home / Self Care | Payer: Managed Care, Other (non HMO) | Source: Ambulatory Visit | Attending: Radiation Oncology | Admitting: Radiation Oncology

## 2019-12-10 ENCOUNTER — Encounter (HOSPITAL_COMMUNITY): Payer: Self-pay | Admitting: Emergency Medicine

## 2019-12-10 ENCOUNTER — Emergency Department (HOSPITAL_COMMUNITY)
Admission: EM | Admit: 2019-12-10 | Discharge: 2019-12-10 | Disposition: A | Discharge status: Home / Self Care | Payer: Managed Care, Other (non HMO) | Attending: Emergency Medicine | Admitting: Emergency Medicine

## 2019-12-10 DIAGNOSIS — Y929 Unspecified place or not applicable: Secondary | ICD-10-CM | POA: Diagnosis not present

## 2019-12-10 DIAGNOSIS — Z79899 Other long term (current) drug therapy: Secondary | ICD-10-CM | POA: Insufficient documentation

## 2019-12-10 DIAGNOSIS — C7951 Secondary malignant neoplasm of bone: Secondary | ICD-10-CM | POA: Insufficient documentation

## 2019-12-10 DIAGNOSIS — Y9301 Activity, walking, marching and hiking: Secondary | ICD-10-CM | POA: Insufficient documentation

## 2019-12-10 DIAGNOSIS — I10 Essential (primary) hypertension: Secondary | ICD-10-CM | POA: Insufficient documentation

## 2019-12-10 DIAGNOSIS — Z510 Encounter for antineoplastic radiation therapy: Secondary | ICD-10-CM | POA: Diagnosis not present

## 2019-12-10 DIAGNOSIS — Y999 Unspecified external cause status: Secondary | ICD-10-CM | POA: Insufficient documentation

## 2019-12-10 DIAGNOSIS — W19XXXA Unspecified fall, initial encounter: Secondary | ICD-10-CM

## 2019-12-10 DIAGNOSIS — W010XXA Fall on same level from slipping, tripping and stumbling without subsequent striking against object, initial encounter: Secondary | ICD-10-CM | POA: Insufficient documentation

## 2019-12-10 DIAGNOSIS — Z7982 Long term (current) use of aspirin: Secondary | ICD-10-CM | POA: Diagnosis not present

## 2019-12-10 DIAGNOSIS — M545 Low back pain: Secondary | ICD-10-CM | POA: Diagnosis not present

## 2019-12-10 LAB — COMPREHENSIVE METABOLIC PANEL
ALT: 46 U/L — ABNORMAL HIGH (ref 0–44)
AST: 84 U/L — ABNORMAL HIGH (ref 15–41)
Albumin: 1.8 g/dL — ABNORMAL LOW (ref 3.5–5.0)
Alkaline Phosphatase: 508 U/L — ABNORMAL HIGH (ref 38–126)
Anion gap: 10 (ref 5–15)
BUN: 13 mg/dL (ref 6–20)
CO2: 23 mmol/L (ref 22–32)
Calcium: 10.7 mg/dL — ABNORMAL HIGH (ref 8.9–10.3)
Chloride: 98 mmol/L (ref 98–111)
Creatinine, Ser: 0.67 mg/dL (ref 0.61–1.24)
GFR calc Af Amer: >60 mL/min (ref >60)
GFR calc non Af Amer: >60 mL/min (ref >60)
Glucose, Bld: 193 mg/dL — ABNORMAL HIGH (ref 70–99)
Potassium: 3.8 mmol/L (ref 3.5–5.1)
Sodium: 131 mmol/L — ABNORMAL LOW (ref 135–145)
Total Bilirubin: 3.1 mg/dL — ABNORMAL HIGH (ref 0.3–1.2)
Total Protein: 5.9 g/dL — ABNORMAL LOW (ref 6.5–8.1)

## 2019-12-10 MED ORDER — SODIUM CHLORIDE 0.9 % IV BOLUS
1000.0000 mL | Freq: Once | INTRAVENOUS | Status: AC
  Administered 2019-12-10: 1000 mL via INTRAVENOUS

## 2019-12-10 MED ORDER — HYDROCODONE-ACETAMINOPHEN 5-325 MG PO TABS
1.0000 | ORAL_TABLET | Freq: Once | ORAL | Status: AC
  Administered 2019-12-10: 1 via ORAL
  Filled 2019-12-10: qty 1

## 2019-12-10 NOTE — ED Triage Notes (Addendum)
Patient here from home in route to infusion appointment for calcium. Walking with walker to the car, reports walker was unsteady, tripped and fell backwards, injuring back. Hx of T12 fracture in Dec. Pain 10/10. Also states that he has been "tachycardic" for awhile.

## 2019-12-10 NOTE — ED Provider Notes (Signed)
I spoke with Dr. Lorenso Courier and we will get a c-Met and call the results back to him.  Pager number is [phone].   Milton Ferguson, MD 12/10/19 1143

## 2019-12-10 NOTE — ED Provider Notes (Signed)
[facility] DEPT Provider Note   CSN: [id number] Arrival date & time: 12/10/19  0930     History Chief Complaint  Patient presents with  . Cancer Patient  . Fall    Kenneth Slisz. is a 54 y.o. male.  Patient was going to his oncology appointment today to be rechecked and he slipped and fell with his walker and hit his lower back.  Patient has a history of chronic low back pain and states the discomfort is not any worse than normal for him  The history is provided by the patient. No language interpreter was used.  Fall This is a new problem. The current episode started 3 to 5 hours ago. The problem occurs rarely. The problem has been resolved. Pertinent negatives include no chest pain, no abdominal pain and no headaches. Nothing aggravates the symptoms. Nothing relieves the symptoms. He has tried nothing for the symptoms. The treatment provided no relief.       Past Medical History:  Diagnosis Date  . Cancer ([place])   . Hypertension     Patient Active Problem List   Diagnosis Date Noted  . Port-A-Cath in place 12/06/2019  . Pancreatic mass 11/04/2019  . 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 Hypercalcemia 11/03/2019    Past Surgical History:  Procedure Laterality Date  . IR IMAGING GUIDED PORT INSERTION  11/19/2019  . NO PAST SURGERIES         Family History  Problem Relation Age of Onset  . Non-Hodgkin's lymphoma Father     Social History   Tobacco Use  . Smoking status: Never Smoker  . Smokeless tobacco: Never Used  Substance Use Topics  . Alcohol use: Yes  . Drug use: Never    Home Medications Prior to Admission medications   Medication Sig Start Date End Date Taking? Authorizing Provider  aspirin EC 81 MG tablet Take 81 mg by mouth daily.   Yes [provider]  fenofibrate 54 MG tablet Take 54 mg by mouth daily.   Yes [provider]  ferrous sulfate  325 (65 FE) MG tablet Take 1 tablet (325 mg total) by mouth daily with breakfast. Take with orange juice. 11/30/19  Yes Orson Slick, MD  lidocaine-prilocaine (EMLA) cream Apply 1 application topically as needed. Patient taking differently: Apply 1 application topically as needed (port access).  12/06/19  Yes Orson Slick, MD  oxyCODONE (OXY IR/ROXICODONE) 5 MG immediate release tablet Take 1-2 tablets (5-10 mg total) by mouth every 6 (six) hours as needed for severe pain. 12/06/19  Yes Orson Slick, MD  pravastatin (PRAVACHOL) 40 MG tablet Take 40 mg by mouth daily.   Yes [provider]  dexamethasone (DECADRON) 4 MG tablet Take 1 tablet (4 mg total) by mouth 3 (three) times daily. 12/06/19   Hayden Pedro, PA-C  methocarbamol (ROBAXIN) 750 MG tablet Take 1 tablet (750 mg total) by mouth 3 (three) times daily. 12/06/19   Orson Slick, MD  ondansetron (ZOFRAN) 8 MG tablet Take 1 tablet (8 mg total) by mouth every 8 (eight) hours as needed for nausea or vomiting. 12/06/19   Orson Slick, MD  prochlorperazine (COMPAZINE) 10 MG tablet Take 1 tablet (10 mg total) by mouth every 6 (six) hours as needed for nausea or vomiting. 12/06/19   Orson Slick, MD    Allergies    Patient  has no known allergies.  Review of Systems   Review of Systems  Constitutional: Negative for appetite change and fatigue.  HENT: Negative for congestion, ear discharge and sinus pressure.   Eyes: Negative for discharge.  Respiratory: Negative for cough.   Cardiovascular: Negative for chest pain.  Gastrointestinal: Negative for abdominal pain and diarrhea.  Genitourinary: Negative for frequency and hematuria.  Musculoskeletal: Positive for back pain.  Skin: Negative for rash.  Neurological: Negative for seizures and headaches.  Psychiatric/Behavioral: Negative for hallucinations.    Physical Exam Updated Vital Signs BP 121/78   Pulse (!) 105   Temp 97.7 F (36.5 C) (Oral)    Resp (!) 29   SpO2 92%   Physical Exam Vitals and nursing note reviewed.  Constitutional:      Appearance: He is well-developed.  HENT:     Head: Normocephalic.  Eyes:     General: No scleral icterus.    Conjunctiva/sclera: Conjunctivae normal.  Neck:     Thyroid: No thyromegaly.  Cardiovascular:     Rate and Rhythm: Normal rate and regular rhythm.     Heart sounds: No murmur. No friction rub. No gallop.   Pulmonary:     Breath sounds: No stridor. No wheezing or rales.  Chest:     Chest wall: No tenderness.  Abdominal:     General: There is no distension.     Tenderness: There is no abdominal tenderness. There is no rebound.  Musculoskeletal:     Cervical back: Neck supple.     Comments: Minimal tenderness to lumbar spine  Lymphadenopathy:     Cervical: No cervical adenopathy.  Skin:    Findings: No erythema or rash.  Neurological:     Mental Status: He is alert and oriented to person, place, and time.     Motor: No abnormal muscle tone.     Coordination: Coordination normal.  Psychiatric:        Behavior: Behavior normal.     ED Results / Procedures / Treatments   Labs (all labs ordered are listed, but only abnormal results are displayed) Labs Reviewed  COMPREHENSIVE METABOLIC PANEL - Abnormal; Notable for the following components:      Result Value   Sodium 131 (*)    Glucose, Bld 193 (*)    Calcium 10.7 (*)    Total Protein 5.9 (*)    Albumin 1.8 (*)    AST 84 (*)    ALT 46 (*)    Alkaline Phosphatase 508 (*)    Total Bilirubin 3.1 (*)    All other components within normal limits    EKG None  Radiology No results found.  Procedures Procedures (including critical care time)  Medications Ordered in ED Medications  HYDROcodone-acetaminophen (NORCO/VICODIN) 5-325 MG per tablet 1 tablet (1 tablet Oral Given 12/10/19 1113)  sodium chloride 0.9 % bolus 1,000 mL (1,000 mLs Intravenous New Bag/Given (Non-Interop) 12/10/19 1215)    ED Course  I have  reviewed the triage vital signs and the nursing notes.  Pertinent labs & imaging results that were available during my care of the patient were reviewed by me and considered in my medical decision making (see chart for details).    MDM Rules/Calculators/A&P                     I spoke with the patient's oncologist and they wanted [place] to repeat the patient's calcium.  The patient's calcium was down to 10.7 from 11.7 recently.  Patient was given a liter of fluids.  He has mild contusion to lumbar spine with no additional treatment needed.  He will be discharged home but is going to his radiation oncology appointments afternoon and will follow up with oncology on February 1 Final Clinical Impression(s) / ED Diagnoses Final diagnoses:  None    Rx / DC Orders ED Discharge Orders    None       Milton Ferguson, MD 12/10/19 1327

## 2019-12-10 NOTE — Discharge Instructions (Addendum)
Drink plenty of fluids.  Go to your radiation oncology appointment today.  Your next appointment with the oncologist is February 1.

## 2019-12-11 ENCOUNTER — Ambulatory Visit: Payer: Managed Care, Other (non HMO)

## 2019-12-11 ENCOUNTER — Ambulatory Visit
Admission: RE | Admit: 2019-12-11 | Discharge: 2019-12-11 | Disposition: A | Discharge status: Home / Self Care | Payer: Managed Care, Other (non HMO) | Source: Ambulatory Visit | Attending: Radiation Oncology | Admitting: Radiation Oncology

## 2019-12-11 ENCOUNTER — Other Ambulatory Visit: Payer: Self-pay

## 2019-12-11 DIAGNOSIS — Z510 Encounter for antineoplastic radiation therapy: Secondary | ICD-10-CM | POA: Diagnosis not present

## 2019-12-12 ENCOUNTER — Ambulatory Visit
Admission: RE | Admit: 2019-12-12 | Discharge: 2019-12-12 | Disposition: A | Discharge status: Home / Self Care | Payer: Managed Care, Other (non HMO) | Source: Ambulatory Visit | Attending: Radiation Oncology | Admitting: Radiation Oncology

## 2019-12-12 ENCOUNTER — Telehealth: Payer: Self-pay | Admitting: General Practice

## 2019-12-12 ENCOUNTER — Telehealth: Payer: Self-pay | Admitting: *Deleted

## 2019-12-12 ENCOUNTER — Other Ambulatory Visit: Payer: Self-pay

## 2019-12-12 ENCOUNTER — Ambulatory Visit: Payer: Managed Care, Other (non HMO)

## 2019-12-12 DIAGNOSIS — C787 Secondary malignant neoplasm of liver and intrahepatic bile duct: Secondary | ICD-10-CM

## 2019-12-12 DIAGNOSIS — Z510 Encounter for antineoplastic radiation therapy: Secondary | ICD-10-CM | POA: Diagnosis not present

## 2019-12-12 DIAGNOSIS — C801 Malignant (primary) neoplasm, unspecified: Secondary | ICD-10-CM

## 2019-12-12 DIAGNOSIS — K8689 Other specified diseases of pancreas: Secondary | ICD-10-CM

## 2019-12-12 NOTE — Telephone Encounter (Signed)
Received call from patient's fiancee, Lorita Officer.  She states that Guerry is unable to ambulate at all on his own or even with 1 person assist.  She states that since his radiation started 3 days ago, he cannot walk and his pain has increased in his back. And he cannot put any pressure on his feet.  [name] states she had to resort to adults depends because he could not even stand long enough to pass urine.  [name] has not been able to attend any of his appts here and has many questions related to his cancer and treatment and overall condition. She would like to talk with Dr. Lorenso Courier with [facility].   Spoke with Dr. Lorenso Courier and he is agreeable to speaking with them both when pt comes for his radiation.  Spoke with patient when he arrived and he is fine with Dr. Lorenso Courier speaking with both him and [name].  He signed a Patient Information Release Form with gina designated as a person we can speak with about his condition.   Palliative Care referral made for symptom management to hospice of the [facility] for 1 x time consultation.  Home Health referral requested per Dr. Lorenso Courier. The process for that has been started.  RadOnc aware that pt will be a little late for his treatment due to talking with Dr. Lorenso Courier.

## 2019-12-12 NOTE — Telephone Encounter (Signed)
[facility] CSW Progress Notes  Request received from [place] to reach out to patient and girlfriend Lorita Officer re needs.  CSW also received call from friend Jobe Gibbon [phone]).  Was unable to reach patient, did speak with Lorita Officer.  "I have been completely overwhelmed."  HAs lost all the strength in his legs, cannot get out of the house.  Has no ramp.  Most difficult part is the "transporting."  Cannot get patient into or out of wheelchair.  Requires multiple people to lift.  Has 4 steps to get patient out of house.  No ramp.  House has no full bath on ground floor, stairs go up to second floor master and full bath.  Family has no DME at this time - has "been on the couch for the last five weeks."  Desk nurse is trying to arrange [facility] referral.  Are also trying to engage palliative care for additional support.    Edwyna Shell, LCSW Clinical Social Worker Phone:  [phone] Cell:  [phone]

## 2019-12-13 ENCOUNTER — Ambulatory Visit: Payer: Managed Care, Other (non HMO)

## 2019-12-13 ENCOUNTER — Telehealth: Payer: Self-pay | Admitting: General Practice

## 2019-12-13 ENCOUNTER — Encounter: Payer: Self-pay | Admitting: General Practice

## 2019-12-13 ENCOUNTER — Ambulatory Visit
Admission: RE | Admit: 2019-12-13 | Discharge: 2019-12-13 | Disposition: A | Discharge status: Home / Self Care | Payer: Managed Care, Other (non HMO) | Source: Ambulatory Visit | Attending: Radiation Oncology | Admitting: Radiation Oncology

## 2019-12-13 ENCOUNTER — Other Ambulatory Visit: Payer: Self-pay

## 2019-12-13 DIAGNOSIS — Z510 Encounter for antineoplastic radiation therapy: Secondary | ICD-10-CM | POA: Diagnosis not present

## 2019-12-13 NOTE — Progress Notes (Signed)
[facility] CSW Progress Notes  Consulted w TOC supervisor - suggested patient would benefit from assignment of care manager w [employer] plan as he has diverse medical needs.  Advised significant other, Lorita Officer, to help patient call and request this help.  Also called [employer] [place] [facility] - case management).  Requested assignment of case manager to assist patient/family.    Edwyna Shell, LCSW Clinical Social Worker Phone:  [phone] Cell:  [phone]

## 2019-12-13 NOTE — Telephone Encounter (Signed)
[facility] CSW Progress Notes  Provided contact information for family to contact [employer] re assignment of case manager [phone].  Edwyna Shell, LCSW Clinical Social Worker Phone:  [phone] Cell:  [phone]

## 2019-12-14 ENCOUNTER — Other Ambulatory Visit: Payer: Self-pay

## 2019-12-14 ENCOUNTER — Telehealth: Payer: Self-pay | Admitting: General Practice

## 2019-12-14 ENCOUNTER — Ambulatory Visit
Admission: RE | Admit: 2019-12-14 | Discharge: 2019-12-14 | Disposition: A | Discharge status: Home / Self Care | Payer: Managed Care, Other (non HMO) | Source: Ambulatory Visit | Attending: Radiation Oncology | Admitting: Radiation Oncology

## 2019-12-14 ENCOUNTER — Ambulatory Visit: Payer: Managed Care, Other (non HMO)

## 2019-12-14 DIAGNOSIS — Z510 Encounter for antineoplastic radiation therapy: Secondary | ICD-10-CM | POA: Diagnosis not present

## 2019-12-14 NOTE — Progress Notes (Signed)
Pt here for patient teaching.  Pt given Radiation and You booklet.  Reviewed areas of pertinence such as fatigue, hair loss, nausea and vomiting, skin changes, throat changes, cough and shortness of breath . Pt able to give teach back of to pat skin and use unscented/gentle soap,avoid applying anything to skin within 4 hours of treatment. Pt verbalizes understanding of information given and will contact nursing with any questions or concerns.    Gloriajean Dell. Leonie Green, BSN

## 2019-12-14 NOTE — Telephone Encounter (Signed)
[facility] CSW Progress Notes  Call to [employer], significant other.  Unable to leave VM.  Per Drucie Ip RN, she has been unable to locate [facility] agency willing to accept patient.  Message sent to [facility] leadership to ask for help in connecting patient w resources.  Also have called [employer] and requested they assign a case manager to patient.    Edwyna Shell, LCSW Clinical Social Worker Phone:  [phone] Cell:  [phone]

## 2019-12-17 ENCOUNTER — Other Ambulatory Visit: Payer: Self-pay | Admitting: Hematology and Oncology

## 2019-12-17 ENCOUNTER — Ambulatory Visit: Payer: Managed Care, Other (non HMO)

## 2019-12-17 ENCOUNTER — Inpatient Hospital Stay: Payer: Managed Care, Other (non HMO) | Attending: Hematology and Oncology

## 2019-12-17 ENCOUNTER — Inpatient Hospital Stay: Payer: Managed Care, Other (non HMO)

## 2019-12-17 ENCOUNTER — Ambulatory Visit
Admission: RE | Admit: 2019-12-17 | Discharge: 2019-12-17 | Disposition: A | Discharge status: Home / Self Care | Payer: Managed Care, Other (non HMO) | Source: Ambulatory Visit | Attending: Radiation Oncology | Admitting: Radiation Oncology

## 2019-12-17 ENCOUNTER — Other Ambulatory Visit: Payer: Self-pay

## 2019-12-17 VITALS — BP 129/89 | HR 95 | Temp 97.4°F | Resp 18

## 2019-12-17 DIAGNOSIS — K8689 Other specified diseases of pancreas: Secondary | ICD-10-CM

## 2019-12-17 DIAGNOSIS — C787 Secondary malignant neoplasm of liver and intrahepatic bile duct: Secondary | ICD-10-CM

## 2019-12-17 DIAGNOSIS — C7951 Secondary malignant neoplasm of bone: Secondary | ICD-10-CM

## 2019-12-17 DIAGNOSIS — C801 Malignant (primary) neoplasm, unspecified: Secondary | ICD-10-CM

## 2019-12-17 DIAGNOSIS — Z95828 Presence of other vascular implants and grafts: Secondary | ICD-10-CM

## 2019-12-17 DIAGNOSIS — Z510 Encounter for antineoplastic radiation therapy: Secondary | ICD-10-CM | POA: Insufficient documentation

## 2019-12-17 DIAGNOSIS — C159 Malignant neoplasm of esophagus, unspecified: Secondary | ICD-10-CM

## 2019-12-17 DIAGNOSIS — Z23 Encounter for immunization: Secondary | ICD-10-CM | POA: Insufficient documentation

## 2019-12-17 LAB — CMP (CANCER CENTER ONLY)
ALT: 68 U/L — ABNORMAL HIGH (ref 0–44)
AST: 76 U/L — ABNORMAL HIGH (ref 15–41)
Albumin: 2 g/dL — ABNORMAL LOW (ref 3.5–5.0)
Alkaline Phosphatase: 569 U/L — ABNORMAL HIGH (ref 38–126)
Anion gap: 11 (ref 5–15)
BUN: 26 mg/dL — ABNORMAL HIGH (ref 6–20)
CO2: 23 mmol/L (ref 22–32)
Calcium: 9.8 mg/dL (ref 8.9–10.3)
Chloride: 104 mmol/L (ref 98–111)
Creatinine: 0.78 mg/dL (ref 0.61–1.24)
GFR, Est AFR Am: >60 mL/min (ref >60)
GFR, Estimated: >60 mL/min (ref >60)
Glucose, Bld: 220 mg/dL — ABNORMAL HIGH (ref 70–99)
Potassium: 4.1 mmol/L (ref 3.5–5.1)
Sodium: 138 mmol/L (ref 135–145)
Total Bilirubin: 3 mg/dL — ABNORMAL HIGH (ref 0.3–1.2)
Total Protein: 5.8 g/dL — ABNORMAL LOW (ref 6.5–8.1)

## 2019-12-17 LAB — CBC WITH DIFFERENTIAL (CANCER CENTER ONLY)
Abs Immature Granulocytes: 0 10*3/uL (ref 0.00–0.07)
Basophils Absolute: 0 10*3/uL (ref 0.0–0.1)
Basophils Relative: 0 %
Eosinophils Absolute: 0 10*3/uL (ref 0.0–0.5)
Eosinophils Relative: 0 %
HCT: 42.2 % (ref 39.0–52.0)
Hemoglobin: 12.3 g/dL — ABNORMAL LOW (ref 13.0–17.0)
Lymphocytes Relative: 1 %
Lymphs Abs: 0.5 10*3/uL — ABNORMAL LOW (ref 0.7–4.0)
MCH: 22.3 pg — ABNORMAL LOW (ref 26.0–34.0)
MCHC: 29.1 g/dL — ABNORMAL LOW (ref 30.0–36.0)
MCV: 76.4 fL — ABNORMAL LOW (ref 80.0–100.0)
Monocytes Absolute: 2.3 10*3/uL — ABNORMAL HIGH (ref 0.1–1.0)
Monocytes Relative: 5 %
Neutro Abs: 42.3 10*3/uL — ABNORMAL HIGH (ref 1.7–7.7)
Neutrophils Relative %: 94 %
Platelet Count: 413 10*3/uL — ABNORMAL HIGH (ref 150–400)
RBC: 5.52 MIL/uL (ref 4.22–5.81)
RDW: 23.2 % — ABNORMAL HIGH (ref 11.5–15.5)
WBC Count: 45 10*3/uL — ABNORMAL HIGH (ref 4.0–10.5)
nRBC: 0 % (ref 0.0–0.2)

## 2019-12-17 LAB — MAGNESIUM: Magnesium: 2.8 mg/dL — ABNORMAL HIGH (ref 1.7–2.4)

## 2019-12-17 MED ORDER — DIPHENHYDRAMINE HCL 50 MG/ML IJ SOLN
50.0000 mg | Freq: Once | INTRAMUSCULAR | Status: AC
  Administered 2019-12-17: 50 mg via INTRAVENOUS

## 2019-12-17 MED ORDER — OXYCODONE HCL 5 MG PO TABS: 5.0000 mg | ORAL_TABLET | Freq: Four times a day (QID) | ORAL | 0 refills | Status: AC | PRN

## 2019-12-17 MED ORDER — SODIUM CHLORIDE 0.9 % IV SOLN
135.0000 mg/m2 | Freq: Once | INTRAVENOUS | Status: AC
  Administered 2019-12-17: 270 mg via INTRAVENOUS
  Filled 2019-12-17: qty 45

## 2019-12-17 MED ORDER — PALONOSETRON HCL INJECTION 0.25 MG/5ML
INTRAVENOUS | Status: AC
  Filled 2019-12-17: qty 5

## 2019-12-17 MED ORDER — SODIUM CHLORIDE 0.9% FLUSH
10.0000 mL | INTRAVENOUS | Status: DC | PRN
  Administered 2019-12-17: 19:00:00 10 mL
  Filled 2019-12-17: qty 10

## 2019-12-17 MED ORDER — SODIUM CHLORIDE 0.9 % IV SOLN
Freq: Once | INTRAVENOUS | Status: AC
  Filled 2019-12-17: qty 250

## 2019-12-17 MED ORDER — SODIUM CHLORIDE 0.9 % IV SOLN
731.0000 mg | Freq: Once | INTRAVENOUS | Status: AC
  Administered 2019-12-17: 730 mg via INTRAVENOUS
  Filled 2019-12-17: qty 73

## 2019-12-17 MED ORDER — FAMOTIDINE IN NACL 20-0.9 MG/50ML-% IV SOLN
20.0000 mg | Freq: Once | INTRAVENOUS | Status: AC
  Administered 2019-12-17: 13:00:00 20 mg via INTRAVENOUS

## 2019-12-17 MED ORDER — DEXAMETHASONE SODIUM PHOSPHATE 10 MG/ML IJ SOLN
INTRAMUSCULAR | Status: AC
  Filled 2019-12-17: qty 1

## 2019-12-17 MED ORDER — SODIUM CHLORIDE 0.9 % IV SOLN: 10.0000 mg | Freq: Once | INTRAVENOUS | Status: DC

## 2019-12-17 MED ORDER — PALONOSETRON HCL INJECTION 0.25 MG/5ML
0.2500 mg | Freq: Once | INTRAVENOUS | Status: AC
  Administered 2019-12-17: 0.25 mg via INTRAVENOUS

## 2019-12-17 MED ORDER — FAMOTIDINE IN NACL 20-0.9 MG/50ML-% IV SOLN
INTRAVENOUS | Status: AC
  Filled 2019-12-17: qty 50

## 2019-12-17 MED ORDER — HEPARIN SOD (PORK) LOCK FLUSH 100 UNIT/ML IV SOLN
500.0000 [IU] | Freq: Once | INTRAVENOUS | Status: AC | PRN
  Administered 2019-12-17: 19:00:00 500 [IU]
  Filled 2019-12-17: qty 5

## 2019-12-17 MED ORDER — DEXAMETHASONE SODIUM PHOSPHATE 10 MG/ML IJ SOLN
10.0000 mg | Freq: Once | INTRAMUSCULAR | Status: AC
  Administered 2019-12-17: 13:00:00 10 mg via INTRAVENOUS

## 2019-12-17 MED ORDER — SODIUM CHLORIDE 0.9% FLUSH
10.0000 mL | INTRAVENOUS | Status: DC | PRN
  Administered 2019-12-17: 11:00:00 10 mL
  Filled 2019-12-17: qty 10

## 2019-12-17 MED ORDER — DIPHENHYDRAMINE HCL 50 MG/ML IJ SOLN
INTRAMUSCULAR | Status: AC
  Filled 2019-12-17: qty 1

## 2019-12-17 MED ORDER — SODIUM CHLORIDE 0.9 % IV SOLN
150.0000 mg | Freq: Once | INTRAVENOUS | Status: AC
  Administered 2019-12-17: 150 mg via INTRAVENOUS
  Filled 2019-12-17: qty 150

## 2019-12-17 NOTE — Progress Notes (Signed)
Per Dr. Lorenso Courier, Patient is OK to treat with Bili of 3 and HR of 111

## 2019-12-17 NOTE — Patient Instructions (Signed)
[facility] Discharge Instructions for Patients Receiving Chemotherapy  Today you received the following chemotherapy agents Paclitaxel; carboplatin  To help prevent nausea and vomiting after your treatment, we encourage you to take your nausea medication as directed   If you develop nausea and vomiting that is not controlled by your nausea medication, call the clinic.   BELOW ARE SYMPTOMS THAT SHOULD BE REPORTED IMMEDIATELY:  *FEVER GREATER THAN 100.5   *CHILLS WITH OR WITHOUT FEVER  NAUSEA AND VOMITING THAT IS NOT CONTROLLED WITH YOUR NAUSEA MEDICATION  *UNUSUAL SHORTNESS OF BREATH  *UNUSUAL BRUISING OR BLEEDING  TENDERNESS IN MOUTH AND THROAT WITH OR WITHOUT PRESENCE OF ULCERS  *URINARY PROBLEMS  *BOWEL PROBLEMS  UNUSUAL RASH Items with * indicate a potential emergency and should be followed up as soon as possible.  Feel free to call the clinic should you have any questions or concerns. The clinic phone number is [phone].  Please show the [facility] at check-in to the Emergency Department and triage nurse.  Paclitaxel injection What is this medicine? PACLITAXEL (PAK li TAX el) is a chemotherapy drug. It targets fast dividing cells, like cancer cells, and causes these cells to die. This medicine is used to treat ovarian cancer, breast cancer, lung cancer, Kaposi's sarcoma, and other cancers. This medicine may be used for other purposes; ask your health care provider or pharmacist if you have questions. COMMON BRAND NAME(S): Onxol, Taxol What should I tell my health care provider before I take this medicine? They need to know if you have any of these conditions:  history of irregular heartbeat  liver disease  low blood counts, like low white cell, platelet, or red cell counts  lung or breathing disease, like asthma  tingling of the fingers or toes, or other nerve disorder  an unusual or allergic reaction to paclitaxel, alcohol,  polyoxyethylated castor oil, other chemotherapy, other medicines, foods, dyes, or preservatives  pregnant or trying to get pregnant  breast-feeding How should I use this medicine? This drug is given as an infusion into a vein. It is administered in a hospital or clinic by a specially trained health care professional. Talk to your pediatrician regarding the use of this medicine in children. Special care may be needed. Overdosage: If you think you have taken too much of this medicine contact a poison control center or emergency room at once. NOTE: This medicine is only for you. Do not share this medicine with others. What if I miss a dose? It is important not to miss your dose. Call your doctor or health care professional if you are unable to keep an appointment. What may interact with this medicine? Do not take this medicine with any of the following medications:  disulfiram  metronidazole This medicine may also interact with the following medications:  antiviral medicines for hepatitis, HIV or AIDS  certain antibiotics like erythromycin and clarithromycin  certain medicines for fungal infections like ketoconazole and itraconazole  certain medicines for seizures like carbamazepine, phenobarbital, phenytoin  gemfibrozil  nefazodone  rifampin  St. John's wort This list may not describe all possible interactions. Give your health care provider a list of all the medicines, herbs, non-prescription drugs, or dietary supplements you use. Also tell them if you smoke, drink alcohol, or use illegal drugs. Some items may interact with your medicine. What should I watch for while using this medicine? Your condition will be monitored carefully while you are receiving this medicine. You will need important blood  work done while you are taking this medicine. This medicine can cause serious allergic reactions. To reduce your risk you will need to take other medicine(s) before treatment with this  medicine. If you experience allergic reactions like skin rash, itching or hives, swelling of the face, lips, or tongue, tell your doctor or health care professional right away. In some cases, you may be given additional medicines to help with side effects. Follow all directions for their use. This drug may make you feel generally unwell. This is not uncommon, as chemotherapy can affect healthy cells as well as cancer cells. Report any side effects. Continue your course of treatment even though you feel ill unless your doctor tells you to stop. Call your doctor or health care professional for advice if you get a fever, chills or sore throat, or other symptoms of a cold or flu. Do not treat yourself. This drug decreases your body's ability to fight infections. Try to avoid being around people who are sick. This medicine may increase your risk to bruise or bleed. Call your doctor or health care professional if you notice any unusual bleeding. Be careful brushing and flossing your teeth or using a toothpick because you may get an infection or bleed more easily. If you have any dental work done, tell your dentist you are receiving this medicine. Avoid taking products that contain aspirin, acetaminophen, ibuprofen, naproxen, or ketoprofen unless instructed by your doctor. These medicines may hide a fever. Do not become pregnant while taking this medicine. Women should inform their doctor if they wish to become pregnant or think they might be pregnant. There is a potential for serious side effects to an unborn child. Talk to your health care professional or pharmacist for more information. Do not breast-feed an infant while taking this medicine. Men are advised not to father a child while receiving this medicine. This product may contain alcohol. Ask your pharmacist or healthcare provider if this medicine contains alcohol. Be sure to tell all healthcare providers you are taking this medicine. Certain medicines,  like metronidazole and disulfiram, can cause an unpleasant reaction when taken with alcohol. The reaction includes flushing, headache, nausea, vomiting, sweating, and increased thirst. The reaction can last from 30 minutes to several hours. What side effects may I notice from receiving this medicine? Side effects that you should report to your doctor or health care professional as soon as possible:  allergic reactions like skin rash, itching or hives, swelling of the face, lips, or tongue  breathing problems  changes in vision  fast, irregular heartbeat  high or low blood pressure  mouth sores  pain, tingling, numbness in the hands or feet  signs of decreased platelets or bleeding - bruising, pinpoint red spots on the skin, black, tarry stools, blood in the urine  signs of decreased red blood cells - unusually weak or tired, feeling faint or lightheaded, falls  signs of infection - fever or chills, cough, sore throat, pain or difficulty passing urine  signs and symptoms of liver injury like dark yellow or brown urine; general ill feeling or flu-like symptoms; light-colored stools; loss of appetite; nausea; right upper belly pain; unusually weak or tired; yellowing of the eyes or skin  swelling of the ankles, feet, hands  unusually slow heartbeat Side effects that usually do not require medical attention (report to your doctor or health care professional if they continue or are bothersome):  diarrhea  hair loss  loss of appetite  muscle or joint pain  nausea, vomiting  pain, redness, or irritation at site where injected  tiredness This list may not describe all possible side effects. Call your doctor for medical advice about side effects. You may report side effects to FDA at 1-800-FDA-1088. Where should I keep my medicine? This drug is given in a hospital or clinic and will not be stored at home. NOTE: This sheet is a summary. It may not cover all possible information.  If you have questions about this medicine, talk to your doctor, pharmacist, or health care provider.  2020 Elsevier/Gold Standard (2017-07-05 13:14:55)   Carboplatin injection What is this medicine? CARBOPLATIN (KAR boe pla tin) is a chemotherapy drug. It targets fast dividing cells, like cancer cells, and causes these cells to die. This medicine is used to treat ovarian cancer and many other cancers. This medicine may be used for other purposes; ask your health care provider or pharmacist if you have questions. COMMON BRAND NAME(S): Paraplatin What should I tell my health care provider before I take this medicine? They need to know if you have any of these conditions:  blood disorders  hearing problems  kidney disease  recent or ongoing radiation therapy  an unusual or allergic reaction to carboplatin, cisplatin, other chemotherapy, other medicines, foods, dyes, or preservatives  pregnant or trying to get pregnant  breast-feeding How should I use this medicine? This drug is usually given as an infusion into a vein. It is administered in a hospital or clinic by a specially trained health care professional. Talk to your pediatrician regarding the use of this medicine in children. Special care may be needed. Overdosage: If you think you have taken too much of this medicine contact a poison control center or emergency room at once. NOTE: This medicine is only for you. Do not share this medicine with others. What if I miss a dose? It is important not to miss a dose. Call your doctor or health care professional if you are unable to keep an appointment. What may interact with this medicine?  medicines for seizures  medicines to increase blood counts like filgrastim, pegfilgrastim, sargramostim  some antibiotics like amikacin, gentamicin, neomycin, streptomycin, tobramycin  vaccines Talk to your doctor or health care professional before taking any of these  medicines:  acetaminophen  aspirin  ibuprofen  ketoprofen  naproxen This list may not describe all possible interactions. Give your health care provider a list of all the medicines, herbs, non-prescription drugs, or dietary supplements you use. Also tell them if you smoke, drink alcohol, or use illegal drugs. Some items may interact with your medicine. What should I watch for while using this medicine? Your condition will be monitored carefully while you are receiving this medicine. You will need important blood work done while you are taking this medicine. This drug may make you feel generally unwell. This is not uncommon, as chemotherapy can affect healthy cells as well as cancer cells. Report any side effects. Continue your course of treatment even though you feel ill unless your doctor tells you to stop. In some cases, you may be given additional medicines to help with side effects. Follow all directions for their use. Call your doctor or health care professional for advice if you get a fever, chills or sore throat, or other symptoms of a cold or flu. Do not treat yourself. This drug decreases your body's ability to fight infections. Try to avoid being around people who are sick. This medicine may increase your risk to bruise or  bleed. Call your doctor or health care professional if you notice any unusual bleeding. Be careful brushing and flossing your teeth or using a toothpick because you may get an infection or bleed more easily. If you have any dental work done, tell your dentist you are receiving this medicine. Avoid taking products that contain aspirin, acetaminophen, ibuprofen, naproxen, or ketoprofen unless instructed by your doctor. These medicines may hide a fever. Do not become pregnant while taking this medicine. Women should inform their doctor if they wish to become pregnant or think they might be pregnant. There is a potential for serious side effects to an unborn child. Talk  to your health care professional or pharmacist for more information. Do not breast-feed an infant while taking this medicine. What side effects may I notice from receiving this medicine? Side effects that you should report to your doctor or health care professional as soon as possible:  allergic reactions like skin rash, itching or hives, swelling of the face, lips, or tongue  signs of infection - fever or chills, cough, sore throat, pain or difficulty passing urine  signs of decreased platelets or bleeding - bruising, pinpoint red spots on the skin, black, tarry stools, nosebleeds  signs of decreased red blood cells - unusually weak or tired, fainting spells, lightheadedness  breathing problems  changes in hearing  changes in vision  chest pain  high blood pressure  low blood counts - This drug may decrease the number of white blood cells, red blood cells and platelets. You may be at increased risk for infections and bleeding.  nausea and vomiting  pain, swelling, redness or irritation at the injection site  pain, tingling, numbness in the hands or feet  problems with balance, talking, walking  trouble passing urine or change in the amount of urine Side effects that usually do not require medical attention (report to your doctor or health care professional if they continue or are bothersome):  hair loss  loss of appetite  metallic taste in the mouth or changes in taste This list may not describe all possible side effects. Call your doctor for medical advice about side effects. You may report side effects to FDA at 1-800-FDA-1088. Where should I keep my medicine? This drug is given in a hospital or clinic and will not be stored at home. NOTE: This sheet is a summary. It may not cover all possible information. If you have questions about this medicine, talk to your doctor, pharmacist, or health care provider.  2020 Elsevier/Gold Standard (2008-02-06 14:38:05)

## 2019-12-17 NOTE — Patient Instructions (Signed)

## 2019-12-18 ENCOUNTER — Ambulatory Visit
Admission: RE | Admit: 2019-12-18 | Discharge: 2019-12-18 | Disposition: A | Discharge status: Home / Self Care | Payer: Managed Care, Other (non HMO) | Source: Ambulatory Visit | Attending: Radiation Oncology | Admitting: Radiation Oncology

## 2019-12-18 ENCOUNTER — Ambulatory Visit: Payer: Managed Care, Other (non HMO)

## 2019-12-18 ENCOUNTER — Encounter: Payer: Self-pay | Admitting: *Deleted

## 2019-12-18 ENCOUNTER — Telehealth: Payer: Self-pay | Admitting: *Deleted

## 2019-12-18 NOTE — Progress Notes (Signed)
[facility] Clinical Social Work  [employer] representative contacted patient's significant other, Lorita Officer to offer support and follow up on request for [employer] and home health.  CSW was unable to leave a message as mailbox was full.   Johnnye Lana, MSW, LCSW, OSW-C Clinical Social Worker [facility] [phone]

## 2019-12-18 NOTE — Telephone Encounter (Signed)
Called pt to see how he did with his chemotherapy treatment yest. He reports doing well & denies any problems/concerns.  Pt is here getting ready to come in office for radiation treatment.  He knows to call if any problems/concerns.

## 2019-12-18 NOTE — Telephone Encounter (Signed)
-----   Message from Priscille Loveless, RN sent at 12/17/2019  3:48 PM EST ----- Regarding: First chemo follow up [place] taxol/Carbo

## 2019-12-19 ENCOUNTER — Other Ambulatory Visit: Payer: Self-pay

## 2019-12-19 ENCOUNTER — Inpatient Hospital Stay (HOSPITAL_COMMUNITY)
Admission: AD | Admit: 2019-12-19 | Discharge: 2020-01-14 | DRG: 435 | Disposition: E | Payer: Managed Care, Other (non HMO) | Source: Ambulatory Visit | Attending: Internal Medicine | Admitting: Internal Medicine

## 2019-12-19 ENCOUNTER — Ambulatory Visit
Admission: RE | Admit: 2019-12-19 | Discharge: 2019-12-19 | Disposition: A | Discharge status: Home / Self Care | Payer: Managed Care, Other (non HMO) | Source: Ambulatory Visit | Attending: Radiation Oncology | Admitting: Radiation Oncology

## 2019-12-19 ENCOUNTER — Inpatient Hospital Stay: Payer: Managed Care, Other (non HMO)

## 2019-12-19 ENCOUNTER — Encounter (HOSPITAL_COMMUNITY): Payer: Self-pay | Admitting: Family Medicine

## 2019-12-19 ENCOUNTER — Ambulatory Visit (HOSPITAL_BASED_OUTPATIENT_CLINIC_OR_DEPARTMENT_OTHER): Payer: Managed Care, Other (non HMO) | Admitting: Medical

## 2019-12-19 ENCOUNTER — Telehealth: Payer: Self-pay | Admitting: *Deleted

## 2019-12-19 ENCOUNTER — Encounter: Payer: Self-pay | Admitting: General Practice

## 2019-12-19 ENCOUNTER — Ambulatory Visit: Payer: Managed Care, Other (non HMO)

## 2019-12-19 DIAGNOSIS — E8809 Other disorders of plasma-protein metabolism, not elsewhere classified: Secondary | ICD-10-CM | POA: Diagnosis not present

## 2019-12-19 DIAGNOSIS — R140 Abdominal distension (gaseous): Secondary | ICD-10-CM

## 2019-12-19 DIAGNOSIS — B954 Other streptococcus as the cause of diseases classified elsewhere: Secondary | ICD-10-CM | POA: Diagnosis not present

## 2019-12-19 DIAGNOSIS — R233 Spontaneous ecchymoses: Secondary | ICD-10-CM | POA: Diagnosis not present

## 2019-12-19 DIAGNOSIS — K5900 Constipation, unspecified: Secondary | ICD-10-CM | POA: Diagnosis not present

## 2019-12-19 DIAGNOSIS — Z95828 Presence of other vascular implants and grafts: Secondary | ICD-10-CM

## 2019-12-19 DIAGNOSIS — Z960 Presence of urogenital implants: Secondary | ICD-10-CM | POA: Diagnosis not present

## 2019-12-19 DIAGNOSIS — R262 Difficulty in walking, not elsewhere classified: Secondary | ICD-10-CM | POA: Diagnosis not present

## 2019-12-19 DIAGNOSIS — R10819 Abdominal tenderness, unspecified site: Secondary | ICD-10-CM | POA: Diagnosis not present

## 2019-12-19 DIAGNOSIS — R188 Other ascites: Secondary | ICD-10-CM | POA: Diagnosis not present

## 2019-12-19 DIAGNOSIS — C801 Malignant (primary) neoplasm, unspecified: Secondary | ICD-10-CM | POA: Diagnosis present

## 2019-12-19 DIAGNOSIS — D509 Iron deficiency anemia, unspecified: Secondary | ICD-10-CM | POA: Diagnosis not present

## 2019-12-19 DIAGNOSIS — K30 Functional dyspepsia: Secondary | ICD-10-CM | POA: Diagnosis not present

## 2019-12-19 DIAGNOSIS — R7881 Bacteremia: Secondary | ICD-10-CM | POA: Diagnosis not present

## 2019-12-19 DIAGNOSIS — Z515 Encounter for palliative care: Secondary | ICD-10-CM | POA: Diagnosis present

## 2019-12-19 DIAGNOSIS — Z20822 Contact with and (suspected) exposure to covid-19: Secondary | ICD-10-CM | POA: Diagnosis present

## 2019-12-19 DIAGNOSIS — J9690 Respiratory failure, unspecified, unspecified whether with hypoxia or hypercapnia: Secondary | ICD-10-CM | POA: Diagnosis not present

## 2019-12-19 DIAGNOSIS — D6959 Other secondary thrombocytopenia: Secondary | ICD-10-CM | POA: Diagnosis present

## 2019-12-19 DIAGNOSIS — R748 Abnormal levels of other serum enzymes: Secondary | ICD-10-CM | POA: Diagnosis present

## 2019-12-19 DIAGNOSIS — C787 Secondary malignant neoplasm of liver and intrahepatic bile duct: Secondary | ICD-10-CM | POA: Diagnosis present

## 2019-12-19 DIAGNOSIS — T380X5A Adverse effect of glucocorticoids and synthetic analogues, initial encounter: Secondary | ICD-10-CM | POA: Diagnosis present

## 2019-12-19 DIAGNOSIS — R799 Abnormal finding of blood chemistry, unspecified: Secondary | ICD-10-CM | POA: Diagnosis not present

## 2019-12-19 DIAGNOSIS — D696 Thrombocytopenia, unspecified: Secondary | ICD-10-CM | POA: Diagnosis not present

## 2019-12-19 DIAGNOSIS — L89621 Pressure ulcer of left heel, stage 1: Secondary | ICD-10-CM | POA: Diagnosis present

## 2019-12-19 DIAGNOSIS — J90 Pleural effusion, not elsewhere classified: Secondary | ICD-10-CM | POA: Diagnosis present

## 2019-12-19 DIAGNOSIS — C7951 Secondary malignant neoplasm of bone: Secondary | ICD-10-CM | POA: Diagnosis present

## 2019-12-19 DIAGNOSIS — Z7982 Long term (current) use of aspirin: Secondary | ICD-10-CM

## 2019-12-19 DIAGNOSIS — B9689 Other specified bacterial agents as the cause of diseases classified elsewhere: Secondary | ICD-10-CM | POA: Diagnosis not present

## 2019-12-19 DIAGNOSIS — R531 Weakness: Secondary | ICD-10-CM

## 2019-12-19 DIAGNOSIS — D500 Iron deficiency anemia secondary to blood loss (chronic): Secondary | ICD-10-CM | POA: Diagnosis present

## 2019-12-19 DIAGNOSIS — Z7189 Other specified counseling: Secondary | ICD-10-CM

## 2019-12-19 DIAGNOSIS — Z9889 Other specified postprocedural states: Secondary | ICD-10-CM | POA: Diagnosis not present

## 2019-12-19 DIAGNOSIS — L89312 Pressure ulcer of right buttock, stage 2: Secondary | ICD-10-CM | POA: Diagnosis present

## 2019-12-19 DIAGNOSIS — K8689 Other specified diseases of pancreas: Secondary | ICD-10-CM

## 2019-12-19 DIAGNOSIS — D61818 Other pancytopenia: Secondary | ICD-10-CM | POA: Diagnosis not present

## 2019-12-19 DIAGNOSIS — A408 Other streptococcal sepsis: Secondary | ICD-10-CM | POA: Diagnosis not present

## 2019-12-19 DIAGNOSIS — K567 Ileus, unspecified: Secondary | ICD-10-CM | POA: Diagnosis not present

## 2019-12-19 DIAGNOSIS — R197 Diarrhea, unspecified: Secondary | ICD-10-CM | POA: Diagnosis not present

## 2019-12-19 DIAGNOSIS — R109 Unspecified abdominal pain: Secondary | ICD-10-CM | POA: Diagnosis not present

## 2019-12-19 DIAGNOSIS — R12 Heartburn: Secondary | ICD-10-CM | POA: Diagnosis not present

## 2019-12-19 DIAGNOSIS — D61810 Antineoplastic chemotherapy induced pancytopenia: Secondary | ICD-10-CM | POA: Diagnosis present

## 2019-12-19 DIAGNOSIS — M549 Dorsalgia, unspecified: Secondary | ICD-10-CM | POA: Diagnosis present

## 2019-12-19 DIAGNOSIS — E861 Hypovolemia: Secondary | ICD-10-CM | POA: Diagnosis present

## 2019-12-19 DIAGNOSIS — B955 Unspecified streptococcus as the cause of diseases classified elsewhere: Secondary | ICD-10-CM | POA: Diagnosis not present

## 2019-12-19 DIAGNOSIS — R000 Tachycardia, unspecified: Secondary | ICD-10-CM | POA: Diagnosis not present

## 2019-12-19 DIAGNOSIS — A419 Sepsis, unspecified organism: Secondary | ICD-10-CM | POA: Diagnosis not present

## 2019-12-19 DIAGNOSIS — R58 Hemorrhage, not elsewhere classified: Secondary | ICD-10-CM | POA: Diagnosis not present

## 2019-12-19 DIAGNOSIS — R0603 Acute respiratory distress: Secondary | ICD-10-CM | POA: Diagnosis not present

## 2019-12-19 DIAGNOSIS — D701 Agranulocytosis secondary to cancer chemotherapy: Secondary | ICD-10-CM | POA: Diagnosis present

## 2019-12-19 DIAGNOSIS — M8448XD Pathological fracture, other site, subsequent encounter for fracture with routine healing: Secondary | ICD-10-CM | POA: Diagnosis not present

## 2019-12-19 DIAGNOSIS — T451X5A Adverse effect of antineoplastic and immunosuppressive drugs, initial encounter: Secondary | ICD-10-CM | POA: Diagnosis present

## 2019-12-19 DIAGNOSIS — Z66 Do not resuscitate: Secondary | ICD-10-CM | POA: Diagnosis not present

## 2019-12-19 DIAGNOSIS — R269 Unspecified abnormalities of gait and mobility: Secondary | ICD-10-CM | POA: Diagnosis present

## 2019-12-19 DIAGNOSIS — Y842 Radiological procedure and radiotherapy as the cause of abnormal reaction of the patient, or of later complication, without mention of misadventure at the time of the procedure: Secondary | ICD-10-CM | POA: Diagnosis present

## 2019-12-19 DIAGNOSIS — Z807 Family history of other malignant neoplasms of lymphoid, hematopoietic and related tissues: Secondary | ICD-10-CM

## 2019-12-19 DIAGNOSIS — D72819 Decreased white blood cell count, unspecified: Secondary | ICD-10-CM | POA: Diagnosis not present

## 2019-12-19 DIAGNOSIS — E162 Hypoglycemia, unspecified: Secondary | ICD-10-CM | POA: Diagnosis not present

## 2019-12-19 DIAGNOSIS — Z79899 Other long term (current) drug therapy: Secondary | ICD-10-CM

## 2019-12-19 DIAGNOSIS — D72829 Elevated white blood cell count, unspecified: Secondary | ICD-10-CM | POA: Diagnosis not present

## 2019-12-19 DIAGNOSIS — L89322 Pressure ulcer of left buttock, stage 2: Secondary | ICD-10-CM | POA: Diagnosis present

## 2019-12-19 DIAGNOSIS — R739 Hyperglycemia, unspecified: Secondary | ICD-10-CM | POA: Diagnosis present

## 2019-12-19 DIAGNOSIS — L89611 Pressure ulcer of right heel, stage 1: Secondary | ICD-10-CM | POA: Diagnosis present

## 2019-12-19 DIAGNOSIS — M4854XA Collapsed vertebra, not elsewhere classified, thoracic region, initial encounter for fracture: Secondary | ICD-10-CM | POA: Diagnosis present

## 2019-12-19 DIAGNOSIS — E860 Dehydration: Secondary | ICD-10-CM | POA: Diagnosis present

## 2019-12-19 DIAGNOSIS — R112 Nausea with vomiting, unspecified: Secondary | ICD-10-CM

## 2019-12-19 DIAGNOSIS — I10 Essential (primary) hypertension: Secondary | ICD-10-CM | POA: Diagnosis present

## 2019-12-19 DIAGNOSIS — I9589 Other hypotension: Secondary | ICD-10-CM | POA: Diagnosis present

## 2019-12-19 DIAGNOSIS — K869 Disease of pancreas, unspecified: Secondary | ICD-10-CM | POA: Diagnosis present

## 2019-12-19 DIAGNOSIS — K219 Gastro-esophageal reflux disease without esophagitis: Secondary | ICD-10-CM | POA: Diagnosis not present

## 2019-12-19 DIAGNOSIS — L8990 Pressure ulcer of unspecified site, unspecified stage: Secondary | ICD-10-CM | POA: Insufficient documentation

## 2019-12-19 DIAGNOSIS — R5381 Other malaise: Secondary | ICD-10-CM | POA: Diagnosis present

## 2019-12-19 DIAGNOSIS — R7401 Elevation of levels of liver transaminase levels: Secondary | ICD-10-CM | POA: Diagnosis present

## 2019-12-19 DIAGNOSIS — R0902 Hypoxemia: Secondary | ICD-10-CM | POA: Diagnosis not present

## 2019-12-19 DIAGNOSIS — I959 Hypotension, unspecified: Secondary | ICD-10-CM | POA: Diagnosis not present

## 2019-12-19 LAB — RESP PANEL BY RT PCR (RSV, FLU A&B, COVID)
Influenza A by PCR: NEGATIVE
Influenza B by PCR: NEGATIVE
Respiratory Syncytial Virus by PCR: NEGATIVE
SARS Coronavirus 2 by RT PCR: NEGATIVE

## 2019-12-19 MED ORDER — SENNA 8.6 MG PO TABS
1.0000 | ORAL_TABLET | Freq: Two times a day (BID) | ORAL | Status: DC
  Administered 2019-12-20 – 2019-12-22 (×4): 8.6 mg via ORAL
  Filled 2019-12-19 (×6): qty 1

## 2019-12-19 MED ORDER — DOCUSATE SODIUM 100 MG PO CAPS
100.0000 mg | ORAL_CAPSULE | Freq: Two times a day (BID) | ORAL | Status: DC
  Administered 2019-12-20 – 2019-12-22 (×5): 100 mg via ORAL
  Filled 2019-12-19 (×6): qty 1

## 2019-12-19 MED ORDER — ACETAMINOPHEN 325 MG PO TABS
650.0000 mg | ORAL_TABLET | Freq: Four times a day (QID) | ORAL | Status: DC | PRN
  Administered 2019-12-20: 650 mg via ORAL
  Filled 2019-12-19: qty 2

## 2019-12-19 MED ORDER — FILGRASTIM-SNDZ 480 MCG/0.8ML IJ SOSY
PREFILLED_SYRINGE | INTRAMUSCULAR | Status: AC
  Filled 2019-12-19: qty 0.8

## 2019-12-19 MED ORDER — FENOFIBRATE 54 MG PO TABS
54.0000 mg | ORAL_TABLET | Freq: Every day | ORAL | Status: DC
  Administered 2019-12-20 – 2020-01-02 (×11): 54 mg via ORAL
  Filled 2019-12-19 (×14): qty 1

## 2019-12-19 MED ORDER — DEXAMETHASONE 4 MG PO TABS
4.0000 mg | ORAL_TABLET | Freq: Three times a day (TID) | ORAL | Status: DC
  Administered 2019-12-19 – 2019-12-25 (×16): 4 mg via ORAL
  Filled 2019-12-19 (×17): qty 1

## 2019-12-19 MED ORDER — PRAVASTATIN SODIUM 20 MG PO TABS
40.0000 mg | ORAL_TABLET | Freq: Every day | ORAL | Status: DC
  Administered 2019-12-20 – 2020-01-02 (×11): 40 mg via ORAL
  Filled 2019-12-19 (×13): qty 2

## 2019-12-19 MED ORDER — FILGRASTIM-SNDZ 480 MCG/0.8ML IJ SOSY: 480.0000 ug | PREFILLED_SYRINGE | Freq: Once | INTRAMUSCULAR | Status: DC

## 2019-12-19 MED ORDER — POLYETHYLENE GLYCOL 3350 17 G PO PACK
17.0000 g | PACK | Freq: Every day | ORAL | Status: DC | PRN
  Administered 2019-12-21: 17 g via ORAL
  Filled 2019-12-19: qty 1

## 2019-12-19 MED ORDER — ALUM & MAG HYDROXIDE-SIMETH 200-200-20 MG/5ML PO SUSP
30.0000 mL | Freq: Once | ORAL | Status: AC
  Administered 2019-12-19: 30 mL via ORAL
  Filled 2019-12-19: qty 30

## 2019-12-19 MED ORDER — ONDANSETRON HCL 8 MG PO TABS: 8.0000 mg | ORAL_TABLET | Freq: Three times a day (TID) | ORAL | Status: DC | PRN

## 2019-12-19 MED ORDER — PROCHLORPERAZINE MALEATE 10 MG PO TABS: 10.0000 mg | ORAL_TABLET | Freq: Four times a day (QID) | ORAL | Status: DC | PRN

## 2019-12-19 MED ORDER — FERROUS SULFATE 325 (65 FE) MG PO TABS
325.0000 mg | ORAL_TABLET | Freq: Every day | ORAL | Status: DC
  Administered 2019-12-20 – 2020-01-02 (×12): 325 mg via ORAL
  Filled 2019-12-19 (×13): qty 1

## 2019-12-19 MED ORDER — ASPIRIN EC 81 MG PO TBEC
81.0000 mg | DELAYED_RELEASE_TABLET | Freq: Every day | ORAL | Status: DC
  Administered 2019-12-20 – 2019-12-25 (×6): 81 mg via ORAL
  Filled 2019-12-19 (×6): qty 1

## 2019-12-19 MED ORDER — OXYCODONE HCL 5 MG PO TABS
5.0000 mg | ORAL_TABLET | Freq: Four times a day (QID) | ORAL | Status: DC | PRN
  Administered 2019-12-19 – 2019-12-23 (×9): 10 mg via ORAL
  Administered 2019-12-24: 5 mg via ORAL
  Administered 2019-12-27: 10 mg via ORAL
  Administered 2019-12-29: 22:00:00 5 mg via ORAL
  Administered 2019-12-30: 21:00:00 10 mg via ORAL
  Administered 2019-12-30: 15:00:00 5 mg via ORAL
  Administered 2019-12-31 – 2020-01-02 (×4): 10 mg via ORAL
  Filled 2019-12-19 (×3): qty 2
  Filled 2019-12-19: qty 1
  Filled 2019-12-19 (×4): qty 2
  Filled 2019-12-19 (×2): qty 1
  Filled 2019-12-19 (×9): qty 2

## 2019-12-19 MED ORDER — METHOCARBAMOL 500 MG PO TABS
750.0000 mg | ORAL_TABLET | Freq: Three times a day (TID) | ORAL | Status: DC
  Administered 2019-12-19 – 2020-01-02 (×32): 750 mg via ORAL
  Filled 2019-12-19 (×35): qty 2

## 2019-12-19 MED ORDER — ACETAMINOPHEN 650 MG RE SUPP: 650.0000 mg | Freq: Four times a day (QID) | RECTAL | Status: DC | PRN

## 2019-12-19 NOTE — Patient Instructions (Signed)

## 2019-12-19 NOTE — Progress Notes (Signed)
Injection held today per Dr. Lorenso Courier.

## 2019-12-19 NOTE — H&P (Signed)
History and Physical    Kenneth Khan. [id number] DOB: Feb 23, 1966 DOA: 12/17/2019  PCP: Nicholes Rough, PA-C  Patient coming from: Home  Chief Complaint: Debility  HPI: Kenneth Khan. is a 54 y.o. male with medical history significant of hypertension, anemia and metastatic squamous cell cancer.  Patient presents as a direct admission from his oncologist secondary to debility and difficulty with transport to and from radiation therapy treatments.  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  Per patient it is getting very difficult to safely transport him to and from his treatments.  Patient's oncologist feels that he would be better served as an inpatient to decrease safety concerns and outpatient management.  Review of Systems: Review of Systems  Constitutional: Negative for chills and fever.  Respiratory: Negative for shortness of breath.   Cardiovascular: Negative for chest pain.  Gastrointestinal: Negative for nausea and vomiting.  Neurological: Positive for tingling (intermittently with some transfers), sensory change (intermittently with some transfers) and weakness. Negative for focal weakness.    Past Medical History:  Diagnosis Date  . Cancer ([place])   . Hypertension     Past Surgical History:  Procedure Laterality Date  . IR IMAGING GUIDED PORT INSERTION  11/19/2019  . NO PAST SURGERIES       reports that he has never smoked. He has never used smokeless tobacco. He reports current alcohol use. He reports that he does not use drugs.  No Known Allergies  Family History  Problem Relation Age of Onset  . Non-Hodgkin's lymphoma Father    Prior to Admission medications   Medication Sig Start Date End Date Taking? Authorizing Provider  aspirin EC 81 MG tablet Take 81 mg by mouth daily.    [provider]  dexamethasone (DECADRON) 4 MG tablet Take 1 tablet (4 mg total) by  mouth 3 (three) times daily. 12/06/19   Hayden Pedro, PA-C  fenofibrate 54 MG tablet Take 54 mg by mouth daily.    [provider]  ferrous sulfate 325 (65 FE) MG tablet Take 1 tablet (325 mg total) by mouth daily with breakfast. Take with orange juice. 11/30/19   Orson Slick, MD  lidocaine-prilocaine (EMLA) cream Apply 1 application topically as needed. Patient taking differently: Apply 1 application topically as needed (port access).  12/06/19   Orson Slick, MD  methocarbamol (ROBAXIN) 750 MG tablet Take 1 tablet (750 mg total) by mouth 3 (three) times daily. 12/06/19   Orson Slick, MD  ondansetron (ZOFRAN) 8 MG tablet Take 1 tablet (8 mg total) by mouth every 8 (eight) hours as needed for nausea or vomiting. 12/06/19   Orson Slick, MD  oxyCODONE (OXY IR/ROXICODONE) 5 MG immediate release tablet Take 1-2 tablets (5-10 mg total) by mouth every 6 (six) hours as needed for severe pain. 12/17/19   Orson Slick, MD  pravastatin (PRAVACHOL) 40 MG tablet Take 40 mg by mouth daily.    [provider]  prochlorperazine (COMPAZINE) 10 MG tablet Take 1 tablet (10 mg total) by mouth every 6 (six) hours as needed for nausea or vomiting. 12/06/19   Orson Slick, MD    Physical Exam:  Physical Exam Vitals reviewed.  Constitutional:      General: He is not in acute distress.    Appearance: He is well-developed. He is not diaphoretic.  HENT:  Mouth/Throat:     Mouth: Mucous membranes are dry.     Pharynx: Oropharynx is clear.  Eyes:     Conjunctiva/sclera: Conjunctivae normal.     Pupils: Pupils are equal, round, and reactive to light.  Cardiovascular:     Rate and Rhythm: Normal rate and regular rhythm.     Pulses:          Dorsalis pedis pulses are 1+ on the right side and 1+ on the left side.     Heart sounds: Normal heart sounds. No murmur.  Pulmonary:     Effort: Pulmonary effort is normal. No respiratory distress.     Breath sounds:  Normal breath sounds. No wheezing or rales.  Abdominal:     General: Bowel sounds are normal. There is no distension.     Palpations: Abdomen is soft.     Tenderness: There is no abdominal tenderness. There is no guarding or rebound.  Musculoskeletal:        General: No tenderness. Normal range of motion.     Cervical back: Normal range of motion.     Right lower leg: 2+ Pitting Edema present.     Left lower leg: 2+ Pitting Edema present.  Feet:     Comments: Cool extremities Lymphadenopathy:     Cervical: No cervical adenopathy.  Skin:    General: Skin is warm and dry.  Neurological:     Mental Status: He is alert and oriented to person, place, and time.      Labs on Admission: I have personally reviewed following labs and imaging studies  CBC: Recent Labs  Lab 12/17/19 1113  WBC 45.0*  NEUTROABS 42.3*  HGB 12.3*  HCT 42.2  MCV 76.4*  PLT 413*    Basic Metabolic Panel: Recent Labs  Lab 12/17/19 1113  NA 138  K 4.1  CL 104  CO2 23  GLUCOSE 220*  BUN 26*  CREATININE 0.78  CALCIUM 9.8  MG 2.8*    GFR: CrCl cannot be calculated (Unknown ideal weight.).  Liver Function Tests: Recent Labs  Lab 12/17/19 1113  AST 76*  ALT 68*  ALKPHOS 569*  BILITOT 3.0*  PROT 5.8*  ALBUMIN 2.0*   No results for input(s): LIPASE, AMYLASE in the last 168 hours. No results for input(s): AMMONIA in the last 168 hours.  Coagulation Profile: No results for input(s): INR, PROTIME in the last 168 hours.  Cardiac Enzymes: No results for input(s): CKTOTAL, CKMB, CKMBINDEX, TROPONINI in the last 168 hours.  BNP (last 3 results) No results for input(s): PROBNP in the last 8760 hours.  HbA1C: No results for input(s): HGBA1C in the last 72 hours.  CBG: No results for input(s): GLUCAP in the last 168 hours.  Lipid Profile: No results for input(s): CHOL, HDL, LDLCALC, TRIG, CHOLHDL, LDLDIRECT in the last 72 hours.  Thyroid Function Tests: No results for input(s): TSH,  T4TOTAL, FREET4, T3FREE, THYROIDAB in the last 72 hours.  Anemia Panel: No results for input(s): VITAMINB12, FOLATE, FERRITIN, TIBC, IRON, RETICCTPCT in the last 72 hours.  Urine analysis:    Component Value Date/Time   COLORURINE STRAW (A) 11/04/2019 0744   APPEARANCEUR CLEAR 11/04/2019 0744   LABSPEC 1.009 11/04/2019 [place] 7.0 11/04/2019 [place] 11/04/2019 0744   HGBUR NEGATIVE 11/04/2019 [place] 11/04/2019 [place] 11/04/2019 [place] 11/04/2019 0744   NITRITE NEGATIVE 11/04/2019 0744   LEUKOCYTESUR NEGATIVE 11/04/2019 0744  Radiological Exams on Admission: No results found.  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  This has made it very difficult for the patient to safely be transported to and from his radiation therapy treatments. -XRT treatments per radiation oncology -PT/OT eval  Metastatic squamous cell cancer Unknown primary. Currently on chemotherapy and radiation therapy. XRT is daily with end date planned for 2/12. Chemotherapy with FOLFOX started on 2/1 with one dose received to date per patient. Plan was for an infusion today but this was cancelled secondary to neutrophilia. CT scan mentions pancreatic tail lesion that has large contact surface with the splenic artery.   Back pain Pain is improved with narcotic regimen and radiation therapy.   Leukocytosis Discussed with oncology. Likely related to cancer in addition to steroids. -CBC in AM  Elevated AST/ALT/Alkaline phosphatase Secondary to metastatic disease  Iron deficiency anemia Likely secondary to blood loss from active malignancy per oncology. -Continue iron  supplementation  Hypermagnesemia Mildly elevated from recent CMP. -Repeat magnesium level in AM  Hypertension Patient states he was taken off of antihypertensives. Currently normotensive.   DVT prophylaxis: SCDs Code Status: Full code Family Communication: None at bedside Disposition Plan: Discharge once able to ambulate better Consults called: None Admission status: Inpatient   Cordelia Poche, MD [facility] 12/30/2019, 4:22 PM

## 2019-12-19 NOTE — Telephone Encounter (Signed)
Met with patient with Dr. Lorenso Courier prior to patient going to radiation. To discuss issues related to pt's safety at home, pain control, transporting pt to and from cancer center.  Pt is total lift at this time-cannot bear any weight. He is receiving radiation to T12, ribs and femurs due to bone mets. [name], his fiancee' has to get 2 neighbors every time he comes to the cancer center and when he returns home to get him in and out of the house. Pt is fearful of falling and has increased pain with attempts at mobility. Pt agreeable to hospital admission for pain control, radiation treatments, physical therapy.  Spoke with [name] as well. She voices understanding. Pt to had covid screening test done prior to being transported to [place].  Report called to 6E and transported via w/c.  [name] knows she can be the one to visit after his covid test comes back negative.

## 2019-12-19 NOTE — Progress Notes (Signed)
Will hold zarxio this cycle since ANC = [id number] on 2/1 per discussion with Dr Lorenso Courier

## 2019-12-19 NOTE — Progress Notes (Signed)
[place] CSW Progress Notes  CSW received email from care team members stating that patient cannot transport by car to scheduled radiation appts.  Cannot get himself into/out of car on his own, requires max assist to do so.  [place] team has requested ambulance transport so patient can come by stretcher.  At this time, patient does not have ramp, appropriate DME and home health and/or palliative care in place at home.  Transportation is being provided by significant other who gets assistance from others to get patient into/out of care at home as patient cannot walk or transfer on his own.    CSW called Christella Scheuermann - patient has coverage for ambulance transport only for urgent and emergent situations.  This would be evaluated when the claim is received.  Although patient has met his calendar year OOP and deductible maximums, insurance company will not provide an authorization number to guarantee payment.  PTAR requests physician certification of medical necessity and schedule of needed transports.  They will review and determine they will assist and whether they will require up front payment from family (rides are approx $650 each way).  PTAR will contact family to discuss options if upfront payment is requested.  Patient also needs home health.  CSW called Youth worker - home health Merchant navy officer for [employer].  Was given 4 names of [place] agencies possible.  Called suggested agencies.  [facility] does not have contract w [employer] for home health, [facility] only takes Christella Scheuermann if patient also has Medicaid, [facility] is not in network.  Final agency, [facility] [phone]) requests that desk RN fax over request to [phone] and they will verify insurance and determine if they can serve patient.  If not, we need to call [facility] [phone])  back and request assistance from their care management department.    Also per Christella Scheuermann, patient has been assigned a complex case manager at [employer] [phone]) - Christella Scheuermann  provided only a fax number for her [phone])  CSW has updated B Linus Orn, oncologist desk RN, to explain patient options.  Can contact patient when appropriate.  Edwyna Shell, LCSW Clinical Social Worker Phone:  [phone] Cell:  [phone]

## 2019-12-19 NOTE — Progress Notes (Signed)
[place] CSW Progress Notes  Patient has been assigned a Armed forces operational officer from [employer].  Phone [phone].  Fax [phone].  Left VM for RN CM requesting call back.  Edwyna Shell, LCSW Clinical Social Worker Phone:  [phone] Cell:  [phone]

## 2019-12-20 ENCOUNTER — Telehealth: Payer: Self-pay | Admitting: General Practice

## 2019-12-20 ENCOUNTER — Ambulatory Visit: Payer: Managed Care, Other (non HMO)

## 2019-12-20 ENCOUNTER — Inpatient Hospital Stay: Payer: Managed Care, Other (non HMO)

## 2019-12-20 ENCOUNTER — Ambulatory Visit
Admission: RE | Admit: 2019-12-20 | Discharge: 2019-12-20 | Disposition: A | Discharge status: Home / Self Care | Payer: Managed Care, Other (non HMO) | Source: Ambulatory Visit | Attending: Radiation Oncology | Admitting: Radiation Oncology

## 2019-12-20 DIAGNOSIS — R799 Abnormal finding of blood chemistry, unspecified: Secondary | ICD-10-CM

## 2019-12-20 LAB — CBC WITH DIFFERENTIAL/PLATELET
Abs Immature Granulocytes: 0.44 10*3/uL — ABNORMAL HIGH (ref 0.00–0.07)
Basophils Absolute: 0.1 10*3/uL (ref 0.0–0.1)
Basophils Relative: 0 %
Eosinophils Absolute: 0 10*3/uL (ref 0.0–0.5)
Eosinophils Relative: 0 %
HCT: 39.5 % (ref 39.0–52.0)
Hemoglobin: 11.6 g/dL — ABNORMAL LOW (ref 13.0–17.0)
Immature Granulocytes: 2 %
Lymphocytes Relative: 0 %
Lymphs Abs: 0.1 10*3/uL — ABNORMAL LOW (ref 0.7–4.0)
MCH: 22.3 pg — ABNORMAL LOW (ref 26.0–34.0)
MCHC: 29.4 g/dL — ABNORMAL LOW (ref 30.0–36.0)
MCV: 76 fL — ABNORMAL LOW (ref 80.0–100.0)
Monocytes Absolute: 0.1 10*3/uL (ref 0.1–1.0)
Monocytes Relative: 1 %
Neutro Abs: 25 10*3/uL — ABNORMAL HIGH (ref 1.7–7.7)
Neutrophils Relative %: 97 %
Platelets: 157 10*3/uL (ref 150–400)
RBC: 5.2 MIL/uL (ref 4.22–5.81)
RDW: 22.7 % — ABNORMAL HIGH (ref 11.5–15.5)
WBC: 25.7 10*3/uL — ABNORMAL HIGH (ref 4.0–10.5)
nRBC: 0 % (ref 0.0–0.2)

## 2019-12-20 LAB — COMPREHENSIVE METABOLIC PANEL
ALT: 83 U/L — ABNORMAL HIGH (ref 0–44)
AST: 103 U/L — ABNORMAL HIGH (ref 15–41)
Albumin: 2.1 g/dL — ABNORMAL LOW (ref 3.5–5.0)
Alkaline Phosphatase: 455 U/L — ABNORMAL HIGH (ref 38–126)
Anion gap: 9 (ref 5–15)
BUN: 35 mg/dL — ABNORMAL HIGH (ref 6–20)
CO2: 25 mmol/L (ref 22–32)
Calcium: 8 mg/dL — ABNORMAL LOW (ref 8.9–10.3)
Chloride: 100 mmol/L (ref 98–111)
Creatinine, Ser: 0.73 mg/dL (ref 0.61–1.24)
GFR calc Af Amer: >60 mL/min (ref >60)
GFR calc non Af Amer: >60 mL/min (ref >60)
Glucose, Bld: 267 mg/dL — ABNORMAL HIGH (ref 70–99)
Potassium: 4.4 mmol/L (ref 3.5–5.1)
Sodium: 134 mmol/L — ABNORMAL LOW (ref 135–145)
Total Bilirubin: 4.2 mg/dL — ABNORMAL HIGH (ref 0.3–1.2)
Total Protein: 5.3 g/dL — ABNORMAL LOW (ref 6.5–8.1)

## 2019-12-20 LAB — MAGNESIUM: Magnesium: 3 mg/dL — ABNORMAL HIGH (ref 1.7–2.4)

## 2019-12-20 LAB — GLUCOSE, CAPILLARY
Glucose-Capillary: 352 mg/dL — ABNORMAL HIGH (ref 70–99)
Glucose-Capillary: 306 mg/dL — ABNORMAL HIGH (ref 70–99)

## 2019-12-20 LAB — PROTIME-INR
INR: 1.3 — ABNORMAL HIGH (ref 0.8–1.2)
Prothrombin Time: 16.2 seconds — ABNORMAL HIGH (ref 11.4–15.2)

## 2019-12-20 MED ORDER — INSULIN ASPART 100 UNIT/ML ~~LOC~~ SOLN
0.0000 [IU] | Freq: Three times a day (TID) | SUBCUTANEOUS | Status: DC
  Administered 2019-12-20: 15 [IU] via SUBCUTANEOUS
  Administered 2019-12-21: 5 [IU] via SUBCUTANEOUS
  Administered 2019-12-21: 12 [IU] via SUBCUTANEOUS
  Administered 2019-12-21: 8 [IU] via SUBCUTANEOUS
  Administered 2019-12-22: 2 [IU] via SUBCUTANEOUS
  Administered 2019-12-22: 5 [IU] via SUBCUTANEOUS
  Administered 2019-12-22: 3 [IU] via SUBCUTANEOUS
  Administered 2019-12-23: 8 [IU] via SUBCUTANEOUS
  Administered 2019-12-23: 2 [IU] via SUBCUTANEOUS
  Administered 2019-12-23: 3 [IU] via SUBCUTANEOUS

## 2019-12-20 MED ORDER — MELATONIN 3 MG PO TABS
6.0000 mg | ORAL_TABLET | Freq: Every evening | ORAL | Status: DC | PRN
  Administered 2019-12-20 – 2019-12-30 (×2): 6 mg via ORAL
  Filled 2019-12-20 (×2): qty 2

## 2019-12-20 MED ORDER — SODIUM CHLORIDE 0.9 % IV SOLN: INTRAVENOUS | Status: DC

## 2019-12-20 MED ORDER — LIP MEDEX EX OINT
TOPICAL_OINTMENT | CUTANEOUS | Status: AC
  Filled 2019-12-20: qty 7

## 2019-12-20 MED ORDER — NON FORMULARY: 6.0000 mg | Freq: Every evening | Status: DC | PRN

## 2019-12-20 MED ORDER — CALCIUM CARBONATE ANTACID 500 MG PO CHEW
1.0000 | CHEWABLE_TABLET | Freq: Three times a day (TID) | ORAL | Status: DC | PRN
  Administered 2019-12-21 – 2019-12-30 (×4): 200 mg via ORAL
  Filled 2019-12-20 (×4): qty 1

## 2019-12-20 NOTE — Progress Notes (Addendum)
PROGRESS NOTE    Kenneth Khan.  [id number] DOB: [birth year] DOA: 12/25/2019 PCP: Nicholes Rough, PA-C   Brief Narrative: Kenneth Khan. is a 54 y.o. 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 Patient presented from his oncologists office secondary to severe functional disability with safety concerns for transport to/from XRT.   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  This has made it very difficult for the patient to safely be transported to and from his radiation therapy treatments. -XRT per radiation oncology -PT/OT eval pending  Metastatic squamous cell cancer Unknown primary. Currently on chemotherapy and radiation therapy. XRT is daily with end date planned for 2/12. Chemotherapy with FOLFOX started on 2/1 with one dose received to date per patient. Plan was for an infusion today but this was cancelled secondary to neutrophilia. CT scan mentions pancreatic tail lesion that has large contact surface with the splenic artery.   Back pain Pain is improved with narcotic regimen and radiation therapy.   Leukocytosis Discussed with oncology. Likely related to cancer in addition to steroids. WBC improved. Neutrophilia improved.  Elevated AST/ALT/Alkaline phosphatase Secondary to metastatic disease  Elevated BUN Likely some dehydration but also concern of possible chronic GI bleeding in setting of metastatic disease. Hemoglobin stable. -Start normal saline IV fluids  Iron deficiency anemia Likely secondary to blood loss from active malignancy per oncology. -Continue iron supplementation  Hypermagnesemia Elevated with a magnesium of 3. Recently received  Maalox/Mylanta but unsure if this contributed to increase -Hold magnesium containing treatments -IV fluids -Repeat magnesium level in AM  Hypertension Patient states he was taken off of antihypertensives. Currently normotensive.  Hyperglycemia Likely steroid induced -SSI -Hemoglobin A1C   DVT prophylaxis: SCDs Code Status:   Code Status: Full Code Family Communication: None at bedside Disposition Plan: Discharge pending XRT completion vs improvement in functional ability enough to allow safe transport to/from treatments   Consultants:   None  Procedures:   XRT  Antimicrobials:  None    Subjective: Some heartburn overnight. No other issues.  Objective: Vitals:   01/06/2020 1542 12/18/2019 2335 12/20/19 0701 12/20/19 1304  BP: 111/78 113/84 114/78 119/78  Pulse: 94 100 (!) 109 (!) 104  Resp: 18 16 16 19   Temp: 97.7 F (36.5 C) 97.7 F (36.5 C) (!) 97.5 F (36.4 C) (!) 97.5 F (36.4 C)  TempSrc: Oral Oral Oral Oral  SpO2: 95% 92% 92% 90%    Intake/Output Summary (Last 24 hours) at 12/20/2019 1311 Last data filed at 12/20/2019 1301 Gross per 24 hour  Intake -  Output 975 ml  Net -975 ml   There were no vitals filed for this visit.  Examination:  General exam: Appears calm and comfortable Respiratory system: Clear to auscultation. Respiratory effort normal. Cardiovascular system: S1 & S2 heard, RRR. No murmurs, rubs, gallops or clicks. Gastrointestinal system: Abdomen is nondistended, soft and nontender. No organomegaly or masses felt. Normal bowel sounds heard. Central nervous system: Alert and oriented. 3/5 LE strength bilaterally Extremities: No edema. No calf tenderness Skin: No cyanosis. No rashes Psychiatry: Judgement and insight appear normal. Mood & affect appropriate.     Data Reviewed: I have personally reviewed following labs and imaging studies  CBC:  Recent Labs  Lab 12/17/19 1113 12/20/19 0654  WBC 45.0* 25.7*  NEUTROABS 42.3* 25.0*   HGB 12.3* 11.6*  HCT 42.2 39.5  MCV 76.4* 76.0*  PLT 413* [id number]   Basic Metabolic Panel: Recent Labs  Lab 12/17/19 1113 12/20/19 0654  NA 138 134*  K 4.1 4.4  CL 104 100  CO2 23 25  GLUCOSE 220* 267*  BUN 26* 35*  CREATININE 0.78 0.73  CALCIUM 9.8 8.0*  MG 2.8* 3.0*   GFR: CrCl cannot be calculated (Unknown ideal weight.). Liver Function Tests: Recent Labs  Lab 12/17/19 1113 12/20/19 0654  AST 76* 103*  ALT 68* 83*  ALKPHOS 569* 455*  BILITOT 3.0* 4.2*  PROT 5.8* 5.3*  ALBUMIN 2.0* 2.1*   No results for input(s): LIPASE, AMYLASE in the last 168 hours. No results for input(s): AMMONIA in the last 168 hours. Coagulation Profile: Recent Labs  Lab 12/20/19 0654  INR 1.3*   Cardiac Enzymes: No results for input(s): CKTOTAL, CKMB, CKMBINDEX, TROPONINI in the last 168 hours. BNP (last 3 results) No results for input(s): PROBNP in the last 8760 hours. HbA1C: No results for input(s): HGBA1C in the last 72 hours. CBG: No results for input(s): GLUCAP in the last 168 hours. Lipid Profile: No results for input(s): CHOL, HDL, LDLCALC, TRIG, CHOLHDL, LDLDIRECT in the last 72 hours. Thyroid Function Tests: No results for input(s): TSH, T4TOTAL, FREET4, T3FREE, THYROIDAB in the last 72 hours. Anemia Panel: No results for input(s): VITAMINB12, FOLATE, FERRITIN, TIBC, IRON, RETICCTPCT in the last 72 hours. Sepsis Labs: No results for input(s): PROCALCITON, LATICACIDVEN in the last 168 hours.  Recent Results (from the past 240 hour(s))  Resp Panel by RT PCR (RSV, Flu A&B, Covid) -     Status: None   Collection Time: 01/13/2020  3:20 PM  Result Value Ref Range Status   SARS Coronavirus 2 by RT PCR NEGATIVE NEGATIVE Final    Comment: (NOTE) SARS-CoV-2 target nucleic acids are NOT DETECTED. The SARS-CoV-2 RNA is generally detectable in upper respiratoy specimens during the acute phase of infection. The lowest concentration of SARS-CoV-2 viral copies this assay can detect is  131 copies/mL. A negative result does not preclude SARS-Cov-2 infection and should not be used as the sole basis for treatment or other patient management decisions. A negative result may occur with  improper specimen collection/handling, submission of specimen other than nasopharyngeal swab, presence of viral mutation(s) within the areas targeted by this assay, and inadequate number of viral copies (<131 copies/mL). A negative result must be combined with clinical observations, patient history, and epidemiological information. The expected result is Negative. Fact Sheet for Patients:  PinkCheek.be Fact Sheet for Healthcare Providers:  GravelBags.it This test is not yet ap proved or cleared by the [place] FDA and  has been authorized for detection and/or diagnosis of SARS-CoV-2 by FDA under an Emergency Use Authorization (EUA). This EUA will remain  in effect (meaning this test can be used) for the duration of the COVID-19 declaration under Section 564(b)(1) of the Act, 21 U.S.C. section 360bbb-3(b)(1), unless the authorization is terminated or revoked sooner.    Influenza A by PCR NEGATIVE NEGATIVE Final   Influenza B by PCR NEGATIVE NEGATIVE Final    Comment: (NOTE) The Xpert Xpress SARS-CoV-2/FLU/RSV assay is intended as an aid in  the diagnosis of influenza from Nasopharyngeal swab specimens and  should not be used as a sole basis for treatment. Nasal washings and  aspirates are unacceptable for Xpert Xpress  SARS-CoV-2/FLU/RSV  testing. Fact Sheet for Patients: PinkCheek.be Fact Sheet for Healthcare Providers: GravelBags.it This test is not yet approved or cleared by the [place] FDA and  has been authorized for detection and/or diagnosis of SARS-CoV-2 by  FDA under an Emergency Use Authorization (EUA). This EUA will remain  in effect (meaning this test  can be used) for the duration of the  Covid-19 declaration under Section 564(b)(1) of the Act, 21  U.S.C. section 360bbb-3(b)(1), unless the authorization is  terminated or revoked.    Respiratory Syncytial Virus by PCR NEGATIVE NEGATIVE Final    Comment: (NOTE) Fact Sheet for Patients: PinkCheek.be Fact Sheet for Healthcare Providers: GravelBags.it This test is not yet approved or cleared by the [place] FDA and  has been authorized for detection and/or diagnosis of SARS-CoV-2 by  FDA under an Emergency Use Authorization (EUA). This EUA will remain  in effect (meaning this test can be used) for the duration of the  COVID-19 declaration under Section 564(b)(1) of the Act, 21 U.S.C.  section 360bbb-3(b)(1), unless the authorization is terminated or  revoked. Performed at [facility], [place] [address], [place], [place] [address]          Radiology Studies: No results found.      Scheduled Meds: . aspirin EC  81 mg Oral Daily  . dexamethasone  4 mg Oral TID  . docusate sodium  100 mg Oral BID  . fenofibrate  54 mg Oral Daily  . ferrous sulfate  325 mg Oral Q breakfast  . methocarbamol  750 mg Oral TID  . pravastatin  40 mg Oral Daily  . senna  1 tablet Oral BID   Continuous Infusions:   LOS: 1 day     Cordelia Poche, MD [facility] 12/20/2019, 1:11 PM  If 7PM-7AM, please contact night-coverage [email or web address]

## 2019-12-20 NOTE — Progress Notes (Signed)
OT Cancellation Note  Patient Details Name: Kenneth Khan. MRN: [id number] DOB: 1965-11-25   Cancelled Treatment:    Reason Eval/Treat Not Completed: Patient at procedure or test/ unavailable  [place], Mickel Baas, OT Acute Rehabilitation Services [phone] [phone]    12/20/2019, 1:53 PM

## 2019-12-20 NOTE — Progress Notes (Signed)
This patient was seen for collection of a COVID-19, influenza A and B, and RSV screen prior to an admission to [facility] today.  A sample was collected successfully and returned negative for all items screened.  Sandi Mealy, MHS, PA-C Physician Assistant

## 2019-12-20 NOTE — Progress Notes (Signed)
Rehab Admissions Coordinator Note:  Patient was screened by Cleatrice Burke for appropriateness for an Inpatient Acute Rehab Consult per PT recs.  At this time, we are recommending Inpatient Rehab consult if you would like [place] to consider pt for CIR admit . Please advise. Transport for daily Radiation treatments would have to be arranged to [facility] daily.  Cleatrice Burke  RN MSN 12/20/2019, 2:30 PM  I can be reached at [phone].

## 2019-12-20 NOTE — Evaluation (Signed)
Physical Therapy Evaluation Patient Details Name: Kenneth Khan. MRN: [id number] DOB: October 05, 1966 Today's Date: 12/20/2019   History of Present Illness  Pt is 54 y.o. 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   Patient presents as a direct admission from his oncologist secondary to debility and difficulty with transport to and from radiation therapy treatments.  Pt admitted with ambulatory dysfunction.    MRI of T spine 11/04/20 revealed Metastatic lesions at T1, T12, and L1. There is moderate compression deformity of T12 with mild ventral epidural extension but no cord compression. Additional diffusely abnormal marrow signal may reflecthematopoietic marrow given anemia or possibly more infiltrative metastatic disease.   Clinical Impression  Pt admitted with above diagnosis. Pt presenting with significant LE weakness and decreased trunk control/balance.  He required mod A x 2 for transfers and use of Stedy for standing.  Pt very motivated to improve mobility.  He was returned to bed as he was going for radiation therapy in a few minutes.  Would benefit from PT to advance as able.  Pt currently with functional limitations due to the deficits listed below (see PT Problem List). Pt will benefit from skilled PT to increase their independence and safety with mobility to allow discharge to the venue listed below.       Follow Up Recommendations CIR    Equipment Recommendations  Other (comment)(TBD next venue)    Recommendations for Other Services Rehab consult     Precautions / Restrictions Precautions Precautions: Fall      Mobility  Bed Mobility Overal bed mobility: Needs Assistance Bed Mobility: Supine to Sit;Sit to Supine     Supine to sit: Mod assist;+2 for safety/equipment Sit to supine: Mod assist;+2 for safety/equipment   General bed mobility comments: cued for log roll and use of bed  rails  Transfers Overall transfer level: Needs assistance   Transfers: Sit to/from Stand Sit to Stand: +2 physical assistance;Mod assist         General transfer comment: Performed sit to stand x 1 from bed and x 1 from [place].  Required mod A of 2 to boost, bed elevated, and knees blocked; cued for technique  Ambulation/Gait             General Gait Details: deferred  Stairs            Wheelchair Mobility    Modified Rankin (Stroke Patients Only)       Balance Overall balance assessment: Needs assistance Sitting-balance support: Bilateral upper extremity supported;Feet supported Sitting balance-Leahy Scale: Poor Sitting balance - Comments: required use of UE     Standing balance-Leahy Scale: Zero                               Pertinent Vitals/Pain Pain Assessment: Faces Faces Pain Scale: Hurts little more Pain Location: Back - constant Pain Descriptors / Indicators: Aching;Discomfort;Grimacing Pain Intervention(s): Limited activity within patient's tolerance;Monitored during session;Repositioned    Home Living Family/patient expects to be discharged to:: Private residence Living Arrangements: Spouse/significant other Available Help at Discharge: Family;Available PRN/intermittently(Reports there 85% of the time) Type of Home: House Home Access: Ramped entrance Entrance Stairs-Rails: Right;Left;Can reach both Entrance Stairs-Number of Steps: 4 Home Layout: Two level;Able to live on main level with bedroom/bathroom(half bath on first floor) Home Equipment: Bedside commode;Shower seat;Walker - 4 wheels;Wheelchair - manual      Prior  Function Level of Independence: Needs assistance   Gait / Transfers Assistance Needed: Had been able to get to RW and household ambulation but reports gradual decline over past 2 weeks to the point of where strong friends were picking him up and putting him in a car.  ADL's / Homemaking Assistance Needed:  Reports fiancee assisting with sponge baths        Hand Dominance        Extremity/Trunk Assessment   Upper Extremity Assessment Upper Extremity Assessment: Overall WFL for tasks assessed    Lower Extremity Assessment Lower Extremity Assessment: LLE deficits/detail;RLE deficits/detail RLE Deficits / Details: ROM WFL; MMT hip 2/5, knee 4-/5, ankle 4/5 RLE Sensation: WNL RLE Coordination: WNL LLE Deficits / Details: ROM WFL; MMT hip 2/5, knee 4-/5, ankle 4/5 LLE Sensation: WNL LLE Coordination: WNL    Cervical / Trunk Assessment Cervical / Trunk Assessment: Kyphotic;Other exceptions Cervical / Trunk Exceptions: Does have poor trunk control at EOB , required use of UE for balance at times, if leaned forward to far had difficulty correcting  Communication   Communication: No difficulties  Cognition Arousal/Alertness: Awake/alert Behavior During Therapy: WFL for tasks assessed/performed Overall Cognitive Status: Within Functional Limits for tasks assessed                                        General Comments General comments (skin integrity, edema, etc.): Discussed and educated on role of acute PT and POC.  Discussed would see 3 x week and more if possible, work on transfer techniques including possible sliding board, encouraged AROM as able (had pt demo heel slides and abd/add and SAQ), and recommendation for further rehab at this point.  Notified RN pt would benefit from trapeze    Exercises     Assessment/Plan    PT Assessment Patient needs continued PT services  PT Problem List Decreased strength;Decreased mobility;Decreased safety awareness;Decreased range of motion;Decreased knowledge of precautions;Decreased activity tolerance;Decreased balance;Decreased knowledge of use of DME       PT Treatment Interventions DME instruction;Therapeutic activities;Gait training;Therapeutic exercise;Patient/family education;Balance training;Wheelchair mobility  training;Functional mobility training    PT Goals (Current goals can be found in the Care Plan section)  Acute Rehab PT Goals Patient Stated Goal: be able to walk/transfer PT Goal Formulation: With patient Time For Goal Achievement: 01/03/20 Potential to Achieve Goals: Fair Additional Goals Additional Goal #1: Sliding board transfer to chair with min guard    Frequency Min 3X/week   Barriers to discharge Decreased caregiver support      Co-evaluation               AM-PAC PT "6 Clicks" Mobility  Outcome Measure Help needed turning from your back to your side while in a flat bed without using bedrails?: A Lot Help needed moving from lying on your back to sitting on the side of a flat bed without using bedrails?: A Lot Help needed moving to and from a bed to a chair (including a wheelchair)?: Total Help needed standing up from a chair using your arms (e.g., wheelchair or bedside chair)?: Total Help needed to walk in hospital room?: Total Help needed climbing 3-5 steps with a railing? : Total 6 Click Score: 8    End of Session Equipment Utilized During Treatment: Gait belt Activity Tolerance: Patient tolerated treatment well Patient left: in bed;with call bell/phone within reach;with bed alarm set Nurse Communication:  Mobility status;Need for lift equipment PT Visit Diagnosis: Unsteadiness on feet (R26.81);Other abnormalities of gait and mobility (R26.89);Muscle weakness (generalized) (M62.81)    Time: [id number] PT Time Calculation (min) (ACUTE ONLY): 40 min   Charges:   PT Evaluation $PT Eval Low Complexity: 1 Low PT Treatments $Therapeutic Activity: 8-22 mins        Maggie Font, PT Acute Rehab Services Pager [phone] [facility] [phone] [facility] [place] 12/20/2019, 2:08 PM

## 2019-12-20 NOTE — Telephone Encounter (Signed)
[place] CSW Progress Notes  Spoke w patient's caregiver, Lorita Officer.  Discussed need to have wheelchair transport option available when patient returns home.  She has gotten ramp built, so wheelchair can be used to exit home.  However, she needs help from neighbors in order to lift patient into chair.  Pt will need DME in order to assist w this task.  Caregiver can wheel patient up/down ramp.  Patient's home is served by [employer] [facility] [employer] which does have wheelchair vans.  There may be a wait list for service per provider.  Application secure emailed to Ms Maisie Fus for her signature, will be submitted to provider today and we will await their decision.  Edwyna Shell, LCSW Clinical Social Worker Phone:  [phone] Cell:  [phone]

## 2019-12-21 ENCOUNTER — Ambulatory Visit: Payer: Managed Care, Other (non HMO)

## 2019-12-21 ENCOUNTER — Inpatient Hospital Stay (HOSPITAL_COMMUNITY): Payer: Managed Care, Other (non HMO)

## 2019-12-21 ENCOUNTER — Inpatient Hospital Stay: Payer: Managed Care, Other (non HMO)

## 2019-12-21 DIAGNOSIS — R12 Heartburn: Secondary | ICD-10-CM

## 2019-12-21 DIAGNOSIS — D72829 Elevated white blood cell count, unspecified: Secondary | ICD-10-CM

## 2019-12-21 DIAGNOSIS — C801 Malignant (primary) neoplasm, unspecified: Secondary | ICD-10-CM

## 2019-12-21 DIAGNOSIS — R109 Unspecified abdominal pain: Secondary | ICD-10-CM

## 2019-12-21 DIAGNOSIS — D509 Iron deficiency anemia, unspecified: Secondary | ICD-10-CM

## 2019-12-21 DIAGNOSIS — K5900 Constipation, unspecified: Secondary | ICD-10-CM

## 2019-12-21 LAB — BASIC METABOLIC PANEL
Anion gap: 9 (ref 5–15)
BUN: 33 mg/dL — ABNORMAL HIGH (ref 6–20)
CO2: 23 mmol/L (ref 22–32)
Calcium: 7.9 mg/dL — ABNORMAL LOW (ref 8.9–10.3)
Chloride: 102 mmol/L (ref 98–111)
Creatinine, Ser: 0.64 mg/dL (ref 0.61–1.24)
GFR calc Af Amer: >60 mL/min (ref >60)
GFR calc non Af Amer: >60 mL/min (ref >60)
Glucose, Bld: 288 mg/dL — ABNORMAL HIGH (ref 70–99)
Potassium: 4.7 mmol/L (ref 3.5–5.1)
Sodium: 134 mmol/L — ABNORMAL LOW (ref 135–145)

## 2019-12-21 LAB — CBC
HCT: 37.1 % — ABNORMAL LOW (ref 39.0–52.0)
Hemoglobin: 10.7 g/dL — ABNORMAL LOW (ref 13.0–17.0)
MCH: 22.2 pg — ABNORMAL LOW (ref 26.0–34.0)
MCHC: 28.8 g/dL — ABNORMAL LOW (ref 30.0–36.0)
MCV: 77 fL — ABNORMAL LOW (ref 80.0–100.0)
Platelets: 139 10*3/uL — ABNORMAL LOW (ref 150–400)
RBC: 4.82 MIL/uL (ref 4.22–5.81)
RDW: 22.4 % — ABNORMAL HIGH (ref 11.5–15.5)
WBC: 22.4 10*3/uL — ABNORMAL HIGH (ref 4.0–10.5)
nRBC: 0 % (ref 0.0–0.2)

## 2019-12-21 LAB — GLUCOSE, CAPILLARY
Glucose-Capillary: 257 mg/dL — ABNORMAL HIGH (ref 70–99)
Glucose-Capillary: 212 mg/dL — ABNORMAL HIGH (ref 70–99)
Glucose-Capillary: 236 mg/dL — ABNORMAL HIGH (ref 70–99)

## 2019-12-21 LAB — HEMOGLOBIN A1C
Hgb A1c MFr Bld: 5.9 % — ABNORMAL HIGH (ref 4.8–5.6)
Mean Plasma Glucose: 122.63 mg/dL

## 2019-12-21 LAB — MAGNESIUM: Magnesium: 2.6 mg/dL — ABNORMAL HIGH (ref 1.7–2.4)

## 2019-12-21 MED ORDER — PANTOPRAZOLE SODIUM 40 MG IV SOLR
40.0000 mg | Freq: Two times a day (BID) | INTRAVENOUS | Status: DC
  Administered 2019-12-21 – 2019-12-27 (×13): 40 mg via INTRAVENOUS
  Filled 2019-12-21 (×13): qty 40

## 2019-12-21 MED ORDER — CHLORHEXIDINE GLUCONATE CLOTH 2 % EX PADS
6.0000 | MEDICATED_PAD | Freq: Every day | CUTANEOUS | Status: DC
  Administered 2019-12-21 – 2020-01-03 (×14): 6 via TOPICAL

## 2019-12-21 MED ORDER — INSULIN GLARGINE 100 UNIT/ML ~~LOC~~ SOLN
15.0000 [IU] | Freq: Every day | SUBCUTANEOUS | Status: DC
  Administered 2019-12-21 – 2019-12-22 (×2): 15 [IU] via SUBCUTANEOUS
  Filled 2019-12-21 (×3): qty 0.15

## 2019-12-21 MED ORDER — PANTOPRAZOLE SODIUM 40 MG PO TBEC
40.0000 mg | DELAYED_RELEASE_TABLET | Freq: Every day | ORAL | Status: DC
  Administered 2019-12-21: 09:00:00 40 mg via ORAL
  Filled 2019-12-21: qty 1

## 2019-12-21 MED ORDER — POLYETHYLENE GLYCOL 3350 17 G PO PACK
17.0000 g | PACK | Freq: Two times a day (BID) | ORAL | Status: DC
  Administered 2019-12-22: 17 g via ORAL
  Filled 2019-12-21 (×3): qty 1

## 2019-12-21 NOTE — Progress Notes (Signed)
PROGRESS NOTE    Kenneth Khan.  LX:[id number] DOB: October 21, 1966 DOA: 12/31/2019 PCP: Nicholes Rough, PA-C   Brief Narrative: Kenneth Gurung. is a 54 y.o. 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 Patient presented from his oncologists office secondary to severe functional disability with safety concerns for transport to/from XRT.   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  This has made it very difficult for the patient to safely be transported to and from his radiation therapy treatments. -XRT per radiation oncology -PT/OT eval pending  Metastatic squamous cell cancer Unknown primary. Currently on chemotherapy and radiation therapy. XRT is daily with end date planned for 2/12. Chemotherapy with FOLFOX started on 2/1 with one dose received to date per patient. Plan was for an infusion today but this was cancelled secondary to neutrophilia. CT scan mentions pancreatic tail lesion that has large contact surface with the splenic artery.   Back pain Pain is improved with narcotic regimen and radiation therapy.   Abdominal distension Likely secondary to constipation in setting of opiate use. -Bowel regimen -Abdominal x-ray to rule out ileus/obstruction  Leukocytosis Discussed with oncology. Likely related to cancer in addition to steroids. WBC improved. Neutrophilia improved.  Elevated AST/ALT/Alkaline phosphatase Secondary to metastatic disease  Elevated BUN Likely some dehydration but also concern of possible chronic GI bleeding in setting of metastatic disease. Hemoglobin stable. -Continue normal saline IV fluids  Iron deficiency anemia Likely secondary to blood  loss from active malignancy per oncology. -Continue iron supplementation  Hypermagnesemia Elevated with a magnesium of 3. Recently received Maalox/Mylanta but unsure if this contributed to increase. Trending down. -Hold magnesium containing treatments -IV fluids -Repeat magnesium level in AM  Hypertension Patient states he was taken off of antihypertensives. Currently normotensive.  Hyperglycemia Likely steroid induced. Hemoglobin A1C of 5.9% -SSI -Add Lantus 15 units daily   DVT prophylaxis: SCDs Code Status:   Code Status: Full Code Family Communication: None at bedside Disposition Plan: Discharge pending XRT completion vs improvement in functional ability enough to allow safe transport to/from treatments   Consultants:   None  Procedures:   XRT  Antimicrobials:  None    Subjective: Some continued indigestion. No bowel movement.  Objective: Vitals:   12/20/19 0701 12/20/19 1304 12/20/19 2215 12/21/19 0541  BP: 114/78 119/78 115/73 124/80  Pulse: (!) 109 (!) 104 (!) 103 (!) 106  Resp: 16 19 18 18   Temp: (!) 97.5 F (36.4 C) (!) 97.5 F (36.4 C) (!) 97.3 F (36.3 C) (!) 97.3 F (36.3 C)  TempSrc: Oral Oral Oral Oral  SpO2: 92% 90% 91% 92%    Intake/Output Summary (Last 24 hours) at 12/21/2019 1220 Last data filed at 12/21/2019 0400 Gross per 24 hour  Intake 1005.44 ml  Output 100 ml  Net 905.44 ml   There were no vitals filed for this visit.  Examination:  General exam: Appears calm and comfortable Respiratory system: Clear to auscultation. Respiratory effort normal. Cardiovascular system: S1 & S2 heard, RRR. No murmurs, rubs, gallops or clicks. Gastrointestinal system: Abdomen is distended, soft and nontender. No organomegaly or masses felt. Normal bowel sounds heard. Central nervous system: Alert and oriented. No focal neurological deficits. Extremities: No edema. No  calf tenderness Skin: No cyanosis. No rashes Psychiatry: Judgement and insight  appear normal. Mood & affect appropriate.     Data Reviewed: I have personally reviewed following labs and imaging studies  CBC: Recent Labs  Lab 12/17/19 1113 12/20/19 0654 12/21/19 0527  WBC 45.0* 25.7* 22.4*  NEUTROABS 42.3* 25.0*  --   HGB 12.3* 11.6* 10.7*  HCT 42.2 39.5 37.1*  MCV 76.4* 76.0* 77.0*  PLT 413* 157 [id number]*   Basic Metabolic Panel: Recent Labs  Lab 12/17/19 1113 12/20/19 0654 12/21/19 0527  NA 138 134* 134*  K 4.1 4.4 4.7  CL 104 100 102  CO2 23 25 23   GLUCOSE 220* 267* 288*  BUN 26* 35* 33*  CREATININE 0.78 0.73 0.64  CALCIUM 9.8 8.0* 7.9*  MG 2.8* 3.0* 2.6*   GFR: CrCl cannot be calculated (Unknown ideal weight.). Liver Function Tests: Recent Labs  Lab 12/17/19 1113 12/20/19 0654  AST 76* 103*  ALT 68* 83*  ALKPHOS 569* 455*  BILITOT 3.0* 4.2*  PROT 5.8* 5.3*  ALBUMIN 2.0* 2.1*   No results for input(s): LIPASE, AMYLASE in the last 168 hours. No results for input(s): AMMONIA in the last 168 hours. Coagulation Profile: Recent Labs  Lab 12/20/19 0654  INR 1.3*   Cardiac Enzymes: No results for input(s): CKTOTAL, CKMB, CKMBINDEX, TROPONINI in the last 168 hours. BNP (last 3 results) No results for input(s): PROBNP in the last 8760 hours. HbA1C: Recent Labs    12/21/19 0526  HGBA1C 5.9*   CBG: Recent Labs  Lab 12/20/19 1707 12/20/19 2216 12/21/19 0741 12/21/19 1138  GLUCAP 352* 306* 257* 212*   Lipid Profile: No results for input(s): CHOL, HDL, LDLCALC, TRIG, CHOLHDL, LDLDIRECT in the last 72 hours. Thyroid Function Tests: No results for input(s): TSH, T4TOTAL, FREET4, T3FREE, THYROIDAB in the last 72 hours. Anemia Panel: No results for input(s): VITAMINB12, FOLATE, FERRITIN, TIBC, IRON, RETICCTPCT in the last 72 hours. Sepsis Labs: No results for input(s): PROCALCITON, LATICACIDVEN in the last 168 hours.  Recent Results (from the past 240 hour(s))  Resp Panel by RT PCR (RSV, Flu A&B, Covid) -     Status: None    Collection Time: 12/22/2019  3:20 PM  Result Value Ref Range Status   SARS Coronavirus 2 by RT PCR NEGATIVE NEGATIVE Final    Comment: (NOTE) SARS-CoV-2 target nucleic acids are NOT DETECTED. The SARS-CoV-2 RNA is generally detectable in upper respiratoy specimens during the acute phase of infection. The lowest concentration of SARS-CoV-2 viral copies this assay can detect is 131 copies/mL. A negative result does not preclude SARS-Cov-2 infection and should not be used as the sole basis for treatment or other patient management decisions. A negative result may occur with  improper specimen collection/handling, submission of specimen other than nasopharyngeal swab, presence of viral mutation(s) within the areas targeted by this assay, and inadequate number of viral copies (<131 copies/mL). A negative result must be combined with clinical observations, patient history, and epidemiological information. The expected result is Negative. Fact Sheet for Patients:  PinkCheek.be Fact Sheet for Healthcare Providers:  GravelBags.it This test is not yet ap proved or cleared by the [place] FDA and  has been authorized for detection and/or diagnosis of SARS-CoV-2 by FDA under an Emergency Use Authorization (EUA). This EUA will remain  in effect (meaning this test can be used) for the duration of the COVID-19 declaration under Section 564(b)(1) of the Act, 21 U.S.C. section 360bbb-3(b)(1), unless the authorization is terminated or revoked  sooner.    Influenza A by PCR NEGATIVE NEGATIVE Final   Influenza B by PCR NEGATIVE NEGATIVE Final    Comment: (NOTE) The Xpert Xpress SARS-CoV-2/FLU/RSV assay is intended as an aid in  the diagnosis of influenza from Nasopharyngeal swab specimens and  should not be used as a sole basis for treatment. Nasal washings and  aspirates are unacceptable for Xpert Xpress SARS-CoV-2/FLU/RSV  testing. Fact  Sheet for Patients: PinkCheek.be Fact Sheet for Healthcare Providers: GravelBags.it This test is not yet approved or cleared by the [place] FDA and  has been authorized for detection and/or diagnosis of SARS-CoV-2 by  FDA under an Emergency Use Authorization (EUA). This EUA will remain  in effect (meaning this test can be used) for the duration of the  Covid-19 declaration under Section 564(b)(1) of the Act, 21  U.S.C. section 360bbb-3(b)(1), unless the authorization is  terminated or revoked.    Respiratory Syncytial Virus by PCR NEGATIVE NEGATIVE Final    Comment: (NOTE) Fact Sheet for Patients: PinkCheek.be Fact Sheet for Healthcare Providers: GravelBags.it This test is not yet approved or cleared by the [place] FDA and  has been authorized for detection and/or diagnosis of SARS-CoV-2 by  FDA under an Emergency Use Authorization (EUA). This EUA will remain  in effect (meaning this test can be used) for the duration of the  COVID-19 declaration under Section 564(b)(1) of the Act, 21 U.S.C.  section 360bbb-3(b)(1), unless the authorization is terminated or  revoked. Performed at [facility], [place] [address], [place], [place] [address]          Radiology Studies: No results found.      Scheduled Meds: . aspirin EC  81 mg Oral Daily  . Chlorhexidine Gluconate Cloth  6 each Topical Daily  . dexamethasone  4 mg Oral TID  . docusate sodium  100 mg Oral BID  . fenofibrate  54 mg Oral Daily  . ferrous sulfate  325 mg Oral Q breakfast  . insulin aspart  0-15 Units Subcutaneous TID WC  . insulin glargine  15 Units Subcutaneous Daily  . methocarbamol  750 mg Oral TID  . pantoprazole  40 mg Oral Daily  . polyethylene glycol  17 g Oral BID  . pravastatin  40 mg Oral Daily  . senna  1 tablet Oral BID   Continuous Infusions: .  sodium chloride 100 mL/hr at 12/21/19 0400     LOS: 2 days     Cordelia Poche, MD [facility] 12/21/2019, 12:20 PM  If 7PM-7AM, please contact night-coverage [email or web address]

## 2019-12-21 NOTE — Consult Note (Signed)
[name] [phone]   [phone]  INPATIENT CONSULT NOTE  Patient Care Team: Randa Evens as PCP - General (Physician Assistant)  Hematological/Oncological History #Metastatic Squamous Cell Cancer, Unclear Primary. Possible Squamous Cell Of Pancreatic Origin 1) 11/03/2019: presented to [name] after seeing his PCP the day prior. Noted to have lab abnormalities. CT Thoracic Spine/Lumbar shows lesion to T1 and compression fracture of T12. CT C/A/Pshowed4 x 5 cm ill-defined hypoechoic mass within the pancreatic tailandinnumerable low-density lesions throughout the liver noted and compatible with metastatic disease. Noted to have microcytic anemia and hypercalcemia to 13.8. 2) 11/05/2019: underwent US guided biopsy of liver lesion. Pathologyconfirmed metastatic squamous cell carcinoma. Requested EGD to confirm esophageal primary. 3) 11/21/2019: patient underwent EGD and sigmoidoscopy. No clear evidence of malignancy noted on these procedures. 4) 12/04/2019: PET CT scan showed mass in pancreas and liver, but no alternative sites of primary tumor.  5) 12/17/2019: Cycle 1 Day 1 of Carboplatin/Paclitaxel  6) 01/10/2020: Admitted to [facility] for worsening deconditioning.   HISTORY OF PRESENTING ILLNESS:  Kenneth Khan. 54 y.o. male with medical history significant for metastatic squamous cell cancer of unclear origin who was admitted for pain control and marked deconditioning. In the interim since his last visit he began chemotherapy on 12/17/2019.  On exam today [name] appears uncomfortable. He notes he is having abdominal pain and discomfort. He reports heavy symptoms of heartburn when attempting to eat or drink. He does not report any nausea or vomiting following treatment with his chemotherapy. Additionally he notes his back pain is more manageable today. The other issue he reports today is constipation. He notes he has not had a BM since  12/17/2019 ( the day of treatment). He noted his main concern today was his abdominal discomfort.   MEDICAL HISTORY:  Past Medical History:  Diagnosis Date  . Cancer ([place])   . Hypertension    ALLERGIES:  has No Known Allergies.  MEDICATIONS:  Current Facility-Administered Medications  Medication Dose Route Frequency Provider Last Rate Last Admin  . 0.9 %  sodium chloride infusion   Intravenous Continuous Mariel Aloe, MD 100 mL/hr at 12/21/19 0400 Rate Verify at 12/21/19 0400  . acetaminophen (TYLENOL) tablet 650 mg  650 mg Oral Q6H PRN Mariel Aloe, MD   650 mg at 12/20/19 2059   Or  . acetaminophen (TYLENOL) suppository 650 mg  650 mg Rectal Q6H PRN Mariel Aloe, MD      . aspirin EC tablet 81 mg  81 mg Oral Daily Mariel Aloe, MD   81 mg at 12/21/19 [id number]  . calcium carbonate (TUMS - dosed in mg elemental calcium) chewable tablet 200 mg of elemental calcium  1 tablet Oral TID WC PRN Mariel Aloe, MD   200 mg of elemental calcium at 12/21/19 0903  . Chlorhexidine Gluconate Cloth 2 % PADS 6 each  6 each Topical Daily Mariel Aloe, MD      . dexamethasone (DECADRON) tablet 4 mg  4 mg Oral TID Mariel Aloe, MD   4 mg at 12/21/19 [id number]  . docusate sodium (COLACE) capsule 100 mg  100 mg Oral BID Mariel Aloe, MD   100 mg at 12/21/19 [id number]  . fenofibrate tablet 54 mg  54 mg Oral Daily Mariel Aloe, MD   54 mg at 12/20/19 0954  . ferrous sulfate tablet 325 mg  325 mg Oral Q breakfast Mariel Aloe, MD  325 mg at 12/21/19 0903  . insulin aspart (novoLOG) injection 0-15 Units  0-15 Units Subcutaneous TID WC Mariel Aloe, MD   8 Units at 12/21/19 [phone]  . insulin glargine (LANTUS) injection 15 Units  15 Units Subcutaneous Daily Mariel Aloe, MD      . Melatonin TABS 6 mg  6 mg Oral QHS PRN [name], NP   6 mg at 12/20/19 2106  . methocarbamol (ROBAXIN) tablet 750 mg  750 mg Oral TID Mariel Aloe, MD   750 mg at 12/21/19 [id number]  . ondansetron (ZOFRAN)  tablet 8 mg  8 mg Oral Q8H PRN Mariel Aloe, MD      . oxyCODONE (Oxy IR/ROXICODONE) immediate release tablet 5-10 mg  5-10 mg Oral Q6H PRN Mariel Aloe, MD   10 mg at 12/21/19 0520  . pantoprazole (PROTONIX) injection 40 mg  40 mg Intravenous Q12H Narda Rutherford T IV, MD      . polyethylene glycol (MIRALAX / GLYCOLAX) packet 17 g  17 g Oral BID Mariel Aloe, MD      . pravastatin (PRAVACHOL) tablet 40 mg  40 mg Oral Daily Mariel Aloe, MD   40 mg at 12/21/19 0904  . prochlorperazine (COMPAZINE) tablet 10 mg  10 mg Oral Q6H PRN Mariel Aloe, MD      . senna (SENOKOT) tablet 8.6 mg  1 tablet Oral BID Mariel Aloe, MD   8.6 mg at 12/21/19 [id number]    Vitals:   12/20/19 2215 12/21/19 0541  BP: 115/73 124/80  Pulse: (!) 103 (!) 106  Resp: 18 18  Temp: (!) 97.3 F (36.3 C) (!) 97.3 F (36.3 C)  SpO2: 91% 92%   There were no vitals filed for this visit.  GENERAL: chronically ill appearing Caucasian male in NAD  SKIN: skin color, texture, turgor are normal, no rashes or significant lesions EYES: conjunctiva are pink and non-injected, sclera clear LUNGS: clear to auscultation and percussion with normal breathing effort HEART: regular rate & rhythm and no murmurs and no lower extremity edema ABDOMEN: soft mildly-distended, normal bowel sounds. Mild discomfort on palpation.  Musculoskeletal: no cyanosis of digits and no clubbing  PSYCH: alert & oriented x 3, fluent speech NEURO: no focal motor/sensory deficits  LABORATORY DATA:  I have reviewed the data as listed CBC Latest Ref Rng & Units 12/21/2019 12/20/2019 12/17/2019  WBC 4.0 - 10.5 K/uL 22.4(H) 25.7(H) 45.0(H)  Hemoglobin 13.0 - 17.0 g/dL 10.7(L) 11.6(L) 12.3(L)  Hematocrit 39.0 - 52.0 % 37.1(L) 39.5 42.2  Platelets 150 - 400 K/uL 139(L) 157 413(H)    CMP Latest Ref Rng & Units 12/21/2019 12/20/2019 12/17/2019  Glucose 70 - 99 mg/dL 288(H) 267(H) 220(H)  BUN 6 - 20 mg/dL 33(H) 35(H) 26(H)  Creatinine 0.61 - 1.24 mg/dL 0.64 0.73  0.78  Sodium 135 - 145 mmol/L 134(L) 134(L) 138  Potassium 3.5 - 5.1 mmol/L 4.7 4.4 4.1  Chloride 98 - 111 mmol/L 102 100 104  CO2 22 - 32 mmol/L 23 25 23   Calcium 8.9 - 10.3 mg/dL 7.9(L) 8.0(L) 9.8  Total Protein 6.5 - 8.1 g/dL - 5.3(L) 5.8(L)  Total Bilirubin 0.3 - 1.2 mg/dL - 4.2(H) 3.0(H)  Alkaline Phos 38 - 126 U/L - 455(H) 569(H)  AST 15 - 41 U/L - 103(H) 76(H)  ALT 0 - 44 U/L - 83(H) 68(H)     ASSESSMENT & PLAN Kenneth Khan. 54 y.o. male with medical history significant  for metastatic squamous cell cancer of unclear origin who was admitted for pain control and marked deconditioning. In the interim since his last visit he began chemotherapy on 12/17/2019. His chemotherapy regimen consists of Carbo/Paclitaxel.   # Deconditioning/Unsafe at Home --patient is no longer functionally ambulatory and requires complete assistance moving from vehicle to wheelchair and the restroom. --his fiancee can no longer safely care for him at home and our services are no longer able to lift him into his vehicle to go home.  --agree to being admitted for easy transport to and from radiation therapy and for receiving physical therapy. He will need to be in house until at least 12/28/2019. --plan for d/c to SNF or Acute rehab once radiation therapy is complete.  #Abdominal Pain/Heartburn --concern that his symptoms may be 2/2 to steroid therapy for his spinal lesion --recommend starting protonix 40mg  IV q12H --continue to monitor Hgb closely  --can consider d/c of steroids if symptoms do not improve with IV PPI.   #Iron Deficiency Anemia --drops in Hgb and iron levels thought to be 2/2 to tumor in the GI tract, though EGD/colonscopy saw no discrete lesions --continue PO iron therapy  #Leukocytosis --neutrophilic predominance, consistent with leukocytosis of malignancy and worsened by steroid use --held GCSF therapy in setting of elevated WBC --continue to monitor   #Metastatic Squamous Cell  Cancer, Uncertain Primary --started Cycle 1 of Carbo/Paclitaxel on 12/17/2019. GCSF shot held due to marked leukocytosis at time it was scheduled.  --last CT scan on 11/03/2019 with PET on 12/04/2019 Will plan for repeat CT scan in March 2020 --patient is tolerating the chemotherapy well so far with no symptoms of nausea/vomiting/fatigue.  --RTC prior to Cycle 2 of chemotherapy on 01/07/2020.   #Constipation --agree with bowel regimen per primary team   Appreciate the care of hospital medicine. Oncology will continue to follow while he is inpatient.   [name], MD Department of Hematology/Oncology [place] at [facility] Phone: [phone] Pager: [phone] Email: [email or web address]  12/21/2019 1:21 PM

## 2019-12-21 NOTE — Progress Notes (Addendum)
PT Cancellation Note  Patient Details Name: Kenneth Khan. MRN: [id number] DOB: 1966-10-12   Cancelled Treatment:    Reason Eval/Treat Not Completed: Pain limiting ability to participate Pt reporting stomach pain and with grimacing and writhing.  Notified RN.  Will f/u as able.  Checked back in afternoon and pt appeared in less pain but continues to report stomach and indigestion issues.  Declined PT.  Will f/u as able.   Maggie Font, PT Acute Rehab Services Pager [phone] [facility] 12/21/2019, 11:01 AM

## 2019-12-21 NOTE — Progress Notes (Signed)
[place] PHYSICAL MEDICINE AND REHABILITATION  CONSULT SERVICE NOTE   Chart reviewed. Have spoken to our admissions coordinators. 54 yo male with metastatic squamous cell cancer admitted on 2/3 from his oncologist d/t increased pain and difficulty physically transporting to XRT. He is presently on chemotherapy, and his radiation treatments are scheduled thru 2/12. By report he has been declining physically over the last few weeks. Pt has been up once with therapies since admit. He could not tolerate any activity today d/t severe abdominal pain. We have been asked to assess for potential CIR admit  At this point, his pain management needs to be a priority. He's on decadron and scheduled robaxin with oxycodone 5-10mg  q6 hours prn. He might benefit from higher doses of breakthrough medication as well as a scheduled opiate such as fentanyl to better control pain. I also feel that palliative care should be involved to assist in goals of care, pain mgt, etc. If his pain improves, and he still requires physical assist for self-care and mobility we could consider a brief inpatient rehab admission to help prepare family, set up equipment etc.   Rehab Admissions Coordinator to follow up.  Thanks,  Meredith Staggers, MD, [facility]

## 2019-12-21 NOTE — Progress Notes (Addendum)
OT Cancellation Note  Patient Details Name: Kenneth Khan. MRN: [id number] DOB: 06-30-1966   Cancelled Treatment:     Attempt to see patient x2 this morning however patient having significant abdominal discomfort and politely declines therapy. RN aware. Will re-attempt as schedule allows.  Attempted in PM however patient still not feeling well and would like to try again tomorrow.  [place] OT office: [place] 12/21/2019, 11:19 AM

## 2019-12-21 NOTE — TOC Initial Note (Signed)
Transition of Care (TOC) - Initial/Assessment Note    Patient Details  Name: Kenneth Khan. MRN: [id number] Date of Birth: 06-18-1966  Transition of Care [facility] CM/SW Contact:    Suhaylah Wampole, Marjie Skiff, RN Phone Number: 12/21/2019, 11:15 AM  Clinical Narrative:                 Pt from home with spouse. PT recommendations were for CIR. CIR liaison has suggested CIR MD eval. Will await CIR MD eval and assist with dc planning as needed.  Expected Discharge Plan: IP Rehab Facility Barriers to Discharge: Continued Medical Work up     Expected Discharge Plan and Services Expected Discharge Plan: [place]       Prior Living Arrangements/Services   Lives with:: Spouse                   Activities of Daily Living Home Assistive Devices/Equipment: Eyeglasses ADL Screening (condition at time of admission) Patient's cognitive ability adequate to safely complete daily activities?: Yes Is the patient deaf or have difficulty hearing?: No Does the patient have difficulty seeing, even when wearing glasses/contacts?: No Does the patient have difficulty concentrating, remembering, or making decisions?: No Patient able to express need for assistance with ADLs?: Yes Does the patient have difficulty dressing or bathing?: Yes Independently performs ADLs?: No Communication: Independent Dressing (OT): Needs assistance Is this a change from baseline?: Pre-admission baseline Grooming: Needs assistance Is this a change from baseline?: Pre-admission baseline Feeding: Needs assistance Is this a change from baseline?: Pre-admission baseline Bathing: Needs assistance Is this a change from baseline?: Pre-admission baseline Toileting: Dependent Is this a change from baseline?: Change from baseline, expected to last >3days In/Out Bed: Dependent Is this a change from baseline?: Change from baseline, expected to last >3 days Walks in Home: Dependent Is this a change from baseline?: Change  from baseline, expected to last >3 days Does the patient have difficulty walking or climbing stairs?: Yes(secondary to weakness) Weakness of Legs: Both Weakness of Arms/Hands: None   Admission diagnosis:  Ambulatory dysfunction [R26.2] Patient Active Problem List   Diagnosis Date Noted  . Ambulatory dysfunction 12/31/2019  . Port-A-Cath in place 12/06/2019  . Pancreatic mass 11/04/2019  . 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 Hypercalcemia 11/03/2019   PCP:  Nicholes Rough, PA-C Pharmacy:   [id number] - [place], [place]. AT [address] [place] [address] Phone: [phone] Fax: [phone]     Social Determinants of Health (SDOH) Interventions    Readmission Risk Interventions Readmission Risk Prevention Plan 12/21/2019  Transportation Screening Complete  PCP or Specialist Appt within 3-5 Days Complete  HRI or [place] Complete  Social Work Consult for [place] Planning/Counseling Complete  Palliative Care Screening Not Applicable  Medication Review [employer] photographer) Complete

## 2019-12-22 ENCOUNTER — Inpatient Hospital Stay: Payer: Managed Care, Other (non HMO)

## 2019-12-22 LAB — CBC
HCT: 35.9 % — ABNORMAL LOW (ref 39.0–52.0)
Hemoglobin: 10.6 g/dL — ABNORMAL LOW (ref 13.0–17.0)
MCH: 22.3 pg — ABNORMAL LOW (ref 26.0–34.0)
MCHC: 29.5 g/dL — ABNORMAL LOW (ref 30.0–36.0)
MCV: 75.4 fL — ABNORMAL LOW (ref 80.0–100.0)
Platelets: 93 10*3/uL — ABNORMAL LOW (ref 150–400)
RBC: 4.76 MIL/uL (ref 4.22–5.81)
RDW: 22.8 % — ABNORMAL HIGH (ref 11.5–15.5)
WBC: 4.4 10*3/uL (ref 4.0–10.5)
nRBC: 0 % (ref 0.0–0.2)

## 2019-12-22 LAB — GLUCOSE, CAPILLARY
Glucose-Capillary: 195 mg/dL — ABNORMAL HIGH (ref 70–99)
Glucose-Capillary: 207 mg/dL — ABNORMAL HIGH (ref 70–99)
Glucose-Capillary: 211 mg/dL — ABNORMAL HIGH (ref 70–99)
Glucose-Capillary: 239 mg/dL — ABNORMAL HIGH (ref 70–99)

## 2019-12-22 LAB — BASIC METABOLIC PANEL
Anion gap: 8 (ref 5–15)
BUN: 31 mg/dL — ABNORMAL HIGH (ref 6–20)
CO2: 22 mmol/L (ref 22–32)
Calcium: 7.8 mg/dL — ABNORMAL LOW (ref 8.9–10.3)
Chloride: 105 mmol/L (ref 98–111)
Creatinine, Ser: 0.57 mg/dL — ABNORMAL LOW (ref 0.61–1.24)
GFR calc Af Amer: >60 mL/min (ref >60)
GFR calc non Af Amer: >60 mL/min (ref >60)
Glucose, Bld: 232 mg/dL — ABNORMAL HIGH (ref 70–99)
Potassium: 4.4 mmol/L (ref 3.5–5.1)
Sodium: 135 mmol/L (ref 135–145)

## 2019-12-22 LAB — MAGNESIUM: Magnesium: 2.1 mg/dL (ref 1.7–2.4)

## 2019-12-22 NOTE — Progress Notes (Signed)
PROGRESS NOTE    Kenneth Khan.  [id number] DOB: [birth year] DOA: 01/11/2020 PCP: Nicholes Rough, PA-C   Brief Narrative: Kenneth Schwabe. is a 54 y.o. 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 Patient presented from his oncologists office secondary to severe functional disability with safety concerns for transport to/from XRT.   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  This has made it very difficult for the patient to safely be transported to and from his radiation therapy treatments. PT/OT recommending CIR. -XRT per radiation oncology  Metastatic squamous cell cancer Unknown primary. Currently on chemotherapy and radiation therapy. XRT is daily with end date planned for 2/12. Chemotherapy with FOLFOX started on 2/1 with one dose received to date per patient. Plan was for an infusion today but this was cancelled secondary to neutrophilia. CT scan mentions pancreatic tail lesion that has large contact surface with the splenic artery.   Back pain Pain is improved with narcotic regimen and radiation therapy.   Abdominal distension Abdominal x-ray suggests partial small bowel obstruction vs ileus. Patient passing gas and had bowel movements overnight. Favor ileus which may be improving. Likely complicated by opiates -Continue bowel regimen -Watch clinically  Leukocytosis Discussed with oncology. Likely related to cancer in addition to steroids. WBC improved. Neutrophilia improved. -Labs pending today  Elevated AST/ALT/Alkaline phosphatase Secondary to metastatic disease  Elevated BUN Likely some dehydration but also concern of possible chronic GI bleeding in setting  of metastatic disease. Hemoglobin stable. -Continue normal saline IV fluids -Labs pending today  Iron deficiency anemia Likely secondary to blood loss from active malignancy per oncology. -Continue iron supplementation  Hypermagnesemia Elevated with a magnesium of 3. Recently received Maalox/Mylanta but unsure if this contributed to increase. Trending down. -Hold magnesium containing treatments -IV fluids -Labs pending today  Hypertension Patient states he was taken off of antihypertensives. Currently normotensive.  Hyperglycemia Likely steroid induced. Hemoglobin A1C of 5.9% -SSI -Continue Lantus 15 units daily   DVT prophylaxis: SCDs Code Status:   Code Status: Full Code Family Communication: None at bedside Disposition Plan: Discharge pending XRT completion vs improvement in functional ability enough to allow safe transport to/from treatments   Consultants:   None  Procedures:   XRT  Antimicrobials:  None    Subjective: Continues to have indigestion. Improved with Protonix. Had multiple bowel movements last night.  Objective: Vitals:   12/21/19 0541 12/21/19 1420 12/21/19 2052 12/22/19 0454  BP: 124/80 122/84 113/82 113/78  Pulse: (!) 106 (!) 117 (!) 110 (!) 110  Resp: 18 20 16 17   Temp: (!) 97.3 F (36.3 C) 98 F (36.7 C) 97.6 F (36.4 C) 97.9 F (36.6 C)  TempSrc: Oral Oral Oral Oral  SpO2: 92% (!) 88% 92% 91%   No intake or output data in the 24 hours ending 12/22/19 0851 There were no vitals filed for this visit.  Examination:  General exam: Appears calm and comfortable Respiratory system: Clear to auscultation. Respiratory effort normal. Cardiovascular system: S1 & S2 heard, RRR. No murmurs, rubs, gallops or clicks. Gastrointestinal system: Abdomen is distended, soft and nontender. Normal bowel sounds heard. Central nervous system: Alert and oriented. No focal neurological deficits. Extremities: BLE edema. No calf tenderness  Skin: No  cyanosis. No rashes Psychiatry: Judgement and insight appear normal. Mood & affect appropriate.     Data Reviewed: I have personally reviewed following labs and imaging studies  CBC: Recent Labs  Lab 12/17/19 1113 12/20/19 0654 12/21/19 0527  WBC 45.0* 25.7* 22.4*  NEUTROABS 42.3* 25.0*  --   HGB 12.3* 11.6* 10.7*  HCT 42.2 39.5 37.1*  MCV 76.4* 76.0* 77.0*  PLT 413* 157 [id number]*   Basic Metabolic Panel: Recent Labs  Lab 12/17/19 1113 12/20/19 0654 12/21/19 0527  NA 138 134* 134*  K 4.1 4.4 4.7  CL 104 100 102  CO2 23 25 23   GLUCOSE 220* 267* 288*  BUN 26* 35* 33*  CREATININE 0.78 0.73 0.64  CALCIUM 9.8 8.0* 7.9*  MG 2.8* 3.0* 2.6*   GFR: CrCl cannot be calculated (Unknown ideal weight.). Liver Function Tests: Recent Labs  Lab 12/17/19 1113 12/20/19 0654  AST 76* 103*  ALT 68* 83*  ALKPHOS 569* 455*  BILITOT 3.0* 4.2*  PROT 5.8* 5.3*  ALBUMIN 2.0* 2.1*   No results for input(s): LIPASE, AMYLASE in the last 168 hours. No results for input(s): AMMONIA in the last 168 hours. Coagulation Profile: Recent Labs  Lab 12/20/19 0654  INR 1.3*   Cardiac Enzymes: No results for input(s): CKTOTAL, CKMB, CKMBINDEX, TROPONINI in the last 168 hours. BNP (last 3 results) No results for input(s): PROBNP in the last 8760 hours. HbA1C: Recent Labs    12/21/19 0526  HGBA1C 5.9*   CBG: Recent Labs  Lab 12/20/19 2216 12/21/19 0741 12/21/19 1138 12/21/19 1716 12/22/19 0740  GLUCAP 306* 257* 212* 236* 195*   Lipid Profile: No results for input(s): CHOL, HDL, LDLCALC, TRIG, CHOLHDL, LDLDIRECT in the last 72 hours. Thyroid Function Tests: No results for input(s): TSH, T4TOTAL, FREET4, T3FREE, THYROIDAB in the last 72 hours. Anemia Panel: No results for input(s): VITAMINB12, FOLATE, FERRITIN, TIBC, IRON, RETICCTPCT in the last 72 hours. Sepsis Labs: No results for input(s): PROCALCITON, LATICACIDVEN in the last 168 hours.  Recent Results (from the past 240  hour(s))  Resp Panel by RT PCR (RSV, Flu A&B, Covid) -     Status: None   Collection Time: 01/10/2020  3:20 PM  Result Value Ref Range Status   SARS Coronavirus 2 by RT PCR NEGATIVE NEGATIVE Final    Comment: (NOTE) SARS-CoV-2 target nucleic acids are NOT DETECTED. The SARS-CoV-2 RNA is generally detectable in upper respiratoy specimens during the acute phase of infection. The lowest concentration of SARS-CoV-2 viral copies this assay can detect is 131 copies/mL. A negative result does not preclude SARS-Cov-2 infection and should not be used as the sole basis for treatment or other patient management decisions. A negative result may occur with  improper specimen collection/handling, submission of specimen other than nasopharyngeal swab, presence of viral mutation(s) within the areas targeted by this assay, and inadequate number of viral copies (<131 copies/mL). A negative result must be combined with clinical observations, patient history, and epidemiological information. The expected result is Negative. Fact Sheet for Patients:  PinkCheek.be Fact Sheet for Healthcare Providers:  GravelBags.it This test is not yet ap proved or cleared by the [place] FDA and  has been authorized for detection and/or diagnosis of SARS-CoV-2 by FDA under an Emergency Use Authorization (EUA). This EUA will remain  in effect (meaning this test can be used) for the duration of the COVID-19 declaration under Section 564(b)(1) of the Act, 21 U.S.C. section 360bbb-3(b)(1), unless the authorization is terminated or  revoked sooner.    Influenza A by PCR NEGATIVE NEGATIVE Final   Influenza B by PCR NEGATIVE NEGATIVE Final    Comment: (NOTE) The Xpert Xpress SARS-CoV-2/FLU/RSV assay is intended as an aid in  the diagnosis of influenza from Nasopharyngeal swab specimens and  should not be used as a sole basis for treatment. Nasal washings and    aspirates are unacceptable for Xpert Xpress SARS-CoV-2/FLU/RSV  testing. Fact Sheet for Patients: PinkCheek.be Fact Sheet for Healthcare Providers: GravelBags.it This test is not yet approved or cleared by the [place] FDA and  has been authorized for detection and/or diagnosis of SARS-CoV-2 by  FDA under an Emergency Use Authorization (EUA). This EUA will remain  in effect (meaning this test can be used) for the duration of the  Covid-19 declaration under Section 564(b)(1) of the Act, 21  U.S.C. section 360bbb-3(b)(1), unless the authorization is  terminated or revoked.    Respiratory Syncytial Virus by PCR NEGATIVE NEGATIVE Final    Comment: (NOTE) Fact Sheet for Patients: PinkCheek.be Fact Sheet for Healthcare Providers: GravelBags.it This test is not yet approved or cleared by the [place] FDA and  has been authorized for detection and/or diagnosis of SARS-CoV-2 by  FDA under an Emergency Use Authorization (EUA). This EUA will remain  in effect (meaning this test can be used) for the duration of the  COVID-19 declaration under Section 564(b)(1) of the Act, 21 U.S.C.  section 360bbb-3(b)(1), unless the authorization is terminated or  revoked. Performed at [facility], [place] [address] [place], [place] [address]          Radiology Studies: DG Abd Portable 1V  Result Date: 12/21/2019 CLINICAL DATA:  Abdominal distension EXAM: PORTABLE ABDOMEN - 1 VIEW COMPARISON:  11/03/2019 FINDINGS: There are gas distended loops of large and small bowel throughout the abdomen. Stomach is also distended. Small to moderate volume of stool within the right hemicolon. Compression fracture of the T12 vertebral body without definite progression from prior. Pathologic nondisplaced fractures of the posterior and lateral aspects of the right eighth rib.  IMPRESSION: 1. Gas distended loops of large and small bowel throughout the abdomen. Findings most suggestive of ileus, although developing small bowel obstruction not excluded. 2. Pathologic nondisplaced fractures of the posterior and lateral aspects of the right eighth rib. The posterior fracture component appears acute or subacute. 3. Pathologic compression fracture of the T12 vertebral body, not significantly progressed compared to prior CT. Electronically Signed   By: Davina Poke D.O.   On: 12/21/2019 14:52        Scheduled Meds: . aspirin EC  81 mg Oral Daily  . Chlorhexidine Gluconate Cloth  6 each Topical Daily  . dexamethasone  4 mg Oral TID  . docusate sodium  100 mg Oral BID  . fenofibrate  54 mg Oral Daily  . ferrous sulfate  325 mg Oral Q breakfast  . insulin aspart  0-15 Units Subcutaneous TID WC  . insulin glargine  15 Units Subcutaneous Daily  . methocarbamol  750 mg Oral TID  . pantoprazole (PROTONIX) IV  40 mg Intravenous Q12H  . polyethylene glycol  17 g Oral BID  . pravastatin  40 mg Oral Daily  . senna  1 tablet Oral BID   Continuous Infusions: . sodium chloride 100 mL/hr at 12/21/19 1415     LOS: 3 days     Cordelia Poche, MD [facility] 12/22/2019, 8:51 AM  If 7PM-7AM, please contact night-coverage [email or web address]

## 2019-12-22 NOTE — Progress Notes (Addendum)
Physical Therapy Treatment Patient Details Name: Kenneth Khan. MRN: [id number] DOB: 1966/07/07 Today's Date: 12/22/2019    History of Present Illness Pt is 54 y.o. 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   Patient presents as a direct admission from his oncologist secondary to debility and difficulty with transport to and from radiation therapy treatments.  Pt admitted with ambulatory dysfunction.  MRI of T spine 11/04/20 revealed Metastatic lesions at T1, T12, and L1. There is moderate compressiondeformity of T12 with mild ventral epidural extension but no cordcompression. Additional diffusely abnormal marrow signal may reflecthematopoietic marrow given anemia or possibly more infiltrativemetastatic disease.    PT Comments    Pt in bed with freq loose stools and requesting to get OOB to "a real toilet".  General bed mobility comments: cued for log roll and use of bed rails.  Total Assist B LE.  T 1 Spinal Lesion with partial B LE paralysis/severe weakness L>R.  Pt unable to stand or walk.  General transfer comment: from elevated bed to partial stance < 20 seconds for back flaps to come down on steady.  Required increased assist this session.  Pillow between pt's knees and stedy for protection/comfort.  While seated in steady, moved to [facility].  Again, + 2 assist to partial stance long enough to remove stedy flaps and uncontrolled sit to [facility].  Pt sat on BSC nearly 15 min for a much needed BM.  Increased c/o weakness and fatigue with seated rest break on BSC.  Required Total Assist + 2 to rise from lower commode level to partial stance just enough to put stedy flaps down to be seated.  Moved to recliner.  Now his third standing attempt even more c/o weakness, assist to partial standing long enough to remove stedy flaps and uncontrolled sit to recliner.  Positioned up right to comfort.  ABD remains enlarged but pt  reports some relief with BM.  Reported all to RN and instructed to use Maxi Move to assist pt back to bed.  Follow Up Recommendations  CIR     Equipment Recommendations       Recommendations for Other Services Rehab consult     Precautions / Restrictions Precautions Precautions: Fall Precaution Comments: T 1 Spinal Lesion with partial B LE paralysis/severe weakness L>R Restrictions Weight Bearing Restrictions: No    Mobility  Bed Mobility Overal bed mobility: Needs Assistance Bed Mobility: Rolling;Sidelying to Sit Rolling: Max assist;+2 for physical assistance;+2 for safety/equipment Sidelying to sit: Max assist;Total assist;+2 for physical assistance;+2 for safety/equipment       General bed mobility comments: cued for log roll and use of bed rails.  Total Assist B LE  Transfers Overall transfer level: Needs assistance   Transfers: Sit to/from Stand Sit to Stand: +2 physical assistance;Max assist;Total assist         General transfer comment: from elevated bed to partial stance < 20 seconds for back flaps to come down on steady.  Required increased assist this session.  Pillow between pt's knees and stedy for protection/comfort.  While seated in steady, moved to [facility].  Again, + 2 assist to partial stance long enough to remove stedy flaps and uncontrolled sit to [facility].  Pt sat on BSC nearly 15 min for a much needed BM.  Increased c/o weakness and fatigue with seated rest break on BSC.  Required Total Assist + 2 to rise from lower commode level to partial stance  just enough to put stedy flaps down to be seated.  Moved to recliner.  Now his third standing attempt even more c/o weakness, assist to partial standing long enough to remove stedy flaps and uncontrolled sit to recliner.  Positioned up right to comfort.  ABD remains enlarged but pt reports some relief with BM.  Reported all to RN and instructed to use Maxi Move to assist pt back to bed.  Ambulation/Gait              General Gait Details: unable due to spinal leision   Stairs             Wheelchair Mobility    Modified Rankin (Stroke Patients Only)       Balance                                            Cognition Arousal/Alertness: Awake/alert Behavior During Therapy: WFL for tasks assessed/performed Overall Cognitive Status: Within Functional Limits for tasks assessed                                 General Comments: AxO x 4 feeling "really weak" and "Been sick for a while"      Exercises      General Comments        Pertinent Vitals/Pain Pain Assessment: Faces Faces Pain Scale: Hurts little more Pain Location: Abd Pain Descriptors / Indicators: Discomfort Pain Intervention(s): Monitored during session    Home Living                      Prior Function            PT Goals (current goals can now be found in the care plan section) Progress towards PT goals: Progressing toward goals    Frequency           PT Plan Current plan remains appropriate    Co-evaluation              AM-PAC PT "6 Clicks" Mobility   Outcome Measure  Help needed turning from your back to your side while in a flat bed without using bedrails?: Total Help needed moving from lying on your back to sitting on the side of a flat bed without using bedrails?: Total Help needed moving to and from a bed to a chair (including a wheelchair)?: Total Help needed standing up from a chair using your arms (e.g., wheelchair or bedside chair)?: Total Help needed to walk in hospital room?: Total Help needed climbing 3-5 steps with a railing? : Total 6 Click Score: 6    End of Session Equipment Utilized During Treatment: Gait belt Activity Tolerance: Patient limited by fatigue Patient left: in chair;with call bell/phone within reach;with family/visitor present Nurse Communication: Mobility status;Need for lift equipment PT Visit Diagnosis:  Unsteadiness on feet (R26.81);Other abnormalities of gait and mobility (R26.89);Muscle weakness (generalized) (M62.81)     Time: [id number] PT Time Calculation (min) (ACUTE ONLY): 40 min  Charges:  $Therapeutic Activity: 38-52 mins                     Rica Koyanagi  PTA Acute  Rehabilitation Services Pager      [phone] Office      [phone]

## 2019-12-23 ENCOUNTER — Inpatient Hospital Stay (HOSPITAL_COMMUNITY): Payer: Managed Care, Other (non HMO)

## 2019-12-23 LAB — DIFFERENTIAL
Abs Immature Granulocytes: 0.06 10*3/uL (ref 0.00–0.07)
Basophils Absolute: 0 10*3/uL (ref 0.0–0.1)
Basophils Relative: 0 %
Eosinophils Absolute: 0 10*3/uL (ref 0.0–0.5)
Eosinophils Relative: 3 %
Immature Granulocytes: 16 %
Lymphocytes Relative: 11 %
Lymphs Abs: 0 10*3/uL — ABNORMAL LOW (ref 0.7–4.0)
Monocytes Absolute: 0 10*3/uL — ABNORMAL LOW (ref 0.1–1.0)
Monocytes Relative: 3 %
Neutro Abs: 0.3 10*3/uL — ABNORMAL LOW (ref 1.7–7.7)
Neutrophils Relative %: 67 %

## 2019-12-23 LAB — GLUCOSE, CAPILLARY
Glucose-Capillary: 148 mg/dL — ABNORMAL HIGH (ref 70–99)
Glucose-Capillary: 256 mg/dL — ABNORMAL HIGH (ref 70–99)
Glucose-Capillary: 163 mg/dL — ABNORMAL HIGH (ref 70–99)
Glucose-Capillary: 112 mg/dL — ABNORMAL HIGH (ref 70–99)

## 2019-12-23 LAB — CBC
HCT: 36.9 % — ABNORMAL LOW (ref 39.0–52.0)
Hemoglobin: 10.8 g/dL — ABNORMAL LOW (ref 13.0–17.0)
MCH: 22 pg — ABNORMAL LOW (ref 26.0–34.0)
MCHC: 29.3 g/dL — ABNORMAL LOW (ref 30.0–36.0)
MCV: 75.3 fL — ABNORMAL LOW (ref 80.0–100.0)
Platelets: 69 10*3/uL — ABNORMAL LOW (ref 150–400)
RBC: 4.9 MIL/uL (ref 4.22–5.81)
RDW: 22.8 % — ABNORMAL HIGH (ref 11.5–15.5)
WBC: 0.5 10*3/uL — CL (ref 4.0–10.5)
nRBC: 0 % (ref 0.0–0.2)

## 2019-12-23 MED ORDER — DOCUSATE SODIUM 100 MG PO CAPS
100.0000 mg | ORAL_CAPSULE | Freq: Every day | ORAL | Status: DC
  Administered 2019-12-23: 100 mg via ORAL
  Filled 2019-12-23: qty 1

## 2019-12-23 MED ORDER — SENNA 8.6 MG PO TABS: 1.0000 | ORAL_TABLET | Freq: Every day | ORAL | Status: DC

## 2019-12-23 MED ORDER — SODIUM CHLORIDE 0.9 % IV SOLN: INTRAVENOUS | Status: DC

## 2019-12-23 MED ORDER — INSULIN GLARGINE 100 UNIT/ML ~~LOC~~ SOLN
20.0000 [IU] | Freq: Every day | SUBCUTANEOUS | Status: DC
  Administered 2019-12-23 – 2019-12-24 (×2): 20 [IU] via SUBCUTANEOUS
  Filled 2019-12-23 (×2): qty 0.2

## 2019-12-23 NOTE — Progress Notes (Signed)
CRITICAL VALUE ALERT  Critical Value:  WBC 0.5  Date & Time Notied:  12/23/19@0935   Provider Notified: Dr Cordelia Poche  Orders Received/Actions taken: yes

## 2019-12-23 NOTE — Evaluation (Signed)
Occupational Therapy Evaluation Patient Details Name: Kenneth Khan. MRN: [id number] DOB: May 08, 1966 Today's Date: 12/23/2019    History of Present Illness Pt is 54 y.o. 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   Patient presents as a direct admission from his oncologist secondary to debility and difficulty with transport to and from radiation therapy treatments.  Pt admitted with ambulatory dysfunction.  MRI of T spine 11/04/20 revealed Metastatic lesions at T1, T12, and L1. There is moderate compressiondeformity of T12 with mild ventral epidural extension but no cordcompression. Additional diffusely abnormal marrow signal may reflecthematopoietic marrow given anemia or possibly more infiltrativemetastatic disease.   Clinical Impression   Patient is a 54 year old male that lives with his fiancee in a two story home (can stay main level) and has ramp entrance. Up until ~2 weeks ago patient was able to ambulate with a walker and fiancee assisted with sponge bathing. Currently patient requires extensive assistance for all self care tasks. Patient was seated in recliner upon arrival, nursing staff utilized maxi lift. Patient required mod assist with use of B UE to pull himself forward in recliner to participate in grooming/hygiene tasks. Patient was min guard assist due to decreased trunk control requiring unilateral support leaning onto recliner arm rest while brushing his teeth. Patient fatigues after approximately 5 mins in upright sitting in recliner. Recommend continued acute OT services to maximize patient independence and safety with self care.    Follow Up Recommendations  CIR;Supervision/Assistance - 24 hour    Equipment Recommendations  Other (comment)(defer to next venue)       Precautions / Restrictions Precautions Precautions: Fall Precaution Comments: T 1 Spinal Lesion with partial B LE  paralysis/severe weakness L>R Restrictions Weight Bearing Restrictions: No      Mobility Bed Mobility               General bed mobility comments: seated in recliner upon arrival   Transfers Overall transfer level: Needs assistance               General transfer comment: seated in recliner upon arrival, nursing staff used maxi lift to perform. anticipate extensive assist x2 with lift equipment for transfers    Balance Overall balance assessment: Needs assistance Sitting-balance support: Feet supported;Single extremity supported Sitting balance-Leahy Scale: Poor Sitting balance - Comments: requires use of UE and leaning onto forearm to complete grooming/hygiene in recliner                                   ADL either performed or assessed with clinical judgement   ADL Overall ADL's : Needs assistance/impaired     Grooming: Min guard;Sitting;Wash/dry face;Oral care Grooming Details (indicate cue type and reason): decreased trunk control seated upright in recliner Upper Body Bathing: Moderate assistance;Sitting   Lower Body Bathing: Total assistance;Bed level   Upper Body Dressing : Moderate assistance;Sitting   Lower Body Dressing: Total assistance;Bed level   Toilet Transfer: +2 for physical assistance;+2 for safety/equipment;Total assistance Toilet Transfer Details (indicate cue type and reason): patient seated in recliner upon arrival, due to limited trunk and LE strength/control anticipate heavy assist x2 with use of lift equipment Toileting- Clothing Manipulation and Hygiene: Total assistance;Bed level         General ADL Comments: due to decreased core/LE strength, coordination, patient requires extensive assistance with ADLs and functional transfers  Pertinent Vitals/Pain Pain Assessment: Faces Faces Pain Scale: Hurts little more Pain Location: bilateral knees Pain Descriptors / Indicators: Discomfort Pain  Intervention(s): Repositioned;Monitored during session     Hand Dominance Right   Extremity/Trunk Assessment Upper Extremity Assessment Upper Extremity Assessment: Generalized weakness   Lower Extremity Assessment Lower Extremity Assessment: Defer to PT evaluation   Cervical / Trunk Assessment Cervical / Trunk Assessment: Kyphotic;Other exceptions Cervical / Trunk Exceptions: limited trunk control sitting upright in recliner requires use of hands/ leaning onto forearm during g/h tasks   Communication Communication Communication: No difficulties   Cognition Arousal/Alertness: Awake/alert Behavior During Therapy: WFL for tasks assessed/performed Overall Cognitive Status: Within Functional Limits for tasks assessed                                                Home Living Family/patient expects to be discharged to:: Private residence Living Arrangements: Spouse/significant other Available Help at Discharge: Family;Available PRN/intermittently Type of Home: House Home Access: Ramped entrance     Home Layout: Two level;Able to live on main level with bedroom/bathroom     Bathroom Shower/Tub: Occupational psychologist: Standard     Home Equipment: Bedside commode;Shower seat;Walker - 4 wheels;Wheelchair - manual          Prior Functioning/Environment Level of Independence: Needs assistance  Gait / Transfers Assistance Needed: Had been able to get to RW and household ambulation but reports gradual decline over past 2 weeks to the point of where strong friends were picking him up and putting him in a car. ADL's / Homemaking Assistance Needed: Reports fiancee assisting with sponge baths            OT Problem List: Decreased strength;Decreased activity tolerance;Impaired balance (sitting and/or standing);Decreased coordination;Decreased safety awareness;Decreased knowledge of use of DME or AE      OT Treatment/Interventions: Self-care/ADL  training;Therapeutic exercise;Neuromuscular education;Energy conservation;DME and/or AE instruction;Therapeutic activities;Patient/family education;Balance training    OT Goals(Current goals can be found in the care plan section) Acute Rehab OT Goals Patient Stated Goal: be able to walk/transfer OT Goal Formulation: With patient Time For Goal Achievement: 01/06/20 Potential to Achieve Goals: Good  OT Frequency: Min 2X/week    AM-PAC OT "6 Clicks" Daily Activity     Outcome Measure Help from another person eating meals?: A Little Help from another person taking care of personal grooming?: A Little Help from another person toileting, which includes using toliet, bedpan, or urinal?: Total Help from another person bathing (including washing, rinsing, drying)?: A Lot Help from another person to put on and taking off regular upper body clothing?: A Lot Help from another person to put on and taking off regular lower body clothing?: Total 6 Click Score: 12   End of Session Nurse Communication: Mobility status  Activity Tolerance: Patient limited by fatigue Patient left: in chair;with call bell/phone within reach  OT Visit Diagnosis: Other abnormalities of gait and mobility (R26.89);Muscle weakness (generalized) (M62.81)                Time: [id number] OT Time Calculation (min): 20 min Charges:  OT General Charges $OT Visit: 1 Visit OT Evaluation $OT Eval Moderate Complexity: [place] OT OT office: [place] 12/23/2019, 12:33 PM

## 2019-12-23 NOTE — Progress Notes (Addendum)
PROGRESS NOTE    Kenneth Khan.  [id number] DOB: [birth year] DOA: 12/29/2019 PCP: Nicholes Rough, PA-C   Brief Narrative: Kenneth Bass. is a 54 y.o. 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 Patient presented from his oncologists office secondary to severe functional disability with safety concerns for transport to/from XRT.   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  This has made it very difficult for the patient to safely be transported to and from his radiation therapy treatments. PT/OT recommending CIR. -XRT per radiation oncology  Metastatic squamous cell cancer Unknown primary. Currently on chemotherapy and radiation therapy. XRT is daily with end date planned for 2/12. Chemotherapy with Carboplatin/Paclitaxel started on 2/1 with one dose received to date per patient. Plan was for an infusion today but this was cancelled secondary to neutrophilia. CT scan mentions pancreatic tail lesion that has large contact surface with the splenic artery.   Back pain Pain is improved with narcotic regimen and radiation therapy.   Abdominal distension Abdominal x-ray suggests partial small bowel obstruction vs ileus. Patient passing gas and had bowel movements overnight. Favor ileus which may be improving. Likely complicated by opiates. Patient having multiple bowel movements -Will discontinue miralax, decrease colace and Senna to daily dosing -Watch clinically -Repeat abdominal x-ray today  Leukocytosis Discussed with oncology. Likely related to cancer in addition to steroids. WBC dramatically improved. Concerning with associated developed thrombocytopenia but hemoglobin is  currently stable. -Addendum: WBC of 0.5. Discussed with oncology. Since patient is not septic, recommend trending CBC for now. Differential pending. Repeat CBC with differential in AM  Elevated AST/ALT/Alkaline phosphatase Secondary to metastatic disease  Elevated BUN Likely some dehydration but also concern of possible chronic GI bleeding in setting of metastatic disease. Hemoglobin stable. -Continue normal saline IV fluids -Labs pending today  Iron deficiency anemia Likely secondary to blood loss from active malignancy per oncology. -Continue iron supplementation  Hypermagnesemia Elevated with a magnesium of 3. Recently received Maalox/Mylanta but unsure if this contributed to increase. Trending down. -Hold magnesium containing treatments -IV fluids -Labs pending today  Hypertension Patient states he was taken off of antihypertensives. Currently normotensive.  Hyperglycemia Likely steroid induced. Hemoglobin A1C of 5.9% -SSI -Increase to Lantus 20 units daily   DVT prophylaxis: SCDs Code Status:   Code Status: Full Code Family Communication: None at bedside Disposition Plan: Discharge pending XRT completion vs improvement in functional ability enough to allow safe transport to/from treatments   Consultants:   None  Procedures:   XRT  Antimicrobials:  None    Subjective: Multiple bowel movements. Epigastric pain improved.  Objective: Vitals:   12/22/19 2340 12/23/19 0349 12/23/19 0531 12/23/19 0748  BP: 110/79 112/82 110/83 109/74  Pulse: (!) 110 (!) 115 (!) 116 (!) 117  Resp: 18 20 20 16   Temp: (!) 97.4 F (36.3 C) (!) 97.5 F (36.4 C) (!) 97.4 F (36.3 C) (!) 97.5 F (36.4 C)  TempSrc: Oral Oral Oral Oral  SpO2: 94% 95% 95% 95%    Intake/Output Summary (Last 24 hours) at 12/23/2019 0915 Last data filed at 12/23/2019 0536 Gross per 24 hour  Intake 3533.7 ml  Output 1600 ml  Net 1933.7 ml   There were  no vitals filed for this  visit.  Examination:  General exam: Appears calm and comfortable Respiratory system: Clear to auscultation. Respiratory effort normal. Cardiovascular system: S1 & S2 heard, RRR. No murmurs, rubs, gallops or clicks. Gastrointestinal system: Abdomen is distended, soft and nontender. Decreased bowel sounds heard. Central nervous system: Alert and oriented. No focal neurological deficits. Extremities: Bilateral LE edema. No calf tenderness Skin: No cyanosis. No rashes Psychiatry: Judgement and insight appear normal. Mood & affect appropriate.    Data Reviewed: I have personally reviewed following labs and imaging studies  CBC: Recent Labs  Lab 12/17/19 1113 12/20/19 0654 12/21/19 0527 12/22/19 1144  WBC 45.0* 25.7* 22.4* 4.4  NEUTROABS 42.3* 25.0*  --   --   HGB 12.3* 11.6* 10.7* 10.6*  HCT 42.2 39.5 37.1* 35.9*  MCV 76.4* 76.0* 77.0* 75.4*  PLT 413* 157 139* 93*   Basic Metabolic Panel: Recent Labs  Lab 12/17/19 1113 12/20/19 0654 12/21/19 0527 12/22/19 1144  NA 138 134* 134* 135  K 4.1 4.4 4.7 4.4  CL 104 100 102 105  CO2 23 25 23 22   GLUCOSE 220* 267* 288* 232*  BUN 26* 35* 33* 31*  CREATININE 0.78 0.73 0.64 0.57*  CALCIUM 9.8 8.0* 7.9* 7.8*  MG 2.8* 3.0* 2.6* 2.1   GFR: CrCl cannot be calculated (Unknown ideal weight.). Liver Function Tests: Recent Labs  Lab 12/17/19 1113 12/20/19 0654  AST 76* 103*  ALT 68* 83*  ALKPHOS 569* 455*  BILITOT 3.0* 4.2*  PROT 5.8* 5.3*  ALBUMIN 2.0* 2.1*   No results for input(s): LIPASE, AMYLASE in the last 168 hours. No results for input(s): AMMONIA in the last 168 hours. Coagulation Profile: Recent Labs  Lab 12/20/19 0654  INR 1.3*   Cardiac Enzymes: No results for input(s): CKTOTAL, CKMB, CKMBINDEX, TROPONINI in the last 168 hours. BNP (last 3 results) No results for input(s): PROBNP in the last 8760 hours. HbA1C: Recent Labs    12/21/19 0526  HGBA1C 5.9*   CBG: Recent Labs  Lab 12/22/19 0740  12/22/19 1149 12/22/19 1643 12/22/19 2116 12/23/19 0752  GLUCAP 195* 207* 211* 239* 148*   Lipid Profile: No results for input(s): CHOL, HDL, LDLCALC, TRIG, CHOLHDL, LDLDIRECT in the last 72 hours. Thyroid Function Tests: No results for input(s): TSH, T4TOTAL, FREET4, T3FREE, THYROIDAB in the last 72 hours. Anemia Panel: No results for input(s): VITAMINB12, FOLATE, FERRITIN, TIBC, IRON, RETICCTPCT in the last 72 hours. Sepsis Labs: No results for input(s): PROCALCITON, LATICACIDVEN in the last 168 hours.  Recent Results (from the past 240 hour(s))  Resp Panel by RT PCR (RSV, Flu A&B, Covid) -     Status: None   Collection Time: 12/28/2019  3:20 PM  Result Value Ref Range Status   SARS Coronavirus 2 by RT PCR NEGATIVE NEGATIVE Final    Comment: (NOTE) SARS-CoV-2 target nucleic acids are NOT DETECTED. The SARS-CoV-2 RNA is generally detectable in upper respiratoy specimens during the acute phase of infection. The lowest concentration of SARS-CoV-2 viral copies this assay can detect is 131 copies/mL. A negative result does not preclude SARS-Cov-2 infection and should not be used as the sole basis for treatment or other patient management decisions. A negative result may occur with  improper specimen collection/handling, submission of specimen other than nasopharyngeal swab, presence of viral mutation(s) within the areas targeted by this assay, and inadequate number of viral copies (<131 copies/mL). A negative result must be combined with clinical observations, patient history, and epidemiological information. The expected  result is Negative. Fact Sheet for Patients:  PinkCheek.be Fact Sheet for Healthcare Providers:  GravelBags.it This test is not yet ap proved or cleared by the [place] FDA and  has been authorized for detection and/or diagnosis of SARS-CoV-2 by FDA under an Emergency Use Authorization (EUA). This EUA  will remain  in effect (meaning this test can be used) for the duration of the COVID-19 declaration under Section 564(b)(1) of the Act, 21 U.S.C. section 360bbb-3(b)(1), unless the authorization is terminated or revoked sooner.    Influenza A by PCR NEGATIVE NEGATIVE Final   Influenza B by PCR NEGATIVE NEGATIVE Final    Comment: (NOTE) The Xpert Xpress SARS-CoV-2/FLU/RSV assay is intended as an aid in  the diagnosis of influenza from Nasopharyngeal swab specimens and  should not be used as a sole basis for treatment. Nasal washings and  aspirates are unacceptable for Xpert Xpress SARS-CoV-2/FLU/RSV  testing. Fact Sheet for Patients: PinkCheek.be Fact Sheet for Healthcare Providers: GravelBags.it This test is not yet approved or cleared by the [place] FDA and  has been authorized for detection and/or diagnosis of SARS-CoV-2 by  FDA under an Emergency Use Authorization (EUA). This EUA will remain  in effect (meaning this test can be used) for the duration of the  Covid-19 declaration under Section 564(b)(1) of the Act, 21  U.S.C. section 360bbb-3(b)(1), unless the authorization is  terminated or revoked.    Respiratory Syncytial Virus by PCR NEGATIVE NEGATIVE Final    Comment: (NOTE) Fact Sheet for Patients: PinkCheek.be Fact Sheet for Healthcare Providers: GravelBags.it This test is not yet approved or cleared by the [place] FDA and  has been authorized for detection and/or diagnosis of SARS-CoV-2 by  FDA under an Emergency Use Authorization (EUA). This EUA will remain  in effect (meaning this test can be used) for the duration of the  COVID-19 declaration under Section 564(b)(1) of the Act, 21 U.S.C.  section 360bbb-3(b)(1), unless the authorization is terminated or  revoked. Performed at [facility], [place] [address] [place], [place] [address]          Radiology Studies: DG Abd Portable 1V  Result Date: 12/21/2019 CLINICAL DATA:  Abdominal distension EXAM: PORTABLE ABDOMEN - 1 VIEW COMPARISON:  11/03/2019 FINDINGS: There are gas distended loops of large and small bowel throughout the abdomen. Stomach is also distended. Small to moderate volume of stool within the right hemicolon. Compression fracture of the T12 vertebral body without definite progression from prior. Pathologic nondisplaced fractures of the posterior and lateral aspects of the right eighth rib. IMPRESSION: 1. Gas distended loops of large and small bowel throughout the abdomen. Findings most suggestive of ileus, although developing small bowel obstruction not excluded. 2. Pathologic nondisplaced fractures of the posterior and lateral aspects of the right eighth rib. The posterior fracture component appears acute or subacute. 3. Pathologic compression fracture of the T12 vertebral body, not significantly progressed compared to prior CT. Electronically Signed   By: Davina Poke D.O.   On: 12/21/2019 14:52        Scheduled Meds: . aspirin EC  81 mg Oral Daily  . Chlorhexidine Gluconate Cloth  6 each Topical Daily  . dexamethasone  4 mg Oral TID  . docusate sodium  100 mg Oral Daily  . fenofibrate  54 mg Oral Daily  . ferrous sulfate  325 mg Oral Q breakfast  . insulin aspart  0-15 Units Subcutaneous TID WC  . insulin glargine  20 Units Subcutaneous  Daily  . methocarbamol  750 mg Oral TID  . pantoprazole (PROTONIX) IV  40 mg Intravenous Q12H  . pravastatin  40 mg Oral Daily  . senna  1 tablet Oral QHS   Continuous Infusions:    LOS: 4 days     Cordelia Poche, MD [facility] 12/23/2019, 9:15 AM  If 7PM-7AM, please contact night-coverage [email or web address]

## 2019-12-24 ENCOUNTER — Ambulatory Visit: Payer: Managed Care, Other (non HMO)

## 2019-12-24 ENCOUNTER — Inpatient Hospital Stay: Payer: Managed Care, Other (non HMO)

## 2019-12-24 ENCOUNTER — Ambulatory Visit
Admission: RE | Admit: 2019-12-24 | Discharge: 2019-12-24 | Disposition: A | Discharge status: Home / Self Care | Payer: Managed Care, Other (non HMO) | Source: Ambulatory Visit | Attending: Radiation Oncology | Admitting: Radiation Oncology

## 2019-12-24 DIAGNOSIS — R197 Diarrhea, unspecified: Secondary | ICD-10-CM

## 2019-12-24 DIAGNOSIS — D696 Thrombocytopenia, unspecified: Secondary | ICD-10-CM

## 2019-12-24 DIAGNOSIS — D72819 Decreased white blood cell count, unspecified: Secondary | ICD-10-CM

## 2019-12-24 LAB — CBC WITH DIFFERENTIAL/PLATELET
HCT: 36.2 % — ABNORMAL LOW (ref 39.0–52.0)
Hemoglobin: 10.8 g/dL — ABNORMAL LOW (ref 13.0–17.0)
MCH: 22.2 pg — ABNORMAL LOW (ref 26.0–34.0)
MCHC: 29.8 g/dL — ABNORMAL LOW (ref 30.0–36.0)
MCV: 74.3 fL — ABNORMAL LOW (ref 80.0–100.0)
Platelets: 50 10*3/uL — ABNORMAL LOW (ref 150–400)
RBC: 4.87 MIL/uL (ref 4.22–5.81)
RDW: 22.7 % — ABNORMAL HIGH (ref 11.5–15.5)
WBC: 0.1 10*3/uL — CL (ref 4.0–10.5)
nRBC: 0 % (ref 0.0–0.2)

## 2019-12-24 LAB — GLUCOSE, CAPILLARY
Glucose-Capillary: 92 mg/dL (ref 70–99)
Glucose-Capillary: 71 mg/dL (ref 70–99)
Glucose-Capillary: 65 mg/dL — ABNORMAL LOW (ref 70–99)
Glucose-Capillary: 114 mg/dL — ABNORMAL HIGH (ref 70–99)
Glucose-Capillary: 89 mg/dL (ref 70–99)

## 2019-12-24 LAB — BASIC METABOLIC PANEL
Anion gap: 7 (ref 5–15)
BUN: 22 mg/dL — ABNORMAL HIGH (ref 6–20)
CO2: 19 mmol/L — ABNORMAL LOW (ref 22–32)
Calcium: 7.5 mg/dL — ABNORMAL LOW (ref 8.9–10.3)
Chloride: 107 mmol/L (ref 98–111)
Creatinine, Ser: 0.53 mg/dL — ABNORMAL LOW (ref 0.61–1.24)
GFR calc Af Amer: >60 mL/min (ref >60)
GFR calc non Af Amer: >60 mL/min (ref >60)
Glucose, Bld: 107 mg/dL — ABNORMAL HIGH (ref 70–99)
Potassium: 4.2 mmol/L (ref 3.5–5.1)
Sodium: 133 mmol/L — ABNORMAL LOW (ref 135–145)

## 2019-12-24 LAB — MAGNESIUM: Magnesium: 1.9 mg/dL (ref 1.7–2.4)

## 2019-12-24 MED ORDER — INSULIN GLARGINE 100 UNIT/ML ~~LOC~~ SOLN: 15.0000 [IU] | Freq: Every day | SUBCUTANEOUS | Status: DC

## 2019-12-24 MED ORDER — ALUM & MAG HYDROXIDE-SIMETH 200-200-20 MG/5ML PO SUSP
30.0000 mL | Freq: Four times a day (QID) | ORAL | Status: DC | PRN
  Administered 2019-12-30: 11:00:00 30 mL via ORAL
  Filled 2019-12-24: qty 30

## 2019-12-24 MED ORDER — LIDOCAINE VISCOUS HCL 2 % MT SOLN
15.0000 mL | Freq: Once | OROMUCOSAL | Status: AC
  Administered 2019-12-24: 15 mL via ORAL
  Filled 2019-12-24: qty 15

## 2019-12-24 MED ORDER — SODIUM CHLORIDE 0.9 % IV SOLN
8.0000 mg | Freq: Three times a day (TID) | INTRAVENOUS | Status: DC | PRN
  Administered 2019-12-24 – 2019-12-27 (×3): 8 mg via INTRAVENOUS
  Filled 2019-12-24 (×5): qty 4

## 2019-12-24 MED ORDER — ALUM & MAG HYDROXIDE-SIMETH 200-200-20 MG/5ML PO SUSP: 15.0000 mL | Freq: Four times a day (QID) | ORAL | Status: DC | PRN

## 2019-12-24 MED ORDER — LORAZEPAM 1 MG PO TABS
1.0000 mg | ORAL_TABLET | Freq: Once | ORAL | Status: AC
  Administered 2019-12-24: 1 mg via ORAL
  Filled 2019-12-24: qty 1

## 2019-12-24 NOTE — Progress Notes (Addendum)
PT Cancellation Note  Patient Details Name: Kenneth Khan. MRN: [id number] DOB: [birth year]   Cancelled Treatment:    Reason Eval/Treat Not Completed: Other (comment)   In morning: Pt with severe nausea.  RN aware and waiting on nausea meds for pt.  Will follow up as able.  Checked back in afternoon: Pt getting nausea meds, feels some better, but not well enough to attempt therapy.  Will follow up as able.   Maggie Font, PT Acute Rehab Services Pager [phone] [facility] [phone]   Karlton Lemon 12/24/2019, 1:51 PM

## 2019-12-24 NOTE — Progress Notes (Signed)
Patient was feeling nauseated this morning and early afternoon. The patient has been given PRN medication and states he does not feel like going to radiation today.

## 2019-12-24 NOTE — Progress Notes (Signed)
[place] [phone]   [phone]  INPATIENT CONSULT NOTE  Patient Care Team: Randa Evens as PCP - General (Physician Assistant)  Hematological/Oncological History #Metastatic Squamous Cell Cancer, Unclear Primary. Possible Squamous Cell Of Pancreatic Origin 1) 11/03/2019: presented to [name] after seeing his PCP the day prior. Noted to have lab abnormalities. CT Thoracic Spine/Lumbar shows lesion to T1 and compression fracture of T12. CT C/A/Pshowed4 x 5 cm ill-defined hypoechoic mass within the pancreatic tailandinnumerable low-density lesions throughout the liver noted and compatible with metastatic disease. Noted to have microcytic anemia and hypercalcemia to 13.8. 2) 11/05/2019: underwent US guided biopsy of liver lesion. Pathologyconfirmed metastatic squamous cell carcinoma. Requested EGD to confirm esophageal primary. 3) 11/21/2019: patient underwent EGD and sigmoidoscopy. No clear evidence of malignancy noted on these procedures. 4) 12/04/2019: PET CT scan showed mass in pancreas and liver, but no alternative sites of primary tumor.  5) 12/17/2019: Cycle 1 Day 1 of Carboplatin/Paclitaxel  6) 12/24/2019: Admitted to [name] for worsening functional status.   HISTORY OF PRESENTING ILLNESS:  Kenneth Khan. 54 y.o. male with medical history significant for metastatic squamous cell cancer of unclear origin who was admitted for pain control and marked deconditioning. In the interim since his last visit he began chemotherapy on 12/17/2019.  On exam today Kenneth Khan remains uncomfortable.  He reports that his constipation is resolved however now he unfortunately is having heavy bouts of diarrhea.  He continues to have extreme heartburn that has limited his ability for p.o. intake.  He reports that his back pain is under good control but his abdominal pain is of his greatest concern at the moment.  He denies having any fevers, chills,  sweats, or vomiting.  He is having some nausea with the heartburn.  His p.o. intake is poor but his fiance is at bedside helping him to eat applesauce.  A full 10 point ROS is listed below.  MEDICAL HISTORY:  Past Medical History:  Diagnosis Date  . Cancer ([place])   . Hypertension    ALLERGIES:  has No Known Allergies.  MEDICATIONS:  Current Facility-Administered Medications  Medication Dose Route Frequency Provider Last Rate Last Admin  . 0.9 %  sodium chloride infusion   Intravenous Continuous Mariel Aloe, MD 100 mL/hr at 12/24/19 1501 Rate Verify at 12/24/19 1501  . acetaminophen (TYLENOL) tablet 650 mg  650 mg Oral Q6H PRN Mariel Aloe, MD   650 mg at 12/20/19 2059   Or  . acetaminophen (TYLENOL) suppository 650 mg  650 mg Rectal Q6H PRN Mariel Aloe, MD      . alum & mag hydroxide-simeth (MAALOX/MYLANTA) 200-200-20 MG/5ML suspension 30 mL  30 mL Oral Q6H PRN Mariel Aloe, MD      . aspirin EC tablet 81 mg  81 mg Oral Daily Mariel Aloe, MD   81 mg at 12/24/19 0948  . calcium carbonate (TUMS - dosed in mg elemental calcium) chewable tablet 200 mg of elemental calcium  1 tablet Oral TID WC PRN Mariel Aloe, MD   200 mg of elemental calcium at 12/21/19 0903  . Chlorhexidine Gluconate Cloth 2 % PADS 6 each  6 each Topical Daily Mariel Aloe, MD   6 each at 12/24/19 [phone]  . dexamethasone (DECADRON) tablet 4 mg  4 mg Oral TID Mariel Aloe, MD   4 mg at 12/24/19 0948  . fenofibrate tablet 54 mg  54 mg Oral  Daily Mariel Aloe, MD   54 mg at 12/24/19 [id number]  . ferrous sulfate tablet 325 mg  325 mg Oral Q breakfast Mariel Aloe, MD   325 mg at 12/24/19 0948  . insulin aspart (novoLOG) injection 0-15 Units  0-15 Units Subcutaneous TID WC Mariel Aloe, MD   3 Units at 12/23/19 1704  . [START ON 12/25/2019] insulin glargine (LANTUS) injection 15 Units  15 Units Subcutaneous Daily Mariel Aloe, MD      . Melatonin TABS 6 mg  6 mg Oral QHS PRN [name], NP    6 mg at 12/20/19 2106  . methocarbamol (ROBAXIN) tablet 750 mg  750 mg Oral TID Mariel Aloe, MD   750 mg at 12/24/19 0948  . ondansetron (ZOFRAN) 8 mg in sodium chloride 0.9 % 50 mL IVPB  8 mg Intravenous Q8H PRN Mariel Aloe, MD   Stopped at 12/24/19 1254  . oxyCODONE (Oxy IR/ROXICODONE) immediate release tablet 5-10 mg  5-10 mg Oral Q6H PRN Mariel Aloe, MD   10 mg at 12/23/19 2008  . pantoprazole (PROTONIX) injection 40 mg  40 mg Intravenous Q12H [name] IV, MD   40 mg at 12/24/19 0948  . pravastatin (PRAVACHOL) tablet 40 mg  40 mg Oral Daily Mariel Aloe, MD   40 mg at 12/24/19 0948  . prochlorperazine (COMPAZINE) tablet 10 mg  10 mg Oral Q6H PRN Mariel Aloe, MD        Vitals:   12/24/19 0548 12/24/19 1354  BP: 112/79 101/73  Pulse: (!) 105 (!) 125  Resp: 16 19  Temp: 97.7 F (36.5 C) 98.3 F (36.8 C)  SpO2: 94% 95%   There were no vitals filed for this visit.  GENERAL: chronically ill appearing Caucasian male in NAD  SKIN: skin color, texture, turgor are normal, no rashes or significant lesions EYES: conjunctiva are pink and non-injected, sclera clear LUNGS: clear to auscultation and percussion with normal breathing effort HEART: regular rate & rhythm and no murmurs and no lower extremity edema ABDOMEN: soft mildly-distended, normal bowel sounds. Mild discomfort on palpation.  Musculoskeletal: no cyanosis of digits and no clubbing  PSYCH: alert & oriented x 3, fluent speech NEURO: no focal motor/sensory deficits  LABORATORY DATA:  I have reviewed the data as listed CBC Latest Ref Rng & Units 12/24/2019 12/23/2019 12/22/2019  WBC 4.0 - 10.5 K/uL 0.1(LL) 0.5(LL) 4.4  Hemoglobin 13.0 - 17.0 g/dL 10.8(L) 10.8(L) 10.6(L)  Hematocrit 39.0 - 52.0 % 36.2(L) 36.9(L) 35.9(L)  Platelets 150 - 400 K/uL 50(L) 69(L) 93(L)    CMP Latest Ref Rng & Units 12/24/2019 12/22/2019 12/21/2019  Glucose 70 - 99 mg/dL 107(H) 232(H) 288(H)  BUN 6 - 20 mg/dL 22(H) 31(H) 33(H)    Creatinine 0.61 - 1.24 mg/dL 0.53(L) 0.57(L) 0.64  Sodium 135 - 145 mmol/L 133(L) 135 134(L)  Potassium 3.5 - 5.1 mmol/L 4.2 4.4 4.7  Chloride 98 - 111 mmol/L 107 105 102  CO2 22 - 32 mmol/L 19(L) 22 23  Calcium 8.9 - 10.3 mg/dL 7.5(L) 7.8(L) 7.9(L)  Total Protein 6.5 - 8.1 g/dL - - -  Total Bilirubin 0.3 - 1.2 mg/dL - - -  Alkaline Phos 38 - 126 U/L - - -  AST 15 - 41 U/L - - -  ALT 0 - 44 U/L - - -     ASSESSMENT & PLAN Kenneth Khan. 54 y.o. male with medical history significant for  metastatic squamous cell cancer of unclear origin who was admitted for pain control and marked deconditioning. In the interim since his last visit he began chemotherapy on 12/17/2019. His chemotherapy regimen consists of Carbo/Paclitaxel.   # Deconditioning/Unsafe at Home --patient is no longer functionally ambulatory and requires complete assistance moving from vehicle to wheelchair and the restroom. --his fiancee can no longer safely care for him at home and our services are no longer able to lift him into his vehicle to go home.  --agree to being admitted for easy transport to and from radiation therapy and for receiving physical therapy. He will need to be in house until at least 12/28/2019. --plan for d/c to SNF or Acute rehab once radiation therapy is complete.  #Abdominal Pain/Heartburn --concern that his symptoms may be 2/2 to steroid therapy for his spinal lesion. Can discuss with Rad/Onc regarding taper.  --continue protonix 40mg  IV q12H --continue to monitor Hgb closely   #Iron Deficiency Anemia --drops in Hgb and iron levels thought to be 2/2 to tumor in the GI tract, though EGD/colonscopy saw no discrete lesions --continue PO iron therapy  #Leukocytosis, resolved.  --neutrophilic predominance, consistent with leukocytosis of malignancy and worsened by steroid use --held GCSF therapy in setting of elevated WBC --continue to monitor   #Leukopenia #Thrombocytopenia --these represent  chemotherapy induced cytopenias. No indication for GCSF now, though will consider addition for next cycle.  --continue to monitor with daily CBC. Transfuse for Plt <10  #Metastatic Squamous Cell Cancer, Uncertain Primary --started Cycle 1 of Carbo/Paclitaxel on 12/17/2019. GCSF shot held due to marked leukocytosis at time it was scheduled.  --last CT scan on 11/03/2019 with PET on 12/04/2019 Will plan for repeat CT scan in March 2020 --patient is tolerating the chemotherapy well so far with no symptoms of nausea/vomiting/fatigue.  --RTC prior to Cycle 2 of chemotherapy on 01/07/2020.   #Constipation, resolved #Diarrhea --agree with holding bowel regimen per primary team  Appreciate the care of hospital medicine. Oncology will continue to follow while he is inpatient.   [name], MD Department of Hematology/Oncology [place] at [facility] Phone: [phone] Pager: [phone] Email: [email or web address]  12/24/2019 6:13 PM

## 2019-12-24 NOTE — Progress Notes (Addendum)
PROGRESS NOTE    Kenneth Khan.  [id number] DOB: 06/07/1966 DOA: 12/23/2019 PCP: Nicholes Rough, PA-C   Brief Narrative: Kenneth Piercefield. is a 53 y.o. 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 Patient presented from his oncologists office secondary to severe functional disability with safety concerns for transport to/from XRT.   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  This has made it very difficult for the patient to safely be transported to and from his radiation therapy treatments. PT/OT recommending CIR. -XRT per radiation oncology  Metastatic squamous cell cancer Unknown primary. Currently on chemotherapy and radiation therapy. XRT is daily with end date planned for 2/12. Chemotherapy with Carboplatin/Paclitaxel started on 2/1 with one dose received to date per patient. Plan was for an infusion today but this was cancelled secondary to neutrophilia. CT scan mentions pancreatic tail lesion that has large contact surface with the splenic artery.   Back pain Pain is improved with narcotic regimen and radiation therapy.   Abdominal distension Initial abdominal x-ray suggested partial small bowel obstruction vs ileus. Patient passing gas and is having watery stools. Favor ileus which may be improving but some of this is likely related to chemotherapy. Repeat abdominal x-ray is significant for likely ileus. Likely complicated by opiates. Patient vomiting, but favor related to chemotherapy rather than worsening ileus/SBO. -Stopped MiraLAX, Senna, Colace -If continues to vomit, will repeat bedside abdominal x-ray  Leukopenia Neutropenia Likely secondary to chemotherapy. No  fevers overnight. Discussed with oncology on 2/7 and since afebrile, no role for Granix/Neupagen -Oncology recommendations today -Neutropenic precautions  Thrombocytopenia Likely secondary to chemotherapy. No evidence of bleeding at this time. -Oncology recommendations  Leukocytosis Discussed with oncology. Likely related to cancer in addition to steroids. Resolved.  Elevated AST/ALT/Alkaline phosphatase Secondary to metastatic disease  Elevated BUN Likely some dehydration but also concern of possible chronic GI bleeding in setting of metastatic disease. Hemoglobin stable. Improved with fluids.  Iron deficiency anemia Likely secondary to blood loss from active malignancy per oncology. -Continue iron supplementation  Hypermagnesemia Elevated with a magnesium of 3. Recently received Maalox/Mylanta but unsure if this contributed to increase. Resolved  Hypertension Patient states he was taken off of antihypertensives. Currently normotensive.  Hyperglycemia Likely steroid induced. Hemoglobin A1C of 5.9% -SSI -Decrease to Lantus 15 units daily   DVT prophylaxis: SCDs Code Status:   Code Status: Full Code Family Communication: None at bedside Disposition Plan: Discharge pending XRT completion vs improvement in functional ability enough to allow safe transport to/from treatments   Consultants:   None  Procedures:   XRT  Antimicrobials:  None    Subjective: Epigastric pain still persistent. Still with diarrhea  Objective: Vitals:   12/23/19 1821 12/23/19 2136 12/24/19 0227 12/24/19 0548  BP: 111/79 105/72 118/79 112/79  Pulse: (!) 105 (!) 108 (!) 110 (!) 105  Resp:  18 16 16   Temp: 98.9 F (37.2 C) (!) 97.3 F (36.3 C) (!) 97.5 F (36.4 C) 97.7 F (36.5 C)  TempSrc:  Oral Oral Axillary  SpO2: 100% 95% 96% 94%    Intake/Output Summary (Last 24 hours) at 12/24/2019 1149 Last data filed at 12/24/2019 0840 Gross per 24 hour  Intake --  Output 1000  ml   Net -1000 ml   There were no vitals filed for this visit.  Examination:  General exam: Appears calm and comfortable Respiratory system: Clear to auscultation. Respiratory effort normal. Cardiovascular system: S1 & S2 heard, RRR. No murmurs, rubs, gallops or clicks. Gastrointestinal system: Abdomen is distended, soft and nontender. Decreased bowel sounds heard. Central nervous system: Alert and oriented. No focal neurological deficits. Extremities: LE edema. No calf tenderness Skin: No cyanosis. No rashes Psychiatry: Judgement and insight appear normal. Mood & affect appropriate.    Data Reviewed: I have personally reviewed following labs and imaging studies  CBC: Recent Labs  Lab 12/20/19 0654 12/21/19 0527 12/22/19 1144 12/23/19 0500 12/24/19 0710  WBC 25.7* 22.4* 4.4 0.5* 0.1*  NEUTROABS 25.0*  --   --  0.3*  --   HGB 11.6* 10.7* 10.6* 10.8* 10.8*  HCT 39.5 37.1* 35.9* 36.9* 36.2*  MCV 76.0* 77.0* 75.4* 75.3* 74.3*  PLT 157 139* 93* 69* 50*   Basic Metabolic Panel: Recent Labs  Lab 12/20/19 0654 12/21/19 0527 12/22/19 1144 12/24/19 0710  NA 134* 134* 135 133*  K 4.4 4.7 4.4 4.2  CL 100 102 105 107  CO2 25 23 22  19*  GLUCOSE 267* 288* 232* 107*  BUN 35* 33* 31* 22*  CREATININE 0.73 0.64 0.57* 0.53*  CALCIUM 8.0* 7.9* 7.8* 7.5*  MG 3.0* 2.6* 2.1 1.9   GFR: CrCl cannot be calculated (Unknown ideal weight.). Liver Function Tests: Recent Labs  Lab 12/20/19 0654  AST 103*  ALT 83*  ALKPHOS 455*  BILITOT 4.2*  PROT 5.3*  ALBUMIN 2.1*   No results for input(s): LIPASE, AMYLASE in the last 168 hours. No results for input(s): AMMONIA in the last 168 hours. Coagulation Profile: Recent Labs  Lab 12/20/19 0654  INR 1.3*   Cardiac Enzymes: No results for input(s): CKTOTAL, CKMB, CKMBINDEX, TROPONINI in the last 168 hours. BNP (last 3 results) No results for input(s): PROBNP in the last 8760 hours. HbA1C: No results for input(s): HGBA1C in the last 72  hours. CBG: Recent Labs  Lab 12/23/19 0752 12/23/19 1154 12/23/19 1644 12/23/19 2134 12/24/19 0801  GLUCAP 148* 256* 163* 112* 92   Lipid Profile: No results for input(s): CHOL, HDL, LDLCALC, TRIG, CHOLHDL, LDLDIRECT in the last 72 hours. Thyroid Function Tests: No results for input(s): TSH, T4TOTAL, FREET4, T3FREE, THYROIDAB in the last 72 hours. Anemia Panel: No results for input(s): VITAMINB12, FOLATE, FERRITIN, TIBC, IRON, RETICCTPCT in the last 72 hours. Sepsis Labs: No results for input(s): PROCALCITON, LATICACIDVEN in the last 168 hours.  Recent Results (from the past 240 hour(s))  Resp Panel by RT PCR (RSV, Flu A&B, Covid) -     Status: None   Collection Time: 12/29/2019  3:20 PM  Result Value Ref Range Status   SARS Coronavirus 2 by RT PCR NEGATIVE NEGATIVE Final    Comment: (NOTE) SARS-CoV-2 target nucleic acids are NOT DETECTED. The SARS-CoV-2 RNA is generally detectable in upper respiratoy specimens during the acute phase of infection. The lowest concentration of SARS-CoV-2 viral copies this assay can detect is 131 copies/mL. A negative result does not preclude SARS-Cov-2 infection and should not be used as the sole basis for treatment or other patient management decisions. A negative result may occur with  improper specimen collection/handling, submission of specimen other than nasopharyngeal swab, presence of viral mutation(s) within the areas targeted by this assay, and inadequate number of viral copies (<131 copies/mL). A negative result must be combined with clinical  observations, patient history, and epidemiological information. The expected result is Negative. Fact Sheet for Patients:  PinkCheek.be Fact Sheet for Healthcare Providers:  GravelBags.it This test is not yet ap proved or cleared by the [place] FDA and  has been authorized for detection and/or diagnosis of SARS-CoV-2 by FDA under an  Emergency Use Authorization (EUA). This EUA will remain  in effect (meaning this test can be used) for the duration of the COVID-19 declaration under Section 564(b)(1) of the Act, 21 U.S.C. section 360bbb-3(b)(1), unless the authorization is terminated or revoked sooner.    Influenza A by PCR NEGATIVE NEGATIVE Final   Influenza B by PCR NEGATIVE NEGATIVE Final    Comment: (NOTE) The Xpert Xpress SARS-CoV-2/FLU/RSV assay is intended as an aid in  the diagnosis of influenza from Nasopharyngeal swab specimens and  should not be used as a sole basis for treatment. Nasal washings and  aspirates are unacceptable for Xpert Xpress SARS-CoV-2/FLU/RSV  testing. Fact Sheet for Patients: PinkCheek.be Fact Sheet for Healthcare Providers: GravelBags.it This test is not yet approved or cleared by the [place] FDA and  has been authorized for detection and/or diagnosis of SARS-CoV-2 by  FDA under an Emergency Use Authorization (EUA). This EUA will remain  in effect (meaning this test can be used) for the duration of the  Covid-19 declaration under Section 564(b)(1) of the Act, 21  U.S.C. section 360bbb-3(b)(1), unless the authorization is  terminated or revoked.    Respiratory Syncytial Virus by PCR NEGATIVE NEGATIVE Final    Comment: (NOTE) Fact Sheet for Patients: PinkCheek.be Fact Sheet for Healthcare Providers: GravelBags.it This test is not yet approved or cleared by the [place] FDA and  has been authorized for detection and/or diagnosis of SARS-CoV-2 by  FDA under an Emergency Use Authorization (EUA). This EUA will remain  in effect (meaning this test can be used) for the duration of the  COVID-19 declaration under Section 564(b)(1) of the Act, 21 U.S.C.  section 360bbb-3(b)(1), unless the authorization is terminated or  revoked. Performed at [facility], [place] [address] [place], [place] [address]          Radiology Studies: DG Abd Portable 1V  Result Date: 12/23/2019 CLINICAL DATA:  Abdominal distension. EXAM: PORTABLE ABDOMEN - 1 VIEW COMPARISON:  December 21, 2019. FINDINGS: No colonic dilatation is noted. Stable mild small bowel dilatation is noted most consistent with ileus. No radio-opaque calculi or other significant radiographic abnormality are seen. IMPRESSION: Stable mild small bowel dilatation is noted most consistent with ileus. Electronically Signed   By: Marijo Conception M.D.   On: 12/23/2019 09:38        Scheduled Meds: . aspirin EC  81 mg Oral Daily  . Chlorhexidine Gluconate Cloth  6 each Topical Daily  . dexamethasone  4 mg Oral TID  . fenofibrate  54 mg Oral Daily  . ferrous sulfate  325 mg Oral Q breakfast  . insulin aspart  0-15 Units Subcutaneous TID WC  . insulin glargine  20 Units Subcutaneous Daily  . methocarbamol  750 mg Oral TID  . pantoprazole (PROTONIX) IV  40 mg Intravenous Q12H  . pravastatin  40 mg Oral Daily   Continuous Infusions: . sodium chloride 100 mL/hr at 12/24/19 0956  . ondansetron (ZOFRAN) IV       LOS: 5 days     Cordelia Poche, MD [facility] 12/24/2019, 11:49 AM  If 7PM-7AM, please contact night-coverage [email or web address]

## 2019-12-24 NOTE — Progress Notes (Signed)
Hypoglycemic Event  CBG: 65  Treatment: 4 oz of juice Symptoms: N/A  Follow-up CBG: Time: 1810 CBG Result: 114  Possible Reasons for Event: decreased oral intake   Comments/MD notified: Gardenia Phlegm, MD    Kenneth Khan

## 2019-12-25 ENCOUNTER — Inpatient Hospital Stay (HOSPITAL_COMMUNITY): Payer: Managed Care, Other (non HMO)

## 2019-12-25 ENCOUNTER — Ambulatory Visit
Admission: RE | Admit: 2019-12-25 | Discharge: 2019-12-25 | Disposition: A | Discharge status: Home / Self Care | Payer: Managed Care, Other (non HMO) | Source: Ambulatory Visit | Attending: Radiation Oncology | Admitting: Radiation Oncology

## 2019-12-25 ENCOUNTER — Ambulatory Visit: Payer: Managed Care, Other (non HMO)

## 2019-12-25 DIAGNOSIS — L8990 Pressure ulcer of unspecified site, unspecified stage: Secondary | ICD-10-CM | POA: Insufficient documentation

## 2019-12-25 DIAGNOSIS — C7951 Secondary malignant neoplasm of bone: Secondary | ICD-10-CM

## 2019-12-25 DIAGNOSIS — I9589 Other hypotension: Secondary | ICD-10-CM

## 2019-12-25 DIAGNOSIS — E861 Hypovolemia: Secondary | ICD-10-CM

## 2019-12-25 LAB — RENAL FUNCTION PANEL
Albumin: 1.5 g/dL — ABNORMAL LOW (ref 3.5–5.0)
Anion gap: 7 (ref 5–15)
BUN: 20 mg/dL (ref 6–20)
CO2: 19 mmol/L — ABNORMAL LOW (ref 22–32)
Calcium: 7.2 mg/dL — ABNORMAL LOW (ref 8.9–10.3)
Chloride: 108 mmol/L (ref 98–111)
Creatinine, Ser: 0.51 mg/dL — ABNORMAL LOW (ref 0.61–1.24)
GFR calc Af Amer: >60 mL/min (ref >60)
GFR calc non Af Amer: >60 mL/min (ref >60)
Glucose, Bld: 110 mg/dL — ABNORMAL HIGH (ref 70–99)
Phosphorus: 2.5 mg/dL (ref 2.5–4.6)
Potassium: 4.2 mmol/L (ref 3.5–5.1)
Sodium: 134 mmol/L — ABNORMAL LOW (ref 135–145)

## 2019-12-25 LAB — GLUCOSE, CAPILLARY
Glucose-Capillary: 64 mg/dL — ABNORMAL LOW (ref 70–99)
Glucose-Capillary: 67 mg/dL — ABNORMAL LOW (ref 70–99)
Glucose-Capillary: 78 mg/dL (ref 70–99)
Glucose-Capillary: 91 mg/dL (ref 70–99)
Glucose-Capillary: 61 mg/dL — ABNORMAL LOW (ref 70–99)
Glucose-Capillary: 81 mg/dL (ref 70–99)
Glucose-Capillary: 88 mg/dL (ref 70–99)

## 2019-12-25 LAB — PROCALCITONIN: Procalcitonin: 2.14 ng/mL

## 2019-12-25 LAB — CBC
HCT: 33.2 % — ABNORMAL LOW (ref 39.0–52.0)
Hemoglobin: 10.2 g/dL — ABNORMAL LOW (ref 13.0–17.0)
MCH: 23 pg — ABNORMAL LOW (ref 26.0–34.0)
MCHC: 30.7 g/dL (ref 30.0–36.0)
MCV: 74.8 fL — ABNORMAL LOW (ref 80.0–100.0)
Platelets: 25 10*3/uL — CL (ref 150–400)
RBC: 4.44 MIL/uL (ref 4.22–5.81)
RDW: 22.7 % — ABNORMAL HIGH (ref 11.5–15.5)
WBC: 0.1 10*3/uL — CL (ref 4.0–10.5)
nRBC: 0 % (ref 0.0–0.2)

## 2019-12-25 LAB — BRAIN NATRIURETIC PEPTIDE: B Natriuretic Peptide: 36.7 pg/mL (ref 0.0–100.0)

## 2019-12-25 LAB — LACTIC ACID, PLASMA
Lactic Acid, Venous: 1.9 mmol/L (ref 0.5–1.9)
Lactic Acid, Venous: 1.8 mmol/L (ref 0.5–1.9)

## 2019-12-25 LAB — MRSA PCR SCREENING: MRSA by PCR: NEGATIVE

## 2019-12-25 MED ORDER — SODIUM CHLORIDE 0.9% FLUSH
10.0000 mL | INTRAVENOUS | Status: DC | PRN
  Administered 2019-12-25: 10 mL

## 2019-12-25 MED ORDER — METHYLPREDNISOLONE SODIUM SUCC 125 MG IJ SOLR
60.0000 mg | Freq: Two times a day (BID) | INTRAMUSCULAR | Status: DC
  Administered 2019-12-25: 60 mg via INTRAVENOUS
  Filled 2019-12-25: qty 2

## 2019-12-25 MED ORDER — VANCOMYCIN HCL 1500 MG/300ML IV SOLN
1500.0000 mg | Freq: Two times a day (BID) | INTRAVENOUS | Status: DC
  Administered 2019-12-26: 1500 mg via INTRAVENOUS
  Filled 2019-12-25: qty 300

## 2019-12-25 MED ORDER — SODIUM CHLORIDE 0.9% FLUSH
10.0000 mL | Freq: Two times a day (BID) | INTRAVENOUS | Status: DC
  Administered 2019-12-25 – 2020-01-02 (×15): 10 mL

## 2019-12-25 MED ORDER — ALBUMIN HUMAN 5 % IV SOLN
12.5000 g | INTRAVENOUS | Status: AC
  Administered 2019-12-25 (×2): 12.5 g via INTRAVENOUS
  Filled 2019-12-25 (×2): qty 250

## 2019-12-25 MED ORDER — LIP MEDEX EX OINT
TOPICAL_OINTMENT | CUTANEOUS | Status: DC | PRN
  Filled 2019-12-25: qty 7

## 2019-12-25 MED ORDER — SODIUM CHLORIDE 0.9 % IV SOLN
2.0000 g | Freq: Three times a day (TID) | INTRAVENOUS | Status: DC
  Administered 2019-12-25 – 2020-01-02 (×23): 2 g via INTRAVENOUS
  Filled 2019-12-25 (×24): qty 2

## 2019-12-25 MED ORDER — LORAZEPAM 1 MG PO TABS
1.0000 mg | ORAL_TABLET | Freq: Four times a day (QID) | ORAL | Status: DC | PRN
  Administered 2019-12-25: 1 mg via ORAL
  Filled 2019-12-25: qty 1

## 2019-12-25 MED ORDER — LORAZEPAM 0.5 MG PO TABS
0.5000 mg | ORAL_TABLET | Freq: Four times a day (QID) | ORAL | Status: DC | PRN
  Administered 2019-12-28 – 2020-01-01 (×2): 0.5 mg via ORAL
  Filled 2019-12-25 (×2): qty 1

## 2019-12-25 MED ORDER — INSULIN ASPART 100 UNIT/ML ~~LOC~~ SOLN
0.0000 [IU] | SUBCUTANEOUS | Status: DC
  Administered 2019-12-27 (×2): 1 [IU] via SUBCUTANEOUS
  Administered 2019-12-27: 2 [IU] via SUBCUTANEOUS
  Administered 2019-12-27 – 2019-12-28 (×4): 1 [IU] via SUBCUTANEOUS
  Administered 2019-12-28 (×2): 2 [IU] via SUBCUTANEOUS
  Administered 2019-12-28: 3 [IU] via SUBCUTANEOUS
  Administered 2019-12-29: 1 [IU] via SUBCUTANEOUS
  Administered 2019-12-29 (×2): 2 [IU] via SUBCUTANEOUS
  Administered 2019-12-29 (×2): 1 [IU] via SUBCUTANEOUS
  Administered 2019-12-29 – 2019-12-30 (×2): 2 [IU] via SUBCUTANEOUS
  Administered 2019-12-30: 1 [IU] via SUBCUTANEOUS

## 2019-12-25 MED ORDER — METHYLPREDNISOLONE SODIUM SUCC 125 MG IJ SOLR
60.0000 mg | Freq: Three times a day (TID) | INTRAMUSCULAR | Status: DC
  Administered 2019-12-25 – 2019-12-28 (×8): 60 mg via INTRAVENOUS
  Filled 2019-12-25 (×8): qty 2

## 2019-12-25 MED ORDER — DEXTROSE 50 % IV SOLN
12.5000 g | INTRAVENOUS | Status: AC
  Administered 2019-12-25: 12.5 g via INTRAVENOUS
  Filled 2019-12-25: qty 50

## 2019-12-25 MED ORDER — VANCOMYCIN HCL 1750 MG/350ML IV SOLN
1750.0000 mg | Freq: Once | INTRAVENOUS | Status: AC
  Administered 2019-12-25: 1750 mg via INTRAVENOUS
  Filled 2019-12-25: qty 350

## 2019-12-25 MED ORDER — SODIUM CHLORIDE 0.9 % IV BOLUS
500.0000 mL | Freq: Once | INTRAVENOUS | Status: AC
  Administered 2019-12-25: 500 mL via INTRAVENOUS

## 2019-12-25 MED ORDER — SODIUM CHLORIDE 0.9 % IV SOLN: INTRAVENOUS | Status: DC

## 2019-12-25 NOTE — Progress Notes (Signed)
Pharmacy Antibiotic Note  Kenneth Marinez. is a 54 y.o. male admitted on 12/25/2019 with pancreatic mass, SCC of the bone and ambulatory dysfunction.  Patient with hypotension and tachycardia.  Pharmacy has been consulted for Vancomycin and Cefepime dosing for r/o sepsis.  Plan:  Cefepime 2gm IV q8h Vancomycin 1750mg  IV x 1 then 1500 mg IV Q 12 hrs. Goal AUC 400-550.  Expected AUC: 538.9  SCr used: 0.8 (rounded up from 0.51)  Follow renal function, culture results  Weight: 179 lb 10.8 oz (81.5 kg)  Temp (24hrs), Avg:97.7 F (36.5 C), Min:97.4 F (36.3 C), Max:98 F (36.7 C)  Recent Labs  Lab 12/20/19 0654 12/20/19 0654 12/21/19 0527 12/22/19 1144 12/23/19 0500 12/24/19 0710 12/25/19 1040  WBC 25.7*   < > 22.4* 4.4 0.5* 0.1* 0.1*  CREATININE 0.73  --  0.64 0.57*  --  0.53* 0.51*   < > = values in this interval not displayed.    Estimated Creatinine Clearance: 109 mL/min (A) (by C-G formula based on SCr of 0.51 mg/dL (L)).    No Known Allergies  Antimicrobials this admission: 2/9 Vanc >>   2/9 Cefepime >>    Dose adjustments this admission:    Microbiology results: 2/9 BCx:   2/9 MRSA PCR: negative  Thank you for allowing pharmacy to be a part of this patient's care.  Everette Rank, PharmD 12/25/2019 6:56 PM

## 2019-12-25 NOTE — Progress Notes (Signed)
MD notified of RED MEWs and was asked to come to bedside to assess patient.

## 2019-12-25 NOTE — Progress Notes (Signed)
Paged Dr Lonny Prude with BP results 80/59

## 2019-12-25 NOTE — Progress Notes (Signed)
CRITICAL VALUE ALERT  Critical Value:  Platelets 25  Date & Time Notied: 12/25/19 at 1149   Provider Notified: Cordelia Poche, MD  Orders Received/Actions taken: awaiting orders

## 2019-12-25 NOTE — Progress Notes (Signed)
PT Cancellation Note  Patient Details Name: Zvi Furse. MRN: [id number] DOB: Mar 27, 1966   Cancelled Treatment:     pt looks "bad" very uncomfortable with c/o ABD pain, bloating and overall "discomfort".  "I want to get u[p but I just can't".  Reported this morning.   Checked back later in afternoon after his Radiation Tx he looks even worse.  Groggy, pale.  NT in room getting vitals.  HR 120 and BP 94/63.    Rica Koyanagi  PTA Acute  Rehabilitation Services Pager      [phone] Office      [phone]

## 2019-12-25 NOTE — Progress Notes (Signed)
Paged for Red MEWS score. Asked to see patient at bedside. Patient recently arrived from XRT.   Vitals:   12/25/19 1509 12/25/19 1535  BP: (!) 96/49 94/67  Pulse: (!) 120 (!) 121  Resp: (!) 24   Temp:    SpO2: 95% 95%    Patient with continued tachycardia; previous EKG was significant for sinus tachycardia and exam is consistent. Blood pressure slightly lower than previous baseline. Patient appears pale, sleepy but easily arouses. Still very edematous, otherwise exam is unchanged from earlier. Would prefer patient to be transferred to stepdown for closer monitoring of vitals. Will restart IV fluids secondary to soft blood pressure and start IV solumedrol for stress dosing and discontinue decadron 4 mg TID PO. Will not give albumin as this will likely not significantly improve fluid status. Eventually, patient will need diuresis with lasix when BP can support therapy to improve anasarca.  Ativan dose decreased to 0.5 mg q6 hours prn.  Transfer discussed with significant other on telephone.  Cordelia Poche, MD [facility] 12/25/2019, 3:48 PM

## 2019-12-25 NOTE — Progress Notes (Signed)
Hyperglycemia protocol initiated due to BG being 61./ see orders. Will recheck BG in 15 mins

## 2019-12-25 NOTE — Progress Notes (Signed)
Hypoglycemic Event  CBG: 64  Treatment:gave pt 8 oz juice initially, additional 8 oz  Symptoms:   Follow-up CBG: 78  Time: 0825, 0925  Possible Reasons for Event: poor oral intake due to nausea  Comments/MD notified:    Luana Shu

## 2019-12-25 NOTE — Progress Notes (Addendum)
Inpatient Rehabilitation-Admissions Coordinator   Attempted to see pt at the bedside this afternoon. Pt has recently been transferred to ICU/step down. Pt with HR in 120s and BP 80/59 on monitor. Briefly spoke to RN who just received patient who suggested we hold off on assessment.   Will continue to follow up.   Raechel Ache, OTR/L  Rehab Admissions Coordinator  [phone] 12/25/2019 5:09 PM

## 2019-12-25 NOTE — Progress Notes (Signed)
PROGRESS NOTE    Kenneth Khan.  [id number] DOB: January 24, 1966 DOA: 12/27/2019 PCP: Nicholes Rough, PA-C   Brief Narrative: Kenneth Khan. is a 54 y.o. 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 Patient presented from his oncologists office secondary to severe functional disability with safety concerns for transport to/from XRT.   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  This has made it very difficult for the patient to safely be transported to and from his radiation therapy treatments. PT/OT recommending CIR. -XRT per radiation oncology  Metastatic squamous cell cancer Unknown primary. Currently on chemotherapy and radiation therapy. XRT is daily with end date planned for 2/12. Chemotherapy with Carboplatin/Paclitaxel started on 2/1 with one dose received to date per patient. Plan was for an infusion today but this was cancelled secondary to neutrophilia. CT scan mentions pancreatic tail lesion that has large contact surface with the splenic artery.   Back pain Pain is improved with narcotic regimen and radiation therapy.   Abdominal distension Initial abdominal x-ray suggested partial small bowel obstruction vs ileus. Patient passing gas and is having watery stools. Favor ileus which may be improving but some of this is likely related to chemotherapy. Repeat abdominal x-ray is significant for likely ileus. Likely complicated by opiates. Patient vomiting, but favor related to chemotherapy rather than worsening ileus/SBO. -Stopped MiraLAX, Senna, Colace -Repeat abdominal x-ray  Anasarca In setting of IV fluids and fluid retention in setting of steroids and poor nutrition  status evidenced by hypoalbuminemia. -Discontinue fluids for now -Will watch clinically today. If vomiting improves, will give Lasix with albumin  Leukopenia Neutropenia Likely secondary to chemotherapy. No fevers overnight. Discussed with oncology on 2/7 and since afebrile, no role for Granix/Neupagen -Oncology recommendations -Neutropenic precautions -CBC pending today  Thrombocytopenia Likely secondary to chemotherapy. No evidence of bleeding at this time. -Oncology recommendations -CBC pending today  Leukocytosis Discussed with oncology. Likely related to cancer in addition to steroids. Resolved.  Elevated AST/ALT/Alkaline phosphatase Secondary to metastatic disease  Elevated BUN Likely some dehydration but also concern of possible chronic GI bleeding in setting of metastatic disease. Hemoglobin stable. Improved with fluids.  Iron deficiency anemia Likely secondary to blood loss from active malignancy per oncology. -Continue iron supplementation  Hypermagnesemia Elevated with a magnesium of 3. Recently received Maalox/Mylanta but unsure if this contributed to increase. Resolved  Hypertension Patient states he was taken off of antihypertensives. Currently normotensive.  Hyperglycemia Likely steroid induced. Hemoglobin A1C of 5.9%. Now with hypoglycemia -SSI -Discontinue Lantus   DVT prophylaxis: SCDs Code Status:   Code Status: Full Code Family Communication: None at bedside Disposition Plan: Discharge pending XRT completion vs improvement in functional ability enough to allow safe transport to/from treatments   Consultants:   None  Procedures:   XRT  Antimicrobials:  None    Subjective: Epigastric pain. No abdominal pain.  Objective: Vitals:   12/24/19 0548 12/24/19 1354 12/24/19 2127 12/25/19 0549  BP: 112/79 101/73 101/69 104/66  Pulse: (!) 105 (!) 125 (!) 110 (!) 110  Resp: 16 19 20 19   Temp: 97.7 F (36.5 C) 98.3 F (36.8 C) 98 F  (36.7 C) 97.8 F (36.6 C)  TempSrc: Axillary Oral Oral Oral  SpO2: 94% 95% 95% 95%    Intake/Output Summary (Last 24 hours) at 12/25/2019 [id number] Last data filed at 12/25/2019 0200 Gross per 24 hour  Intake 2408.26 ml  Output 750 ml  Net 1658.26 ml   There were no vitals filed for this visit.  Examination:  General exam: Appears calm and looks uncomfortable Respiratory system: Clear to auscultation. Respiratory effort normal. Cardiovascular system: S1 & S2 heard, RRR. No murmurs, rubs, gallops or clicks. Gastrointestinal system: Abdomen is distended, soft and nontender. Normal bowel sounds heard. Central nervous system: Alert and oriented. No focal neurological deficits. Extremities: No calf tenderness. LE, abdominal, thoracic edema Skin: No cyanosis. No rashes Psychiatry: Judgement and insight appear normal. Mood & affect appropriate.    Data Reviewed: I have personally reviewed following labs and imaging studies  CBC: Recent Labs  Lab 12/20/19 0654 12/21/19 0527 12/22/19 1144 12/23/19 0500 12/24/19 0710  WBC 25.7* 22.4* 4.4 0.5* 0.1*  NEUTROABS 25.0*  --   --  0.3*  --   HGB 11.6* 10.7* 10.6* 10.8* 10.8*  HCT 39.5 37.1* 35.9* 36.9* 36.2*  MCV 76.0* 77.0* 75.4* 75.3* 74.3*  PLT 157 139* 93* 69* 50*   Basic Metabolic Panel: Recent Labs  Lab 12/20/19 0654 12/21/19 0527 12/22/19 1144 12/24/19 0710  NA 134* 134* 135 133*  K 4.4 4.7 4.4 4.2  CL 100 102 105 107  CO2 25 23 22  19*  GLUCOSE 267* 288* 232* 107*  BUN 35* 33* 31* 22*  CREATININE 0.73 0.64 0.57* 0.53*  CALCIUM 8.0* 7.9* 7.8* 7.5*  MG 3.0* 2.6* 2.1 1.9   GFR: CrCl cannot be calculated (Unknown ideal weight.). Liver Function Tests: Recent Labs  Lab 12/20/19 0654  AST 103*  ALT 83*  ALKPHOS 455*  BILITOT 4.2*  PROT 5.3*  ALBUMIN 2.1*   No results for input(s): LIPASE, AMYLASE in the last 168 hours. No results for input(s): AMMONIA in the last 168 hours. Coagulation Profile: Recent Labs  Lab  12/20/19 0654  INR 1.3*   Cardiac Enzymes: No results for input(s): CKTOTAL, CKMB, CKMBINDEX, TROPONINI in the last 168 hours. BNP (last 3 results) No results for input(s): PROBNP in the last 8760 hours. HbA1C: No results for input(s): HGBA1C in the last 72 hours. CBG: Recent Labs  Lab 12/24/19 1712 12/24/19 1810 12/24/19 2130 12/25/19 0802 12/25/19 0825  GLUCAP 65* 114* 89 64* 67*   Lipid Profile: No results for input(s): CHOL, HDL, LDLCALC, TRIG, CHOLHDL, LDLDIRECT in the last 72 hours. Thyroid Function Tests: No results for input(s): TSH, T4TOTAL, FREET4, T3FREE, THYROIDAB in the last 72 hours. Anemia Panel: No results for input(s): VITAMINB12, FOLATE, FERRITIN, TIBC, IRON, RETICCTPCT in the last 72 hours. Sepsis Labs: No results for input(s): PROCALCITON, LATICACIDVEN in the last 168 hours.  Recent Results (from the past 240 hour(s))  Resp Panel by RT PCR (RSV, Flu A&B, Covid) -     Status: None   Collection Time: 01/05/2020  3:20 PM  Result Value Ref Range Status   SARS Coronavirus 2 by RT PCR NEGATIVE NEGATIVE Final    Comment: (NOTE) SARS-CoV-2 target nucleic acids are NOT DETECTED. The SARS-CoV-2 RNA is generally detectable in upper respiratoy specimens during the acute phase of infection. The lowest concentration of SARS-CoV-2 viral copies this assay can detect is 131 copies/mL. A negative result does not preclude SARS-Cov-2 infection and should not be used as the sole basis for treatment or other patient management decisions. A negative result may occur with  improper specimen  collection/handling, submission of specimen other than nasopharyngeal swab, presence of viral mutation(s) within the areas targeted by this assay, and inadequate number of viral copies (<131 copies/mL). A negative result must be combined with clinical observations, patient history, and epidemiological information. The expected result is Negative. Fact Sheet for Patients:    PinkCheek.be Fact Sheet for Healthcare Providers:  GravelBags.it This test is not yet ap proved or cleared by the [place] FDA and  has been authorized for detection and/or diagnosis of SARS-CoV-2 by FDA under an Emergency Use Authorization (EUA). This EUA will remain  in effect (meaning this test can be used) for the duration of the COVID-19 declaration under Section 564(b)(1) of the Act, 21 U.S.C. section 360bbb-3(b)(1), unless the authorization is terminated or revoked sooner.    Influenza A by PCR NEGATIVE NEGATIVE Final   Influenza B by PCR NEGATIVE NEGATIVE Final    Comment: (NOTE) The Xpert Xpress SARS-CoV-2/FLU/RSV assay is intended as an aid in  the diagnosis of influenza from Nasopharyngeal swab specimens and  should not be used as a sole basis for treatment. Nasal washings and  aspirates are unacceptable for Xpert Xpress SARS-CoV-2/FLU/RSV  testing. Fact Sheet for Patients: PinkCheek.be Fact Sheet for Healthcare Providers: GravelBags.it This test is not yet approved or cleared by the [place] FDA and  has been authorized for detection and/or diagnosis of SARS-CoV-2 by  FDA under an Emergency Use Authorization (EUA). This EUA will remain  in effect (meaning this test can be used) for the duration of the  Covid-19 declaration under Section 564(b)(1) of the Act, 21  U.S.C. section 360bbb-3(b)(1), unless the authorization is  terminated or revoked.    Respiratory Syncytial Virus by PCR NEGATIVE NEGATIVE Final    Comment: (NOTE) Fact Sheet for Patients: PinkCheek.be Fact Sheet for Healthcare Providers: GravelBags.it This test is not yet approved or cleared by the [place] FDA and  has been authorized for detection and/or diagnosis of SARS-CoV-2 by  FDA under an Emergency Use  Authorization (EUA). This EUA will remain  in effect (meaning this test can be used) for the duration of the  COVID-19 declaration under Section 564(b)(1) of the Act, 21 U.S.C.  section 360bbb-3(b)(1), unless the authorization is terminated or  revoked. Performed at [facility], [place] [address] [place], [place] [address]          Radiology Studies: DG Abd Portable 1V  Result Date: 12/23/2019 CLINICAL DATA:  Abdominal distension. EXAM: PORTABLE ABDOMEN - 1 VIEW COMPARISON:  December 21, 2019. FINDINGS: No colonic dilatation is noted. Stable mild small bowel dilatation is noted most consistent with ileus. No radio-opaque calculi or other significant radiographic abnormality are seen. IMPRESSION: Stable mild small bowel dilatation is noted most consistent with ileus. Electronically Signed   By: Marijo Conception M.D.   On: 12/23/2019 09:38        Scheduled Meds: . aspirin EC  81 mg Oral Daily  . Chlorhexidine Gluconate Cloth  6 each Topical Daily  . dexamethasone  4 mg Oral TID  . fenofibrate  54 mg Oral Daily  . ferrous sulfate  325 mg Oral Q breakfast  . insulin aspart  0-15 Units Subcutaneous TID WC  . methocarbamol  750 mg Oral TID  . pantoprazole (PROTONIX) IV  40 mg Intravenous Q12H  . pravastatin  40 mg Oral Daily   Continuous Infusions: . ondansetron (ZOFRAN) IV 8 mg (12/25/19 0830)     LOS: 6 days  Cordelia Poche, MD [facility] 12/25/2019, 8:38 AM  If 7PM-7AM, please contact night-coverage [email or web address]

## 2019-12-25 NOTE — Consult Note (Signed)
PULMONARY / CRITICAL CARE MEDICINE   NAME:  Kenneth Khan., MRN:  [id number], DOB:  [birth year], LOS: 6 ADMISSION DATE:  12/18/2019, CONSULTATION DATE:  12/25/2019  REFERRING MD:  Lonny Prude, ED, CHIEF COMPLAINT:  hypotension  BRIEF HISTORY:    54 year old man with metastatic squamous cell cancer?  GI origin with mets to bone, spine and liver admitted 2/3 for pain control and deconditioning, last chemotherapy 2/1 PCCM consulted for hypotension and tachycardia after returning from radiation therapy 2/9   HISTORY OF PRESENT ILLNESS   54 year old man with metastatic squamous cell cancer who presented in 10/2019 with mets to spine and CT abdomen showing pancreatic mass and liver lesions.  Liver biopsy showed squamous cell carcinoma, EGD and sigmoidoscopy did not show any primary.  PET scan showed mass in pancreas and the liver, hypermetabolic. He started chemotherapy with carboplatin and paclitaxel on 2/1 and was admitted on 2/3 to [facility] long for generalized weakness, pain control and deconditioning.  He has been unable to ambulate due to metastatic spinal lesions  I have reviewed oncology note and hospitalist notes Back pain apparently improved with narcotic regimen and radiation.  He developed abdominal distention, with x-ray suggesting ileus .  He developed pancytopenia On 2/9 after returning from radiation, he was noted to be tachycardic and hypotensive.  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, liver  SIGNIFICANT EVENTS:   STUDIES:   1/19 PET scan -hypermetabolic pancreatic mass, liver mets, upper abdominal nodal mets, increased pleural uptake on left, multiple bone  metastases CULTURES:    ANTIBIOTICS:    LINES/TUBES:    CONSULTANTS:  ONc SUBJECTIVE:    CONSTITUTIONAL: BP (!) 87/57 (BP Location: Left Arm)   Pulse (!) 115   Temp (!) 97.4 F (36.3 C) (Oral)   Resp (!) 25   SpO2 100%   I/O last 3 completed shifts: In: 2408.3 [P.O.:120; I.V.:2234.3; IV Piggyback:54] Out: 1150 [Urine:1150]        PHYSICAL EXAM: General: Weak appearing, middle-aged man, well-built, appears exhausted after radiation Neuro: Sleepy but wakes up easily and follows commands, grossly nonfocal HEENT: Pallor present, no icterus, no JVD Cardiovascular: S1-S2 tacky Lungs: Clear breath sounds bilateral Abdomen: Soft mildly distended hypoactive bowel sounds Musculoskeletal: No deformity Skin: No rash or petechiae, 1+ edema, decreased turgor  X-ray abdomen 2/9 personally reviewed, few distended small bowel loops in midabdomen  RESOLVED PROBLEM LIST   ASSESSMENT AND PLAN   Hypotension and tachycardia, appears dehydrated due to diarrhea and vomiting Sepsis possible but less likely in this immunocompromised patient. Unfortunately he has had several side effects from chemotherapy and because of his poor functional status has tolerated this poorly   Recommend -Use albumin 5% for resuscitation -Empiric broad-spectrum antibiotics -cefepime and vancomycin -stress dose steroids started by primary -Doubt need for pressors but if required can use Port-A-Cath  PCCM to follow peripherally      LABS  Glucose Recent Labs  Lab 12/25/19 0802 12/25/19 0825 12/25/19 0920 12/25/19 1201 12/25/19 1645 12/25/19 1706  GLUCAP 64* 67* 78 91 61* 81    BMET Recent Labs  Lab 12/22/19 1144 12/24/19 0710 12/25/19 1040  NA 135 133* 134*  K 4.4  4.2 4.2  CL 105 107 108  CO2 22 19* 19*  BUN 31* 22* 20  CREATININE 0.57* 0.53* 0.51*  GLUCOSE 232* 107* 110*    Liver Enzymes Recent Labs  Lab 12/20/19 0654 12/25/19 1040  AST 103*  --   ALT 83*  --    ALKPHOS 455*  --   BILITOT 4.2*  --   ALBUMIN 2.1* 1.5*    Electrolytes Recent Labs  Lab 12/21/19 0527 12/21/19 0527 12/22/19 1144 12/24/19 0710 12/25/19 1040  CALCIUM 7.9*   < > 7.8* 7.5* 7.2*  MG 2.6*  --  2.1 1.9  --   PHOS  --   --   --   --  2.5   < > = values in this interval not displayed.    CBC Recent Labs  Lab 12/23/19 0500 12/24/19 0710 12/25/19 1040  WBC 0.5* 0.1* 0.1*  HGB 10.8* 10.8* 10.2*  HCT 36.9* 36.2* 33.2*  PLT 69* 50* 25*    ABG No results for input(s): PHART, PCO2ART, PO2ART in the last 168 hours.  Coag's Recent Labs  Lab 12/20/19 0654  INR 1.3*    Sepsis Markers No results for input(s): LATICACIDVEN, PROCALCITON, O2SATVEN in the last 168 hours.  Cardiac Enzymes No results for input(s): TROPONINI, PROBNP in the last 168 hours.  PAST MEDICAL HISTORY :   He  has a past medical history of Cancer ([place]) and Hypertension.  PAST SURGICAL HISTORY:  He  has a past surgical history that includes No past surgeries and IR IMAGING GUIDED PORT INSERTION (11/19/2019).  No Known Allergies  No current facility-administered medications on file prior to encounter.   Current Outpatient Medications on File Prior to Encounter  Medication Sig  . aspirin EC 81 MG tablet Take 81 mg by mouth daily.  Marland Kitchen dexamethasone (DECADRON) 4 MG tablet Take 1 tablet (4 mg total) by mouth 3 (three) times daily.  . fenofibrate 54 MG tablet Take 54 mg by mouth daily.  . ferrous sulfate 325 (65 FE) MG tablet Take 1 tablet (325 mg total) by mouth daily with breakfast. Take with orange juice.  . lidocaine-prilocaine (EMLA) cream Apply 1 application topically as needed. (Patient taking differently: Apply 1 application topically as needed (port access). )  . methocarbamol (ROBAXIN) 750 MG tablet Take 1 tablet (750 mg total) by mouth 3 (three) times daily.  . ondansetron (ZOFRAN) 8 MG tablet Take 1 tablet (8 mg total) by mouth every 8 (eight) hours as needed for nausea or  vomiting.  Marland Kitchen oxyCODONE (OXY IR/ROXICODONE) 5 MG immediate release tablet Take 1-2 tablets (5-10 mg total) by mouth every 6 (six) hours as needed for severe pain.  . pravastatin (PRAVACHOL) 40 MG tablet Take 40 mg by mouth daily.  . prochlorperazine (COMPAZINE) 10 MG tablet Take 1 tablet (10 mg total) by mouth every 6 (six) hours as needed for nausea or vomiting.    FAMILY HISTORY:   His family history includes Non-Hodgkin's lymphoma in his father.  SOCIAL HISTORY:  He  reports that he has never smoked. He has never used smokeless tobacco. He reports current alcohol use. He reports that he does not use drugs.  REVIEW OF SYSTEMS:    Unable to obtain since patient is sleepy Reviewed from fiancee and noted in HPI  Kara Mead MD. FCCP. [facility]  If no response to pager , please call [phone]   12/25/2019

## 2019-12-25 NOTE — Progress Notes (Signed)
MD at bedside to assess the patient. MD plans to move the patient to a higher level of care will continue to monitor.

## 2019-12-26 ENCOUNTER — Inpatient Hospital Stay (HOSPITAL_COMMUNITY): Payer: Managed Care, Other (non HMO)

## 2019-12-26 ENCOUNTER — Ambulatory Visit: Payer: Managed Care, Other (non HMO)

## 2019-12-26 DIAGNOSIS — R000 Tachycardia, unspecified: Secondary | ICD-10-CM

## 2019-12-26 DIAGNOSIS — D61818 Other pancytopenia: Secondary | ICD-10-CM

## 2019-12-26 DIAGNOSIS — A419 Sepsis, unspecified organism: Secondary | ICD-10-CM

## 2019-12-26 DIAGNOSIS — R140 Abdominal distension (gaseous): Secondary | ICD-10-CM

## 2019-12-26 DIAGNOSIS — E861 Hypovolemia: Secondary | ICD-10-CM | POA: Diagnosis present

## 2019-12-26 DIAGNOSIS — K567 Ileus, unspecified: Secondary | ICD-10-CM

## 2019-12-26 LAB — BLOOD CULTURE ID PANEL (REFLEXED)

## 2019-12-26 LAB — GLUCOSE, CAPILLARY
Glucose-Capillary: 100 mg/dL — ABNORMAL HIGH (ref 70–99)
Glucose-Capillary: 105 mg/dL — ABNORMAL HIGH (ref 70–99)
Glucose-Capillary: 111 mg/dL — ABNORMAL HIGH (ref 70–99)
Glucose-Capillary: 129 mg/dL — ABNORMAL HIGH (ref 70–99)
Glucose-Capillary: 79 mg/dL (ref 70–99)
Glucose-Capillary: 80 mg/dL (ref 70–99)
Glucose-Capillary: 89 mg/dL (ref 70–99)

## 2019-12-26 LAB — CBC
HCT: 30.7 % — ABNORMAL LOW (ref 39.0–52.0)
Hemoglobin: 9.2 g/dL — ABNORMAL LOW (ref 13.0–17.0)
MCH: 22.7 pg — ABNORMAL LOW (ref 26.0–34.0)
MCHC: 30 g/dL (ref 30.0–36.0)
MCV: 75.6 fL — ABNORMAL LOW (ref 80.0–100.0)
Platelets: 15 10*3/uL — CL (ref 150–400)
RBC: 4.06 MIL/uL — ABNORMAL LOW (ref 4.22–5.81)
RDW: 22.5 % — ABNORMAL HIGH (ref 11.5–15.5)
WBC: 0.3 10*3/uL — CL (ref 4.0–10.5)
nRBC: 0 % (ref 0.0–0.2)

## 2019-12-26 LAB — CREATININE, SERUM
Creatinine, Ser: 0.54 mg/dL — ABNORMAL LOW (ref 0.61–1.24)
GFR calc Af Amer: >60 mL/min (ref >60)
GFR calc non Af Amer: >60 mL/min (ref >60)

## 2019-12-26 LAB — PROCALCITONIN: Procalcitonin: 2.76 ng/mL

## 2019-12-26 MED ORDER — GERHARDT'S BUTT CREAM
TOPICAL_CREAM | Freq: Two times a day (BID) | CUTANEOUS | Status: DC
  Administered 2019-12-29 – 2019-12-31 (×3): 1 via TOPICAL
  Filled 2019-12-26: qty 1

## 2019-12-26 MED ORDER — ORAL CARE MOUTH RINSE
15.0000 mL | Freq: Two times a day (BID) | OROMUCOSAL | Status: DC
  Administered 2019-12-26 – 2020-01-03 (×14): 15 mL via OROMUCOSAL

## 2019-12-26 MED ORDER — MAGIC MOUTHWASH W/LIDOCAINE
5.0000 mL | Freq: Four times a day (QID) | ORAL | Status: DC | PRN
  Administered 2019-12-30: 5 mL via ORAL
  Filled 2019-12-26 (×3): qty 5

## 2019-12-26 MED ORDER — ALBUMIN HUMAN 5 % IV SOLN
25.0000 g | Freq: Once | INTRAVENOUS | Status: AC
  Administered 2019-12-26: 25 g via INTRAVENOUS
  Filled 2019-12-26: qty 250

## 2019-12-26 MED ORDER — FUROSEMIDE 10 MG/ML IJ SOLN
40.0000 mg | Freq: Once | INTRAMUSCULAR | Status: AC
  Administered 2019-12-26: 40 mg via INTRAVENOUS
  Filled 2019-12-26: qty 4

## 2019-12-26 MED ORDER — CHLORHEXIDINE GLUCONATE 0.12 % MT SOLN
15.0000 mL | Freq: Two times a day (BID) | OROMUCOSAL | Status: DC
  Administered 2019-12-26 – 2020-01-03 (×17): 15 mL via OROMUCOSAL
  Filled 2019-12-26 (×11): qty 15

## 2019-12-26 NOTE — Progress Notes (Signed)
PT Cancellation Note  Patient Details Name: Kenneth Khan. MRN: [id number] DOB: 1965-12-22   Cancelled Treatment:     Pt transferred to ICU yesterday afternoon due to medical decline.  Will hold off for today and check back another.   Rica Koyanagi  PTA Acute  Rehabilitation Services Pager      [phone] Office      [phone]

## 2019-12-26 NOTE — Progress Notes (Addendum)
[facility]                                                                              Patient Demographics  Kenneth Khan, is a 54 y.o. male, DOB - 1966/04/12, LX:[id number]  Admit date - 01/03/2020   Admitting Physician Mariel Aloe, MD  Outpatient Primary MD for the patient is Nicholes Rough, PA-C  Outpatient specialists:   LOS - 7  days   Medical records reviewed and are as summarized below:    No chief complaint on file.      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  Patient presented from his oncologist office secondary to severe functional debility. 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  Liver biopsy showed metastatic squamous cell carcinoma.  PET/CT 11/2019 showed mass in pancreas and liver -currently on chemotherapy and radiation therapy, and date planned on 2/12 -Chemotherapy with carboplatin/paclitaxel started on 2/1. -Oncology following.  -Unfortunately has severe leukopenia, thrombocytopenia, likely chemotherapy induced cytopenias.  Per oncology, transfuse for platelets less than 10   Severe generalized debility -In the setting of metastatic cancer, spinal lesions, poor functional status.  PT OT recommended CIR when able. -Back pain improving with narcotic regimen and XRT  Hypotension, tachycardia, SIRS -Transfer to STU on 2/9, CCM was consulted -Patient was placed on albumin 5% for resuscitation, empiric broad-spectrum antibiotics with vancomycin and cefepime, stress dose steroids  Abdominal  distention/ileus -Abdominal x-ray on 2/9 showed few air-filled distended small bowel loops in the mid abdomen, question ileus.  Currently n.p.o.  Mild respiratory distress, large b/l pleural effusions, anasarca -Patient appears to have mild increased work of breathing however no acute respiratory failure, remains tachypneic -Currently full code, given all the above, have requested palliative medicine for establishing goals of care. -Chest x-ray today shows large right pleural effusion and moderate to large left pleural effusion with associated passive atelectasis and/or pneumonia involving the lower lobes -CCM following, will await their recommendations regarding bedside thoracentesis, likely malignant versus IR   Cytopenias with leukopenia, thrombocytopenia -Unfortunately has developed severe cytopenia secondary to chemotherapy. -Oncology following, recommend to transfuse platelets for less than 10  Transaminitis, elevated alkaline phosphatase -Likely secondary to metastatic disease  Iron deficiency anemia Likely secondary to blood loss from active malignancy, continue iron supplementation  History of essential hypertension -Currently BP soft, off antihypertensives  Pressure injury: POA Buttocks right, left, mid stage II Bilateral heels, stage I  -Wound care per nursing   Code Status: Full code DVT Prophylaxis: SCD Family Communication: Discussed all imaging results, lab results, explained to the patient's significant other on the phone.   Disposition Plan: Patient from home, unable to return home with severe functional debility, needs to complete XRT on 2/12.  If respiratory status improves  and able to endure PT, will need CIR Time Spent in minutes   35 minutes  Procedures:  None  Consultants:   *Oncology CCM CIR Palliative medicine  Antimicrobials:   Anti-infectives (From admission, onward)   Start     Dose/Rate Route Frequency Ordered Stop   12/26/19 0800   vancomycin (VANCOREADY) IVPB 1500 mg/300 mL     1,500 mg 150 mL/hr over 120 Minutes Intravenous Every 12 hours 12/25/19 1902     12/25/19 1900  ceFEPIme (MAXIPIME) 2 g in sodium chloride 0.9 % 100 mL IVPB     2 g 200 mL/hr over 30 Minutes Intravenous Every 8 hours 12/25/19 1844     12/25/19 1900  vancomycin (VANCOREADY) IVPB 1750 mg/350 mL     1,750 mg 175 mL/hr over 120 Minutes Intravenous  Once 12/25/19 1845 12/25/19 2347          Medications  Scheduled Meds: . Chlorhexidine Gluconate Cloth  6 each Topical Daily  . fenofibrate  54 mg Oral Daily  . ferrous sulfate  325 mg Oral Q breakfast  . insulin aspart  0-6 Units Subcutaneous Q4H  . methocarbamol  750 mg Oral TID  . methylPREDNISolone (SOLU-MEDROL) injection  60 mg Intravenous Q8H  . pantoprazole (PROTONIX) IV  40 mg Intravenous Q12H  . pravastatin  40 mg Oral Daily  . sodium chloride flush  10-40 mL Intracatheter Q12H   Continuous Infusions: . sodium chloride 75 mL/hr at 12/25/19 2148  . ceFEPime (MAXIPIME) IV Stopped (12/26/19 [id number])  . ondansetron (ZOFRAN) IV Stopped (12/25/19 0845)  . vancomycin Stopped (12/26/19 1010)   PRN Meds:.acetaminophen **OR** acetaminophen, alum & mag hydroxide-simeth **AND** [COMPLETED] lidocaine, calcium carbonate, lip balm, LORazepam, Melatonin, ondansetron (ZOFRAN) IV, oxyCODONE, prochlorperazine, sodium chloride flush      Subjective:   Kenneth Khan was seen and examined today.  Ill-appearing, in mild respiratory distress, increased work of breathing.  Difficult to obtain review of system, alert and awake, denies any acute complaints.  No fevers or chills.  No nausea or vomiting or diarrhea.  Objective:   Vitals:   12/26/19 0700 12/26/19 0800 12/26/19 0900 12/26/19 1000  BP: (!) 98/55 103/62 99/61 (!) 98/56  Pulse: (!) 102 (!) 108 (!) 107 (!) 102  Resp: 14 (!) 22 19 16   Temp:  97.6 F (36.4 C)    TempSrc:  Oral    SpO2: 100% 100% 99% 100%  Weight:        Intake/Output  Summary (Last 24 hours) at 12/26/2019 1024 Last data filed at 12/26/2019 1019 Gross per 24 hour  Intake 2114.3 ml  Output 1900 ml  Net 214.3 ml     Wt Readings from Last 3 Encounters:  12/25/19 81.5 kg  11/19/19 81.5 kg  11/14/19 82.7 kg     Exam  General: Alert and awake, ill-appearing very deconditioned, mild respiratory distress  Cardiovascular: S1 S2 auscultated,  RRR  Respiratory: Diminished breath sounds throughout  Gastrointestinal: Soft, nontender, nondistended, + bowel sounds  Ext: 2+ pitting edema, anasarca  Neuro: Alert and awake, unable to follow commands  Musculoskeletal: No digital cyanosis, clubbing  Skin: No rashes  Psych: Ill-appearing, answers simple questions   Data Reviewed:  I have personally reviewed following labs and imaging studies  Micro Results Recent Results (from the past 240 hour(s))  Resp Panel by RT PCR (RSV, Flu A&B, Covid) -     Status: None   Collection Time: 12/28/2019  3:20 PM  Result Value Ref Range Status  SARS Coronavirus 2 by RT PCR NEGATIVE NEGATIVE Final    Comment: (NOTE) SARS-CoV-2 target nucleic acids are NOT DETECTED. The SARS-CoV-2 RNA is generally detectable in upper respiratoy specimens during the acute phase of infection. The lowest concentration of SARS-CoV-2 viral copies this assay can detect is 131 copies/mL. A negative result does not preclude SARS-Cov-2 infection and should not be used as the sole basis for treatment or other patient management decisions. A negative result may occur with  improper specimen collection/handling, submission of specimen other than nasopharyngeal swab, presence of viral mutation(s) within the areas targeted by this assay, and inadequate number of viral copies (<131 copies/mL). A negative result must be combined with clinical observations, patient history, and epidemiological information. The expected result is Negative. Fact Sheet for Patients:    PinkCheek.be Fact Sheet for Healthcare Providers:  GravelBags.it This test is not yet ap proved or cleared by the [place] FDA and  has been authorized for detection and/or diagnosis of SARS-CoV-2 by FDA under an Emergency Use Authorization (EUA). This EUA will remain  in effect (meaning this test can be used) for the duration of the COVID-19 declaration under Section 564(b)(1) of the Act, 21 U.S.C. section 360bbb-3(b)(1), unless the authorization is terminated or revoked sooner.    Influenza A by PCR NEGATIVE NEGATIVE Final   Influenza B by PCR NEGATIVE NEGATIVE Final    Comment: (NOTE) The Xpert Xpress SARS-CoV-2/FLU/RSV assay is intended as an aid in  the diagnosis of influenza from Nasopharyngeal swab specimens and  should not be used as a sole basis for treatment. Nasal washings and  aspirates are unacceptable for Xpert Xpress SARS-CoV-2/FLU/RSV  testing. Fact Sheet for Patients: PinkCheek.be Fact Sheet for Healthcare Providers: GravelBags.it This test is not yet approved or cleared by the [place] FDA and  has been authorized for detection and/or diagnosis of SARS-CoV-2 by  FDA under an Emergency Use Authorization (EUA). This EUA will remain  in effect (meaning this test can be used) for the duration of the  Covid-19 declaration under Section 564(b)(1) of the Act, 21  U.S.C. section 360bbb-3(b)(1), unless the authorization is  terminated or revoked.    Respiratory Syncytial Virus by PCR NEGATIVE NEGATIVE Final    Comment: (NOTE) Fact Sheet for Patients: PinkCheek.be Fact Sheet for Healthcare Providers: GravelBags.it This test is not yet approved or cleared by the [place] FDA and  has been authorized for detection and/or diagnosis of SARS-CoV-2 by  FDA under an Emergency Use  Authorization (EUA). This EUA will remain  in effect (meaning this test can be used) for the duration of the  COVID-19 declaration under Section 564(b)(1) of the Act, 21 U.S.C.  section 360bbb-3(b)(1), unless the authorization is terminated or  revoked. Performed at [facility], [place] [address] [place], [place] [address]   MRSA PCR Screening     Status: None   Collection Time: 12/25/19  4:37 PM   Specimen: Nasal Mucosa; Nasopharyngeal  Result Value Ref Range Status   MRSA by PCR NEGATIVE NEGATIVE Final    Comment:        The GeneXpert MRSA Assay (FDA approved for NASAL specimens only), is one component of a comprehensive MRSA colonization surveillance program. It is not intended to diagnose MRSA infection nor to guide or monitor treatment for MRSA infections. Performed at [facility], [place] [address], [place], [place] [address]   Culture, blood (Routine X 2) w Reflex to ID Panel     Status:  None (Preliminary result)   Collection Time: 12/25/19  7:25 PM   Specimen: BLOOD RIGHT ARM  Result Value Ref Range Status   Specimen Description   Final    BLOOD RIGHT ARM Performed at [facility], [address], [place], [place] [address]    Special Requests   Final    BOTTLES DRAWN AEROBIC AND ANAEROBIC Blood Culture adequate volume Performed at [place] [address] [place], [place] [address]    Culture  Setup Time   Final    GRAM NEGATIVE RODS IN BOTH AEROBIC AND ANAEROBIC BOTTLES Organism ID to follow    Culture   Final    CULTURE REINCUBATED FOR BETTER GROWTH Performed at [facility], [place] [address], [place], [place] [address]    Report Status PENDING  Incomplete  Culture, blood (Routine X 2) w Reflex to ID Panel     Status: None (Preliminary result)   Collection Time: 12/25/19  7:25 PM   Specimen: BLOOD RIGHT ARM  Result Value Ref Range Status   Specimen Description   Final    BLOOD RIGHT  ARM Performed at [facility], [place] [address], [place], [place] [address]    Special Requests   Final    BOTTLES DRAWN AEROBIC ONLY Blood Culture adequate volume Performed at [place] [address] [place], [place] [address]    Culture  Setup Time GRAM NEGATIVE RODS AEROBIC BOTTLE ONLY   Final   Culture   Final    CULTURE REINCUBATED FOR BETTER GROWTH Performed at [facility], [place] [address], [place], [place] [address]    Report Status PENDING  Incomplete    Radiology Reports NM PET Image Initial (PI) Whole Body  Result Date: 12/04/2019 CLINICAL DATA:  Initial treatment strategy for cancer of unknown primary. EXAM: NUCLEAR MEDICINE PET WHOLE BODY TECHNIQUE: 8.89 mCi F-18 FDG was injected intravenously. Full-ring PET imaging was performed from the skull base to thigh after the radiotracer. CT data was obtained and used for attenuation correction and anatomic localization. Fasting blood glucose: 115 mg/dl COMPARISON:  CT chest, abdomen and pelvis from 11/03/2019. FINDINGS: Mediastinal blood pool activity: SUV max 2.37 HEAD/NECK: FDG avid lytic lesion within the right temporal bone measures 1.3 cm and has an SUV max of 9.29. No hypermetabolic lymph nodes within the soft tissues of the neck. Incidental CT findings: none CHEST: Right supraclavicular node measures 9 mm short axis and has an SUV max 4.4. No hypermetabolic axillary, mediastinal, or hilar lymph nodes. Moderate bilateral pleural effusions are identified with overlying passive atelectasis. No hypermetabolic mass within the lungs. Mild asymmetric increased uptake along the pleura within the posterior costophrenic sulcus is identified with SUV max of 5.28. Incidental CT findings: none ABDOMEN/PELVIS: FDG avid mass within the tail of pancreas measures 6.7 x 5.0 cm and has an SUV max of 9.38. The tumor extends up to and may involve the splenic hilum. Extensive liver metastasis.  Lesions are too numerous to  count. Index lesion within left lobe of liver measures 7.4 by 6.2 cm and has an SUV max of 10.2. Posteromedial right hepatic lobe lesion measures 4.2 cm, image 135/4 and has an SUV max of 7.1. The adrenal glands are unremarkable.  No suspicious kidney mass. FDG avid lymph nodes are identified within the upper abdomen. -index peripancreatic node measures 1.4 cm short axis and has an SUV max of 5.9. -index portacaval node measures 1.2 cm within  SUV max of 5.16. Small to moderate volume of abdominopelvic ascites identified. Incidental CT findings: Gallstone identified. SKELETON: Multifocal FDG avid bone lesions are noted involving the axial and proximal appendicular skeleton. -expansile, lytic bone lesion with pathologic fracture involving the posterior aspect of the fifth rib measures 2.5 cm and has an SUV max of 8.26. -lucent lesion involving the inferior tip of the body of right scapula measures 1.4 cm with SUV max of 9.1. -lucent lesion within the posterior column of the left acetabulum is also noted within SUV max of 10.24. -lytic lesion involving the L1 vertebra is identified with SUV max of 8.0. Described on MRI from 11/05/2019 is tumor involving T12 and L1 again noted. There has been progression of compression deformity at T12 with FDG avid soft tissue within the canal. This may be of neurologic significance. SUV max within the L1 vertebra is equal to 10.5. Incidental CT findings: none EXTREMITIES: Of potential orthopedic significance is a lytic lesion involving the femoral neck of the proximal left femur measuring 2.2 cm within SUV max of 5.98. also concerning is a 4.3 cm lytic lesion is identified involving the posterior cortex of the proximal diaphysis of the left femur within SUV max of 9.12 Incidental CT findings: none IMPRESSION: 1. Within the distal tail of pancreas there is a large FDG avid mass worrisome for primary pancreas adenocarcinoma. 2. Extensive FDG avid liver metastasis and upper abdominal  nodal metastasis. 3. Moderate bilateral pleural effusions identified. Increased pleural uptake within the left posterior costophrenic sulcus is noted which may reflect underlying malignant effusion. 4. Multifocal FDG avid lytic bone metastases. Progressive compression deformity involving the T12 vertebra is noted and there is increased, FDG avid soft tissue within the canal at this level. This may be of neurologic significance. FDG avid T1 metastasis also noted corresponding to lucent lesion on the CT images. 5. Left proximal femur FDG avid lytic lesions are noted. Although no pathologic fractures present at this time these may be of orthopedic significance in the future. 6. Hypermetabolic lytic lesion is noted involving the right temporal bone which erodes into the mastoid air cells. Electronically Signed   By: Kerby Moors M.D.   On: 12/04/2019 12:03   DG CHEST PORT 1 VIEW  Result Date: 12/26/2019 CLINICAL DATA:  54 year old with current history of hypertension, metastatic pancreatic cancer, presenting with acute onset of shortness of breath. EXAM: PORTABLE CHEST 1 VIEW COMPARISON:  PET-CT 12/04/2019. CT chest 11/03/2019. Chest x-ray 11/03/2019. FINDINGS: Since the prior examinations, interval development of a large RIGHT pleural effusion and a moderate to large LEFT pleural effusion with associated consolidation in the lower lobes. Lungs otherwise clear. Cardiac silhouette normal in size, unchanged. Pulmonary vascularity normal. RIGHT jugular Port-A-Cath tip projects over the LOWER SVC. IMPRESSION: Large RIGHT pleural effusion and moderate to large LEFT pleural effusion with associated passive atelectasis and/or pneumonia involving the lower lobes. Electronically Signed   By: Evangeline Dakin M.D.   On: 12/26/2019 09:28   DG Abd Portable 1V  Result Date: 12/25/2019 CLINICAL DATA:  Ileus, nausea, vomiting, hypertension, metastatic cancer EXAM: PORTABLE ABDOMEN - 1 VIEW COMPARISON:  Portable exam 0729  hours compared to 12/23/2019 FINDINGS: Scattered gas in colon. Few air-filled distended small bowel loops in mid abdomen. No bowel wall thickening or definite obstruction. Small amount of gas in rectum. Osseous structures unremarkable. IMPRESSION: Few air-filled distended small bowel loops in the mid abdomen question ileus, little changed. Electronically Signed   By: Crist Infante.D.  On: 12/25/2019 09:34   DG Abd Portable 1V  Result Date: 12/23/2019 CLINICAL DATA:  Abdominal distension. EXAM: PORTABLE ABDOMEN - 1 VIEW COMPARISON:  December 21, 2019. FINDINGS: No colonic dilatation is noted. Stable mild small bowel dilatation is noted most consistent with ileus. No radio-opaque calculi or other significant radiographic abnormality are seen. IMPRESSION: Stable mild small bowel dilatation is noted most consistent with ileus. Electronically Signed   By: Marijo Conception M.D.   On: 12/23/2019 09:38   DG Abd Portable 1V  Result Date: 12/21/2019 CLINICAL DATA:  Abdominal distension EXAM: PORTABLE ABDOMEN - 1 VIEW COMPARISON:  11/03/2019 FINDINGS: There are gas distended loops of large and small bowel throughout the abdomen. Stomach is also distended. Small to moderate volume of stool within the right hemicolon. Compression fracture of the T12 vertebral body without definite progression from prior. Pathologic nondisplaced fractures of the posterior and lateral aspects of the right eighth rib. IMPRESSION: 1. Gas distended loops of large and small bowel throughout the abdomen. Findings most suggestive of ileus, although developing small bowel obstruction not excluded. 2. Pathologic nondisplaced fractures of the posterior and lateral aspects of the right eighth rib. The posterior fracture component appears acute or subacute. 3. Pathologic compression fracture of the T12 vertebral body, not significantly progressed compared to prior CT. Electronically Signed   By: Davina Poke D.O.   On: 12/21/2019 14:52    Lab  Data:  CBC: Recent Labs  Lab 12/20/19 0654 12/21/19 0527 12/22/19 1144 12/23/19 0500 12/24/19 0710 12/25/19 1040 12/26/19 0311  WBC 25.7*   < > 4.4 0.5* 0.1* 0.1* 0.3*  NEUTROABS 25.0*  --   --  0.3*  --   --   --   HGB 11.6*   < > 10.6* 10.8* 10.8* 10.2* 9.2*  HCT 39.5   < > 35.9* 36.9* 36.2* 33.2* 30.7*  MCV 76.0*   < > 75.4* 75.3* 74.3* 74.8* 75.6*  PLT 157   < > 93* 69* 50* 25* 15*   < > = values in this interval not displayed.   Basic Metabolic Panel: Recent Labs  Lab 12/20/19 0654 12/20/19 0654 12/21/19 0527 12/22/19 1144 12/24/19 0710 12/25/19 1040 12/26/19 0311  NA 134*  --  134* 135 133* 134*  --   K 4.4  --  4.7 4.4 4.2 4.2  --   CL 100  --  102 105 107 108  --   CO2 25  --  23 22 19* 19*  --   GLUCOSE 267*  --  288* 232* 107* 110*  --   BUN 35*  --  33* 31* 22* 20  --   CREATININE 0.73   < > 0.64 0.57* 0.53* 0.51* 0.54*  CALCIUM 8.0*  --  7.9* 7.8* 7.5* 7.2*  --   MG 3.0*  --  2.6* 2.1 1.9  --   --   PHOS  --   --   --   --   --  2.5  --    < > = values in this interval not displayed.   GFR: Estimated Creatinine Clearance: 109 mL/min (A) (by C-G formula based on SCr of 0.54 mg/dL (L)). Liver Function Tests: Recent Labs  Lab 12/20/19 0654 12/25/19 1040  AST 103*  --   ALT 83*  --   ALKPHOS 455*  --   BILITOT 4.2*  --   PROT 5.3*  --   ALBUMIN 2.1* 1.5*   No results for input(s): LIPASE, AMYLASE  in the last 168 hours. No results for input(s): AMMONIA in the last 168 hours. Coagulation Profile: Recent Labs  Lab 12/20/19 0654  INR 1.3*   Cardiac Enzymes: No results for input(s): CKTOTAL, CKMB, CKMBINDEX, TROPONINI in the last 168 hours. BNP (last 3 results) No results for input(s): PROBNP in the last 8760 hours. HbA1C: No results for input(s): HGBA1C in the last 72 hours. CBG: Recent Labs  Lab 12/25/19 1706 12/25/19 2002 12/26/19 0035 12/26/19 0423 12/26/19 0717  GLUCAP 81 88 80 79 89   Lipid Profile: No results for input(s):  CHOL, HDL, LDLCALC, TRIG, CHOLHDL, LDLDIRECT in the last 72 hours. Thyroid Function Tests: No results for input(s): TSH, T4TOTAL, FREET4, T3FREE, THYROIDAB in the last 72 hours. Anemia Panel: No results for input(s): VITAMINB12, FOLATE, FERRITIN, TIBC, IRON, RETICCTPCT in the last 72 hours. Urine analysis:    Component Value Date/Time   COLORURINE STRAW (A) 11/04/2019 0744   APPEARANCEUR CLEAR 11/04/2019 0744   LABSPEC 1.009 11/04/2019 [place] 7.0 11/04/2019 [place] 11/04/2019 [place] 11/04/2019 [place] 11/04/2019 [place] 11/04/2019 0744   PROTEINUR NEGATIVE 11/04/2019 0744   NITRITE NEGATIVE 11/04/2019 0744   LEUKOCYTESUR NEGATIVE 11/04/2019 0744     Aubri Gathright M.D. [facility] 12/26/2019, 10:24 AM   Call night coverage person covering after 7pm

## 2019-12-26 NOTE — Progress Notes (Signed)
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 Noted to have lab abnormalities. CT Thoracic Spine/Lumbar shows lesion to T1 and compression fracture of T12. CT C/A/Pshowed4 x 5 cm ill-defined hypoechoic mass within the pancreatic tailandinnumerable low-density lesions throughout the liver noted and compatible with metastatic disease. Noted to have microcytic anemia and hypercalcemia to 13.8. 2) 11/05/2019: underwent US guided biopsy of liver lesion. Pathologyconfirmed metastatic squamous cell carcinoma. Requested EGD to confirm esophageal primary. 3) 11/21/2019: patient underwent EGD and sigmoidoscopy. No clear evidence of malignancy noted on these procedures. 4) 12/04/2019: PET CT scan showed mass in pancreas and liver, but no alternative sites of primary tumor.  5) 12/17/2019: Cycle 1 Day 1 of Carboplatin/Paclitaxel  6) 12/18/2019: Admitted to [facility] for worsening functional status.   HISTORY OF PRESENTING ILLNESS:  Kenneth Khan. 54 y.o. male with medical history significant for metastatic squamous cell cancer of unclear origin who was admitted for pain control and marked deconditioning. In the interim since his last visit he began chemotherapy on 12/17/2019.  On exam today [name] is markedly somnolent.  He has been transferred to the ICU due to concern for hypotension tachycardia.  He is easily arousable and answers questions appropriately, noting that he is just very tired and feels very weak.  He is breathing with an open mouth, but reports that the pain in his back and his stomach are improved from prior.  He notes he has not been able to eat  since transferred to the ICU.  Our discussion today was limited as he was not able to stay awake for most of our discussion.  A focused ROS was negative.   MEDICAL HISTORY:  Past Medical History:  Diagnosis Date  . Cancer ([place])   . Hypertension    ALLERGIES:  has No Known Allergies.  MEDICATIONS:  Current Facility-Administered Medications  Medication Dose Route Frequency Provider Last Rate Last Admin  . acetaminophen (TYLENOL) tablet 650 mg  650 mg Oral Q6H PRN Mariel Aloe, MD   650 mg at 12/20/19 2059   Or  . acetaminophen (TYLENOL) suppository 650 mg  650 mg Rectal Q6H PRN Mariel Aloe, MD      . alum & mag hydroxide-simeth (MAALOX/MYLANTA) 200-200-20 MG/5ML suspension 30 mL  30 mL Oral Q6H PRN Mariel Aloe, MD      . calcium carbonate (TUMS - dosed in mg elemental calcium) chewable tablet 200 mg of elemental calcium  1 tablet Oral TID WC PRN Mariel Aloe, MD   200 mg of elemental calcium at 12/21/19 0903  . ceFEPIme (MAXIPIME) 2 g in sodium chloride 0.9 % 100 mL IVPB  2 g Intravenous Q8H Poindexter, Leann T, RPH   Stopped at 12/26/19 1407  . chlorhexidine (PERIDEX) 0.12 % solution 15 mL  15 mL Mouth Rinse BID Rai, Ripudeep K, MD      . Chlorhexidine Gluconate Cloth 2 % PADS 6 each  6 each Topical Daily Mariel Aloe, MD   6 each at 12/25/19 1000  . fenofibrate tablet 54 mg  54 mg Oral Daily Mariel Aloe, MD   54 mg at 12/25/19 1141  . ferrous sulfate tablet 325 mg  325 mg  Oral Q breakfast Mariel Aloe, MD   325 mg at 12/25/19 [id number]  . Gerhardt's butt cream   Topical BID Mendel Corning, MD   Given at 12/26/19 1635  . insulin aspart (novoLOG) injection 0-6 Units  0-6 Units Subcutaneous Q4H Mariel Aloe, MD      . lip balm (CARMEX) ointment   Topical PRN Mariel Aloe, MD      . LORazepam (ATIVAN) tablet 0.5 mg  0.5 mg Oral Q6H PRN Mariel Aloe, MD      . magic mouthwash w/lidocaine  5 mL Oral QID PRN Rai, Ripudeep K, MD      . MEDLINE mouth rinse  15 mL Mouth  Rinse q12n4p Rai, Ripudeep K, MD   15 mL at 12/26/19 1635  . Melatonin TABS 6 mg  6 mg Oral QHS PRN [name], NP   6 mg at 12/20/19 2106  . methocarbamol (ROBAXIN) tablet 750 mg  750 mg Oral TID Mariel Aloe, MD   750 mg at 12/25/19 1139  . methylPREDNISolone sodium succinate (SOLU-MEDROL) 125 mg/2 mL injection 60 mg  60 mg Intravenous Q8H Mariel Aloe, MD   60 mg at 12/26/19 1337  . ondansetron (ZOFRAN) 8 mg in sodium chloride 0.9 % 50 mL IVPB  8 mg Intravenous Q8H PRN Mariel Aloe, MD   Stopped at 12/25/19 0845  . oxyCODONE (Oxy IR/ROXICODONE) immediate release tablet 5-10 mg  5-10 mg Oral Q6H PRN Mariel Aloe, MD   5 mg at 12/24/19 2107  . pantoprazole (PROTONIX) injection 40 mg  40 mg Intravenous Q12H [name] IV, MD   40 mg at 12/26/19 1051  . pravastatin (PRAVACHOL) tablet 40 mg  40 mg Oral Daily Mariel Aloe, MD   40 mg at 12/25/19 1140  . prochlorperazine (COMPAZINE) tablet 10 mg  10 mg Oral Q6H PRN Mariel Aloe, MD      . sodium chloride flush (NS) 0.9 % injection 10-40 mL  10-40 mL Intracatheter Q12H [name] IV, MD   10 mL at 12/26/19 1053  . sodium chloride flush (NS) 0.9 % injection 10-40 mL  10-40 mL Intracatheter PRN Narda Rutherford T IV, MD   10 mL at 12/25/19 2148    Vitals:   12/26/19 1500 12/26/19 1600  BP: 110/65 (!) 104/47  Pulse: (!) 115 (!) 111  Resp: (!) 24 (!) 21  Temp:    SpO2: 97% 98%   Filed Weights   12/25/19 1800  Weight: 179 lb 10.8 oz (81.5 kg)    GENERAL: chronically ill appearing Caucasian male in NAD, somnolent.  SKIN: skin color, texture, turgor are normal, no rashes or significant lesions EYES: conjunctiva are pink and non-injected, sclera clear LUNGS: clear to auscultation and percussion with normal breathing effort HEART: regular rate & rhythm and no murmurs and no lower extremity edema ABDOMEN: soft mildly-distended, normal bowel sounds.   Musculoskeletal: no cyanosis of digits and no clubbing  PSYCH: alert  & oriented x 3, fluent speech NEURO: no focal motor/sensory deficits  LABORATORY DATA:  I have reviewed the data as listed CBC Latest Ref Rng & Units 12/26/2019 12/25/2019 12/24/2019  WBC 4.0 - 10.5 K/uL 0.3(LL) 0.1(LL) 0.1(LL)  Hemoglobin 13.0 - 17.0 g/dL 9.2(L) 10.2(L) 10.8(L)  Hematocrit 39.0 - 52.0 % 30.7(L) 33.2(L) 36.2(L)  Platelets 150 - 400 K/uL 15(LL) 25(LL) 50(L)    CMP Latest Ref Rng & Units 12/26/2019 12/25/2019 12/24/2019  Glucose 70 -  99 mg/dL - 110(H) 107(H)  BUN 6 - 20 mg/dL - 20 22(H)  Creatinine 0.61 - 1.24 mg/dL 0.54(L) 0.51(L) 0.53(L)  Sodium 135 - 145 mmol/L - 134(L) 133(L)  Potassium 3.5 - 5.1 mmol/L - 4.2 4.2  Chloride 98 - 111 mmol/L - 108 107  CO2 22 - 32 mmol/L - 19(L) 19(L)  Calcium 8.9 - 10.3 mg/dL - 7.2(L) 7.5(L)  Total Protein 6.5 - 8.1 g/dL - - -  Total Bilirubin 0.3 - 1.2 mg/dL - - -  Alkaline Phos 38 - 126 U/L - - -  AST 15 - 41 U/L - - -  ALT 0 - 44 U/L - - -     ASSESSMENT & PLAN Kenneth Khan. 54 y.o. male with medical history significant for metastatic squamous cell cancer of unclear origin who was admitted for pain control and marked deconditioning. In the interim since his last visit he began chemotherapy on 12/17/2019. His chemotherapy regimen consists of Carbo/Paclitaxel.   I am concerned about the rapid change in the patient's condition from our last visit 2 days ago.  He is now markedly more somnolent with hypotension and tachycardia requiring ICU transfer.  Fortunately on exam today he had a map of 66 with a heart rate of 112 with no pressor support.  I do agree with the administration of broad-spectrum antibiotics and I think it is reasonable to have palliative care consulted giving his escalation level of care and his overall poor prognosis.  I am concerned also that due to his neutropenia he may have acquired infection.  Furthermore I would recommend a CMP be performed with tomorrow's labs given his markedly low albumin, anasarca, and  confusion.  I am concerned about possible liver dysfunction being the source of his confusion and hypotension.  Fortunately the patient notes he is no longer having abdominal pain which makes me less suspicious of peptic ulcer disease induced by steroid therapy.  We will continue to monitor his situation while he is in the ICU.  #Hypotension #Tachycardia --patient transferred to the ICU for marked increase in HR and decreased blood pressure --on exam today MAP 69 with HR 112, no pressor support --sedate on exam with heavy breathing. Able to arouse, patient answers appropriately. --agree with broad spectrum abx in the setting of these findings and his neutropenia --agree with consult to palliative care given his rapid change in status and condition  # Deconditioning/Unsafe at Home --patient is no longer functionally ambulatory and requires complete assistance moving from vehicle to wheelchair and the restroom. --his fiancee can no longer safely care for him at home and our services are no longer able to lift him into his vehicle to go home.  --agree to being admitted for easy transport to and from radiation therapy and for receiving physical therapy. He will need to be in house until at least 12/28/2019. --plan for d/c to SNF or Acute rehab once radiation therapy is complete.  #Abdominal Pain/Heartburn, improved --pain is improving, though in the interim he had an increase to stress dose steroids.  --continue protonix 40mg  IV q12H --continue to monitor Hgb closely   #Iron Deficiency Anemia --drops in Hgb and iron levels thought to be 2/2 to tumor in the GI tract, though EGD/colonscopy saw no discrete bleeding lesions --continue PO iron therapy  #Leukocytosis, resolved.  --neutrophilic predominance, consistent with leukocytosis of malignancy and worsened by steroid use --held GCSF therapy in setting of elevated WBC --continue to monitor   #Leukopenia #Thrombocytopenia --  these represent  chemotherapy induced cytopenias. No indication for GCSF now, though will consider addition for next cycle.  --continue to monitor with daily CBC. Transfuse for Plt <10  #Metastatic Squamous Cell Cancer, Uncertain Primary --started Cycle 1 of Carbo/Paclitaxel on 12/17/2019. GCSF shot held due to marked leukocytosis at time it was scheduled.  --last CT scan on 11/03/2019 with PET on 12/04/2019 Will plan for repeat CT scan in March 2020 --RTC prior to Cycle 2 of chemotherapy on 01/07/2020. We will tentatively keep this scheduled, though with his recent decline this may be less likely.    #Constipation, resolved #Diarrhea --agree with holding bowel regimen per primary team  Appreciate the care of ICU team. Oncology will continue to follow while he is inpatient.   [name], MD Department of Hematology/Oncology [place] [facility] Phone: [phone] Pager: [phone] Email: [email or web address]  12/26/2019 4:53 PM

## 2019-12-26 NOTE — Progress Notes (Signed)
CRITICAL VALUE ALERT  Critical Value:  WBC 0.3 & platelets 15  Date & Time Notied:  12/26/19 0500  Provider Notified: [email or web address]-link  Orders Received/Actions taken: continue to monitor

## 2019-12-26 NOTE — Progress Notes (Signed)
PHARMACY - PHYSICIAN COMMUNICATION CRITICAL VALUE ALERT - BLOOD CULTURE IDENTIFICATION (BCID)  Kenneth Khan. is an 54 y.o. 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 He was started on vancomycin and cefepime on 2/9 for sepsis. Three blood cx bottles now positive for enterobacter cloacae complex.  Name of physician (or Provider) Contacted: Dr. Tana Coast  Current antibiotics: vancomycin and cefepime  Changes to prescribed antibiotics recommended:  - d/c vancomycin - continue cefepime   Results for orders placed or performed during the hospital encounter of 12/20/2019  Blood Culture ID Panel (Reflexed) (Collected: 12/25/2019  7:25 PM)  Result Value Ref Range   Enterococcus species NOT DETECTED NOT DETECTED   Listeria monocytogenes NOT DETECTED NOT DETECTED   Staphylococcus species NOT DETECTED NOT DETECTED   Staphylococcus aureus (BCID) NOT DETECTED NOT DETECTED   Streptococcus species NOT DETECTED NOT DETECTED   Streptococcus agalactiae NOT DETECTED NOT DETECTED   Streptococcus pneumoniae NOT DETECTED NOT DETECTED   Streptococcus pyogenes NOT DETECTED NOT DETECTED   Acinetobacter baumannii NOT DETECTED NOT DETECTED   Enterobacteriaceae species DETECTED (A) NOT DETECTED   Enterobacter cloacae complex DETECTED (A) NOT DETECTED   Escherichia coli NOT DETECTED NOT DETECTED   Klebsiella oxytoca NOT DETECTED NOT DETECTED   Klebsiella pneumoniae NOT DETECTED NOT DETECTED   Proteus species NOT DETECTED NOT DETECTED   Serratia marcescens NOT DETECTED NOT DETECTED   Carbapenem resistance NOT DETECTED NOT DETECTED   Haemophilus influenzae NOT DETECTED NOT DETECTED   Neisseria meningitidis NOT DETECTED NOT DETECTED   Pseudomonas aeruginosa NOT DETECTED NOT DETECTED   Candida albicans NOT DETECTED NOT DETECTED   Candida glabrata NOT DETECTED NOT DETECTED   Candida krusei NOT DETECTED NOT DETECTED    Candida parapsilosis NOT DETECTED NOT DETECTED   Candida tropicalis NOT DETECTED NOT DETECTED    Kenneth Khan P 12/26/2019  10:59 AM

## 2019-12-26 NOTE — Consult Note (Signed)
PULMONARY / CRITICAL CARE MEDICINE   NAME:  Kenneth Khan., MRN:  [id number], DOB:  1966/06/04, LOS: 7 ADMISSION DATE:  01/03/2020, CONSULTATION DATE:  12/26/2019  REFERRING MD:  Lonny Prude, ED, CHIEF COMPLAINT:  hypotension  BRIEF HISTORY:    54 year old man with metastatic squamous cell cancer ?GI origin with mets to bone, spine and liver admitted 2/3 for pain control and deconditioning, last chemotherapy 2/1 PCCM consulted for hypotension and tachycardia after returning from radiation therapy 2/9  HISTORY OF PRESENT ILLNESS   54 year old man with metastatic squamous cell cancer who presented in 10/2019 with mets to spine and CT abdomen showing pancreatic mass and liver lesions.  Liver biopsy showed squamous cell carcinoma, EGD and sigmoidoscopy did not show any primary.  PET scan showed mass in pancreas and the liver, hypermetabolic.  He started chemotherapy with carboplatin and paclitaxel on 2/1 and was admitted on 2/3 to [facility] for generalized weakness, pain control and deconditioning.  He has been unable to ambulate due to metastatic spinal lesions  I have reviewed oncology note and hospitalist notes. Back pain apparently improved with narcotic regimen and radiation.  He developed abdominal distention, with x-ray suggesting ileus .  He developed pancytopenia On 2/9 after returning from radiation, he was noted to be tachycardic and hypotensive.  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  CULTURES:  MRSA PCR 2/9 > negative  Blood culture 2/9 >   ANTIBIOTICS:  Cefepime 2/9 > Vancomycin 2/9 >   LINES/TUBES:  Port 1/4 >   CONSULTANTS:  ONC  SUBJECTIVE:  Lying in bed with mild increased work of breathing, denies any acute complaints.   CONSTITUTIONAL: BP (!) 98/55   Pulse (!) 102   Temp 97.6 F (36.4 C) (Oral)   Resp 14   Wt 81.5 kg   SpO2 100%   BMI 25.78 kg/m   I/O last 3 completed shifts: In: 1835.8 [P.O.:120; I.V.:1000; IV Piggyback:715.8] Out: 2050 [Urine:2050]        PHYSICAL EXAM: General: Chronically ill appearing elderly deconditioned male, in mild respiratory distress HEENT: [place]/AT, mild jaundice  MM pink/moist, PERRL,  Neuro: Will awake to verbal stimuli and answer simple questions, unable to follow detailed commands  CV: s1s2 regular rate and rhythm, no murmur, rubs, or gallops,  PULM: Diminished breath sounds, increased work of breathing, posterior crackles, oxygen saturations 94 to 100% on 2 L GI: soft, bowel sounds active in all 4 quadrants, non-tender, non-distended Extremities: warm/dry, 2+ pitting edema in both UE and LE  Skin: no rashes or lesions   RESOLVED PROBLEM LIST   ASSESSMENT AND PLAN     Sepsis in an immunocompromised patient with pancytopenia  -Patient seen with early signs of sepsis as of 2/9 with mild hypothermia, tachypnea, tachycardia, hypotension, possible source of infection given profound pancytopenia and mild altered mental status.  Source is unknown at this time P:  Remain in ICU for close monitoring Supplemental oxygen to maintain sats greater than 90% Follow culture Continue empiric antibiotics Gentle IV hydration Blood pressure improved with utilization of albumin and IV hydration Has not not not require initiation of pressors thus far, MAP goal greater 65% Procalcitonin elevated at  2.76 Monitor urine output  Pancytopenia  -Patient currently has a WBC of 0.3 and a platelet count of 15, currently receiving chemotherapy P: Continue to trend daily Monitor for signs of bleeding Follow transfusion protocol Transfuse for platelets less than 10 Protective precautions   Hypotension secondary to dehydration and early onset sepsis -Blood pressure improved with utilization of albumin and IV hydration P: Monitor in the ICU setting No indications for initiation of pressors at this time Sepsis treatment as above Can redose with albumin as needed Continue stress dose steroids  Mild increased work of breathing -No apparent signs of acute respiratory failure at this time however patient remains tachypneic with increased accessory muscle use for respiration P: Obtain chest x-ray Close monitoring of the setting If mentation worsens or respiratory distress and ABG Patient remains full code and would desire intubation if needed Encourage frequent pulmonary hygiene Head of bed elevated  Metastatic squamous cell cancer P: Oncology following, managing  Rest of patient's chronic medical conditions managed by primary team   LABS  Glucose Recent Labs  Lab 12/25/19 1645 12/25/19 1706 12/25/19 2002 12/26/19 0035 12/26/19 0423 12/26/19 0717  GLUCAP 61* 81 88 80 79 89    BMET Recent Labs  Lab 12/22/19 1144 12/22/19 1144 12/24/19 0710 12/25/19 1040 12/26/19 0311  NA 135  --  133* 134*  --   K 4.4  --  4.2 4.2  --   CL 105  --  107 108  --   CO2 22  --  19* 19*  --   BUN 31*  --  22* 20  --   CREATININE 0.57*   < > 0.53* 0.51* 0.54*  GLUCOSE 232*  --  107* 110*  --    < > = values in this interval not displayed.    Liver Enzymes Recent Labs  Lab 12/20/19 0654 12/25/19 1040  AST 103*  --   ALT 83*  --   ALKPHOS 455*  --   BILITOT 4.2*  --   ALBUMIN 2.1* 1.5*    Electrolytes Recent Labs  Lab 12/21/19 0527 12/21/19 0527 12/22/19 1144  12/24/19 0710 12/25/19 1040  CALCIUM 7.9*   < > 7.8* 7.5* 7.2*  MG 2.6*  --  2.1 1.9  --   PHOS  --   --   --   --  2.5   < > = values in this interval not displayed.    CBC Recent Labs  Lab 12/24/19 0710 12/25/19 1040 12/26/19 0311  WBC 0.1* 0.1* 0.3*  HGB 10.8* 10.2* 9.2*  HCT 36.2* 33.2* 30.7*  PLT 50* 25* 15*   Scheduled Meds: . Chlorhexidine Gluconate Cloth  6 each Topical Daily  . fenofibrate  54 mg Oral Daily  . ferrous sulfate  325 mg Oral Q breakfast  . insulin aspart  0-6 Units Subcutaneous Q4H  . methocarbamol  750 mg Oral TID  . methylPREDNISolone (SOLU-MEDROL) injection  60 mg Intravenous Q8H  . pantoprazole (PROTONIX) IV  40 mg Intravenous Q12H  . pravastatin  40 mg Oral Daily  . sodium chloride flush  10-40 mL Intracatheter Q12H   Continuous Infusions: . sodium chloride 75 mL/hr at 12/25/19 2148  .  ceFEPime (MAXIPIME) IV Stopped (12/26/19 [id number])  . ondansetron (ZOFRAN) IV Stopped (12/25/19 0845)  . vancomycin 1,500 mg (12/26/19 0810)   PRN Meds:.acetaminophen **OR** acetaminophen, alum & mag hydroxide-simeth **AND** [COMPLETED] lidocaine, calcium carbonate, lip balm, LORazepam, Melatonin, ondansetron (ZOFRAN) IV, oxyCODONE, prochlorperazine, sodium chloride flush   ABG No results for input(s): PHART, PCO2ART, PO2ART in the last 168 hours.  Coag's Recent Labs  Lab 12/20/19 0654  INR 1.3*    Sepsis Markers Recent Labs  Lab 12/25/19 1835 12/25/19 2119 12/26/19 0311  LATICACIDVEN 1.9 1.8  --   PROCALCITON 2.14  --  2.76    Cardiac Enzymes No results for input(s): TROPONINI, PROBNP in the last 168 hours.  CRITICAL CARE Performed by: Johnsie Cancel   Total critical care time: 40 minutes  Critical care time was exclusive of separately billable procedures and treating other patients.  Critical care was necessary to treat or prevent imminent or life-threatening deterioration.  Critical care was time spent personally by me on the following  activities: development of treatment plan with patient and/or surrogate as well as nursing, discussions with consultants, evaluation of patient's response to treatment, examination of patient, obtaining history from patient or surrogate, ordering and performing treatments and interventions, ordering and review of laboratory studies, ordering and review of radiographic studies, pulse oximetry and re-evaluation of patient's condition.  Johnsie Cancel, NP-C [facility] Contact / Pager information can be found on Amion  12/26/2019, 9:07 AM

## 2019-12-27 ENCOUNTER — Telehealth: Payer: Self-pay | Admitting: *Deleted

## 2019-12-27 ENCOUNTER — Inpatient Hospital Stay (HOSPITAL_COMMUNITY): Payer: Managed Care, Other (non HMO)

## 2019-12-27 ENCOUNTER — Ambulatory Visit: Payer: Managed Care, Other (non HMO)

## 2019-12-27 DIAGNOSIS — J90 Pleural effusion, not elsewhere classified: Secondary | ICD-10-CM

## 2019-12-27 LAB — COMPREHENSIVE METABOLIC PANEL
ALT: 96 U/L — ABNORMAL HIGH (ref 0–44)
AST: 62 U/L — ABNORMAL HIGH (ref 15–41)
Albumin: 2 g/dL — ABNORMAL LOW (ref 3.5–5.0)
Alkaline Phosphatase: 819 U/L — ABNORMAL HIGH (ref 38–126)
Anion gap: 15 (ref 5–15)
BUN: 31 mg/dL — ABNORMAL HIGH (ref 6–20)
CO2: 15 mmol/L — ABNORMAL LOW (ref 22–32)
Calcium: 7.4 mg/dL — ABNORMAL LOW (ref 8.9–10.3)
Chloride: 110 mmol/L (ref 98–111)
Creatinine, Ser: 0.77 mg/dL (ref 0.61–1.24)
GFR calc Af Amer: >60 mL/min (ref >60)
GFR calc non Af Amer: >60 mL/min (ref >60)
Glucose, Bld: 163 mg/dL — ABNORMAL HIGH (ref 70–99)
Potassium: 3.8 mmol/L (ref 3.5–5.1)
Sodium: 140 mmol/L (ref 135–145)
Total Bilirubin: 7.3 mg/dL — ABNORMAL HIGH (ref 0.3–1.2)
Total Protein: 4.5 g/dL — ABNORMAL LOW (ref 6.5–8.1)

## 2019-12-27 LAB — BODY FLUID CELL COUNT WITH DIFFERENTIAL
Lymphs, Fluid: 51 %
Monocyte-Macrophage-Serous Fluid: 40 % — ABNORMAL LOW (ref 50–90)
Neutrophil Count, Fluid: 9 % (ref 0–25)
Total Nucleated Cell Count, Fluid: 20 cu mm (ref 0–1000)

## 2019-12-27 LAB — GLUCOSE, PLEURAL OR PERITONEAL FLUID: Glucose, Fluid: 171 mg/dL

## 2019-12-27 LAB — CBC
HCT: 33.4 % — ABNORMAL LOW (ref 39.0–52.0)
Hemoglobin: 10 g/dL — ABNORMAL LOW (ref 13.0–17.0)
MCH: 22.7 pg — ABNORMAL LOW (ref 26.0–34.0)
MCHC: 29.9 g/dL — ABNORMAL LOW (ref 30.0–36.0)
MCV: 75.9 fL — ABNORMAL LOW (ref 80.0–100.0)
Platelets: 14 10*3/uL — CL (ref 150–400)
RBC: 4.4 MIL/uL (ref 4.22–5.81)
RDW: 23.3 % — ABNORMAL HIGH (ref 11.5–15.5)
WBC: 1.6 10*3/uL — ABNORMAL LOW (ref 4.0–10.5)
nRBC: 0 % (ref 0.0–0.2)

## 2019-12-27 LAB — GLUCOSE, CAPILLARY
Glucose-Capillary: 135 mg/dL — ABNORMAL HIGH (ref 70–99)
Glucose-Capillary: 151 mg/dL — ABNORMAL HIGH (ref 70–99)
Glucose-Capillary: 171 mg/dL — ABNORMAL HIGH (ref 70–99)
Glucose-Capillary: 183 mg/dL — ABNORMAL HIGH (ref 70–99)
Glucose-Capillary: 180 mg/dL — ABNORMAL HIGH (ref 70–99)

## 2019-12-27 LAB — LACTATE DEHYDROGENASE, PLEURAL OR PERITONEAL FLUID: LD, Fluid: 248 U/L — ABNORMAL HIGH (ref 3–23)

## 2019-12-27 LAB — AMYLASE, PLEURAL OR PERITONEAL FLUID: Amylase, Fluid: 11 U/L

## 2019-12-27 LAB — AMYLASE: Amylase: 22 U/L — ABNORMAL LOW (ref 28–100)

## 2019-12-27 LAB — MAGNESIUM: Magnesium: 2.1 mg/dL (ref 1.7–2.4)

## 2019-12-27 LAB — PROTEIN, PLEURAL OR PERITONEAL FLUID: Total protein, fluid: <3 g/dL

## 2019-12-27 LAB — PROCALCITONIN: Procalcitonin: 3.75 ng/mL

## 2019-12-27 LAB — ABO/RH: ABO/RH(D): O POS

## 2019-12-27 LAB — LIPASE, BLOOD: Lipase: 21 U/L (ref 11–51)

## 2019-12-27 MED ORDER — SODIUM CHLORIDE 0.9% IV SOLUTION: Freq: Once | INTRAVENOUS | Status: AC

## 2019-12-27 MED ORDER — ALBUMIN HUMAN 25 % IV SOLN: 25.0000 g | Freq: Once | INTRAVENOUS | Status: AC

## 2019-12-27 MED ORDER — ALBUMIN HUMAN 25 % IV SOLN
INTRAVENOUS | Status: AC
  Administered 2019-12-27: 25 g via INTRAVENOUS
  Filled 2019-12-27: qty 100

## 2019-12-27 NOTE — Progress Notes (Signed)
Encouraged patient to participate in q2h turns. Pt not consistent with staying off back when repositioned. Educated patient on importance of mobility for pressure injury prevention. Pt states understanding. Will continue to reinforce.

## 2019-12-27 NOTE — Procedures (Signed)
Thoracentesis Procedure Note  Pre-operative Diagnosis: Right Side Pleural Effusion   Post-operative Diagnosis: Same  Indications: Right Side Pleural Effusion   Procedure Details  Consent: Informed consent was obtained. Risks of the procedure were discussed including: infection, bleeding, pain, pneumothorax.  Under sterile conditions the patient was positioned. Betadine solution and sterile drapes were utilized.  1% buffered lidocaine was used to anesthetize the 4th rib space. Fluid was obtained without any difficulties and minimal blood loss.  A dressing was applied to the wound and wound care instructions were provided.   Findings 1800 ml of serosanguineous pleural fluid was obtained. A sample was sent to Pathology for cytogenetics, flow, and cell counts, as well as for infection analysis.   Complications:  None; patient tolerated the procedure well.          Condition: stable  Plan A follow up chest x-ray was ordered.  Hayden Pedro, AGACNP-BC [facility]  Pgr: [phone]  PCCM Pgr: [phone]

## 2019-12-27 NOTE — Progress Notes (Signed)
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 Noted to have lab abnormalities. CT Thoracic Spine/Lumbar shows lesion to T1 and compression fracture of T12. CT C/A/Pshowed4 x 5 cm ill-defined hypoechoic mass within the pancreatic tailandinnumerable low-density lesions throughout the liver noted and compatible with metastatic disease. Noted to have microcytic anemia and hypercalcemia to 13.8. 2) 11/05/2019: underwent US guided biopsy of liver lesion. Pathologyconfirmed metastatic squamous cell carcinoma. Requested EGD to confirm esophageal primary. 3) 11/21/2019: patient underwent EGD and sigmoidoscopy. No clear evidence of malignancy noted on these procedures. 4) 12/04/2019: PET CT scan showed mass in pancreas and liver, but no alternative sites of primary tumor.  5) 12/17/2019: Cycle 1 Day 1 of Carboplatin/Paclitaxel  6) 01/03/2020: Admitted to [facility] for worsening functional status.   HISTORY OF PRESENTING ILLNESS:  [facility]. 54 y.o. male with medical history significant for metastatic squamous cell cancer of unclear origin who was admitted for pain control and marked deconditioning. In the interim since his last visit he began chemotherapy on 12/17/2019.  On exam today [facility] is somnolent but awakens briefly.  Nursing reports abdominal pain earlier today and he was given pain medication for this.  When he was able to wake up, he reports that his pain has improved.  He will be getting a platelet transfusion today prior to a thoracentesis.  He is breathing with an open mouth. Our discussion today was limited as he was not able to stay awake for  most of our discussion.  A focused ROS was negative.   MEDICAL HISTORY:  Past Medical History:  Diagnosis Date  . Cancer ([facility])   . Hypertension    ALLERGIES:  has No Known Allergies.  MEDICATIONS:  Current Facility-Administered Medications  Medication Dose Route Frequency Provider Last Rate Last Admin  . 0.9 %  sodium chloride infusion (Manually program via Guardrails IV Fluids)   Intravenous Once [facility]      . acetaminophen (TYLENOL) tablet 650 mg  650 mg Oral Q6H PRN [facility]   650 mg at 12/20/19 2059   Or  . acetaminophen (TYLENOL) suppository 650 mg  650 mg Rectal Q6H PRN [facility]      . alum & mag hydroxide-simeth (MAALOX/MYLANTA) 200-200-20 MG/5ML suspension 30 mL  30 mL Oral Q6H PRN [facility]      . calcium carbonate (TUMS - dosed in mg elemental calcium) chewable tablet 200 mg of elemental calcium  1 tablet Oral TID WC PRN [facility]   200 mg of elemental calcium at 12/27/19 0913  . ceFEPIme (MAXIPIME) 2 g in sodium chloride 0.9 % 100 mL IVPB  2 g Intravenous Q8H [facility]   Stopped at 12/27/19 [id number]  . chlorhexidine (PERIDEX) 0.12 % solution 15 mL  15 mL Mouth Rinse BID [facility]   15 mL at 12/27/19 0909  . Chlorhexidine Gluconate Cloth 2 % PADS 6 each  6 each Topical Daily [facility]   6 each at 12/27/19 0915  . fenofibrate tablet 54 mg  54 mg Oral Daily [facility]   54 mg at 12/27/19 0905  .  ferrous sulfate tablet 325 mg  325 mg Oral Q breakfast [facility]   325 mg at 12/27/19 0727  . [facility]'s butt cream   Topical BID Kenneth [facility]ning, Khan   Given at 12/27/19 [phone]  . insulin aspart (novoLOG) injection 0-6 Units  0-6 Units Subcutaneous Q4H [facility]   1 Units at 12/27/19 [phone]  . lip balm (CARMEX) ointment   Topical PRN [facility]      . LORazepam (ATIVAN) tablet 0.5 mg  0.5 mg Oral Q6H PRN [facility]      . magic mouthwash w/lidocaine  5 mL  Oral QID PRN [facility]      . MEDLINE mouth rinse  15 mL Mouth Rinse q12n4p [facility]   15 mL at 12/26/19 1635  . Melatonin TABS 6 mg  6 mg Oral QHS PRN [facility]   6 mg at 12/20/19 2106  . methocarbamol (ROBAXIN) tablet 750 mg  750 mg Oral TID [facility]   750 mg at 12/27/19 0905  . methylPREDNISolone sodium succinate (SOLU-MEDROL) 125 mg/2 mL injection 60 mg  60 mg Intravenous Q8H [facility]   60 mg at 12/27/19 0543  . ondansetron (ZOFRAN) 8 mg in sodium chloride 0.9 % 50 mL IVPB  8 mg Intravenous Q8H PRN [facility] 216 mL/hr at 12/27/19 0901 8 mg at 12/27/19 0901  . oxyCODONE (Oxy IR/ROXICODONE) immediate release tablet 5-10 mg  5-10 mg Oral Q6H PRN [facility]   10 mg at 12/27/19 0727  . pantoprazole (PROTONIX) injection 40 mg  40 mg Intravenous Q12H [facility] IV, Khan   40 mg at 12/27/19 0902  . pravastatin (PRAVACHOL) tablet 40 mg  40 mg Oral Daily [facility]   40 mg at 12/27/19 0905  . prochlorperazine (COMPAZINE) tablet 10 mg  10 mg Oral Q6H PRN [facility]      . sodium chloride flush (NS) 0.9 % injection 10-40 mL  10-40 mL Intracatheter Q12H [facility] IV, Khan   10 mL at 12/27/19 0916  . sodium chloride flush (NS) 0.9 % injection 10-40 mL  10-40 mL Intracatheter PRN [facility] IV, Khan   10 mL at 12/25/19 2148    Vitals:   12/27/19 1000 12/27/19 1100  BP: 102/62 103/69  Pulse: (!) 105 (!) 105  Resp: 20 17  Temp:    SpO2: 95% 93%   Filed Weights   12/25/19 1800  Weight: 179 lb 10.8 oz (81.5 kg)    GENERAL: chronically ill appearing Caucasian male in NAD, somnolent.  SKIN: skin color, texture, turgor are normal, no rashes or significant lesions EYES: conjunctiva are pink and non-injected, sclera clear NEURO: no focal motor/sensory deficits  LABORATORY DATA:  I have reviewed the data as listed CBC Latest Ref Rng & Units 12/27/2019 12/26/2019 12/25/2019  WBC 4.0 - 10.5 Khan/uL 1.6(L)  0.3(LL) 0.1(LL)  Hemoglobin 13.0 - 17.0 g/dL 10.0(L) 9.2(L) 10.2(L)  Hematocrit 39.0 - 52.0 % 33.4(L) 30.7(L) 33.2(L)  Platelets 150 - 400 Khan/uL 14(LL) 15(LL) 25(LL)    CMP Latest Ref Rng & Units 12/27/2019 12/26/2019 12/25/2019  Glucose 70 - 99 mg/dL 163(H) - 110(H)  BUN 6 - 20 mg/dL 31(H) - 20  Creatinine 0.61 - 1.24 mg/dL 0.77 0.54(L) 0.51(L)  Sodium 135 - 145 mmol/L 140 - 134(L)  Potassium 3.5 -  5.1 mmol/L 3.8 - 4.2  Chloride 98 - 111 mmol/L 110 - 108  CO2 22 - 32 mmol/L 15(L) - 19(L)  Calcium 8.9 - 10.3 mg/dL 7.4(L) - 7.2(L)  Total Protein 6.5 - 8.1 g/dL 4.5(L) - -  Total Bilirubin 0.3 - 1.2 mg/dL 7.3(H) - -  Alkaline Phos 38 - 126 U/L 819(H) - -  AST 15 - 41 U/L 62(H) - -  ALT 0 - 44 U/L 96(H) - -     ASSESSMENT & PLAN [facility]. 54 y.o. male with medical history significant for metastatic squamous cell cancer of unclear origin who was admitted for pain control and marked deconditioning. In the interim since his last visit he began chemotherapy on 12/17/2019. His chemotherapy regimen consists of Carbo/Paclitaxel.   The patient has had a rapid decline in his condition since admission. He is now markedly more somnolent and developed hypotension and tachycardia requiring ICU transfer.  He is not currently on pressors.  Blood cultures positive for Enterobacter and he remains on IV antibiotics.  He remains afebrile.  Given his abdominal pain, will add on amylase and lipase today.  We will continue to monitor his situation while he is in the ICU.  #Hypotension #Tachycardia --patient transferred to the ICU for marked increase in HR and decreased blood pressure --Not requiring pressor support --sedate on exam with heavy breathing. Able to arouse, patient answers appropriately. --On IV antibiotics given neutropenia and positive blood cultures --agree with consult to palliative care given his rapid change in status and condition  # Deconditioning/Unsafe at Home --patient is no  longer functionally ambulatory and requires complete assistance moving from vehicle to wheelchair and the restroom. --his fiancee can no longer safely care for him at home and our services are no longer able to lift him into his vehicle to go home.  --agree to being admitted for easy transport to and from radiation therapy and for receiving physical therapy. He will need to be in house until at least 12/28/2019. --plan for d/c to SNF or Acute rehab once radiation therapy is complete.  #Abdominal Pain/Heartburn --pain is improving, though in the interim he had an increase to stress dose steroids.  --continue protonix 40mg  IV q12H --continue to monitor Hgb closely  --We will add on amylase and lipase  #Iron Deficiency Anemia --drops in Hgb and iron levels thought to be 2/2 to tumor in the GI tract, though EGD/colonscopy saw no discrete bleeding lesions --continue PO iron therapy  #Leukocytosis, resolved.  --neutrophilic predominance, consistent with leukocytosis of malignancy and worsened by steroid use --held GCSF therapy in setting of elevated WBC --continue to monitor   #Leukopenia #Thrombocytopenia --these represent chemotherapy induced cytopenias. No indication for GCSF now, though will consider addition for next cycle.  --continue to monitor with daily CBC. Transfuse for Plt <10 --Will receive a platelet transfusion today due to pending thoracentesis.  #Metastatic Squamous Cell Cancer, Uncertain Primary --started Cycle 1 of Carbo/Paclitaxel on 12/17/2019. GCSF shot held due to marked leukocytosis at time it was scheduled.  --last CT scan on 11/03/2019 with PET on 12/04/2019 Will plan for repeat CT scan in March 2020 --RTC prior to Cycle 2 of chemotherapy on 01/07/2020. We will tentatively keep this scheduled, though with his recent decline this may be less likely.    #Constipation, resolved #Diarrhea --agree with holding bowel regimen per primary team  #Large right pleural effusion  and moderate to large left pleural effusion --For thoracentesis later today.  Appreciate the  care of ICU team. Oncology will continue to follow while he is inpatient.   Mikey Bussing, DNP, AGPCNP-BC, AOCNP   12/27/2019 11:56 AM

## 2019-12-27 NOTE — Telephone Encounter (Signed)
Received vm message from pt's fiancee', [name].  Returned call to her.  She states she has some FMLA forms that need to be completed for pt. Advised for her to drop them off at the cancer center front desk to my attention. Advised that I would get them to the nurse who deals with the FMLA forms.  [name] stated she would drop them off this afternoon or tomorrow morning.

## 2019-12-27 NOTE — Progress Notes (Signed)
[facility]                                                                              Patient Demographics  Kenneth Khan, is a 54 y.o. male, DOB - 01/08/1966, LX:[id number]  Admit date - 12/24/2019   Admitting Physician Mariel Aloe, MD  Outpatient Primary MD for the patient is Nicholes Rough, PA-C  Outpatient specialists:   LOS - 8  days   Medical records reviewed and are as summarized below:    No chief complaint on file.      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  Patient presented from his oncologist office secondary to severe functional debility. 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  Liver biopsy showed metastatic squamous cell carcinoma.  PET/CT 11/2019 showed mass in pancreas and liver -currently on chemotherapy and radiation therapy, and date planned on 2/12 -Chemotherapy with carboplatin/paclitaxel started on 2/1. -Unfortunately has severe leukopenia, thrombocytopenia, likely chemotherapy induced cytopenias.  Per oncology, transfuse for platelets less than 10 -Platelets 14 K, WBC count improving, oncology following, appreciate recommendations -Transfuse platelets pheresis   Severe generalized debility -In the setting of metastatic cancer, spinal lesions, poor functional status.  PT OT recommended CIR when able. -Back pain improving with narcotic regimen and XRT, encourage mobility  Hypotension, tachycardia, SIRS -Transfer to SDU on 2/9 for hypotension, sepsis -Patient was placed on albumin 5%  for resuscitation, empiric broad-spectrum antibiotics with vancomycin and cefepime, stress dose steroids -BP still soft, CCM following  Abdominal distention/ileus - Abdominal x-ray on 2/9 showed few air-filled distended small bowel loops in the mid abdomen, question ileus.   - Feels better, constipation resolved.   -Start on clear liquid diet and advance as tolerated  Mild respiratory distress, large b/l pleural effusions, anasarca -Patient appears to have mild increased work of breathing however no acute respiratory failure, remains tachypneic -Currently full code, given all the above, have requested palliative medicine for establishing goals of care. -Chest x-ray 2/10 shows large right pleural effusion and moderate to large left pleural effusion with associated passive atelectasis and/or pneumonia involving the lower lobes -CCM following, plan for thoracentesis later today after platelet transfusion  Cytopenias with leukopenia, thrombocytopenia -Unfortunately has developed severe cytopenia secondary to chemotherapy. -Transfusing platelet pheresis  Transaminitis, elevated alkaline phosphatase -Likely secondary to metastatic disease  Iron deficiency anemia Likely secondary to blood loss from active malignancy, continue iron supplementation  History of essential hypertension -Currently BP soft, off antihypertensives  Pressure injury: POA Buttocks right, left, mid stage II Bilateral heels, stage I  -Wound care per nursing   Code Status: Full code DVT Prophylaxis: SCD Family Communication: Discussed all imaging results, lab results, explained to the patient's fiancee on phone   Disposition Plan: Patient from  home, unable to return home with severe functional debility, needs to complete XRT on 2/12.  If respiratory status improves and able to endure PT, will need CIR Time Spent in minutes   35 minutes  Procedures:  None  Consultants:   *Oncology CCM CIR Palliative  medicine  Antimicrobials:   Anti-infectives (From admission, onward)   Start     Dose/Rate Route Frequency Ordered Stop   12/26/19 0800  vancomycin (VANCOREADY) IVPB 1500 mg/300 mL  Status:  Discontinued     1,500 mg 150 mL/hr over 120 Minutes Intravenous Every 12 hours 12/25/19 1902 12/26/19 1105   12/25/19 1900  ceFEPIme (MAXIPIME) 2 g in sodium chloride 0.9 % 100 mL IVPB     2 g 200 mL/hr over 30 Minutes Intravenous Every 8 hours 12/25/19 1844     12/25/19 1900  vancomycin (VANCOREADY) IVPB 1750 mg/350 mL     1,750 mg 175 mL/hr over 120 Minutes Intravenous  Once 12/25/19 1845 12/25/19 2347         Medications  Scheduled Meds: . sodium chloride   Intravenous Once  . chlorhexidine  15 mL Mouth Rinse BID  . Chlorhexidine Gluconate Cloth  6 each Topical Daily  . fenofibrate  54 mg Oral Daily  . ferrous sulfate  325 mg Oral Q breakfast  . Gerhardt's butt cream   Topical BID  . insulin aspart  0-6 Units Subcutaneous Q4H  . mouth rinse  15 mL Mouth Rinse q12n4p  . methocarbamol  750 mg Oral TID  . methylPREDNISolone (SOLU-MEDROL) injection  60 mg Intravenous Q8H  . pantoprazole (PROTONIX) IV  40 mg Intravenous Q12H  . pravastatin  40 mg Oral Daily  . sodium chloride flush  10-40 mL Intracatheter Q12H   Continuous Infusions: . ceFEPime (MAXIPIME) IV Stopped (12/27/19 [id number])  . ondansetron [facility]) IV Stopped (12/27/19 0916)   PRN Meds:.acetaminophen **OR** acetaminophen, alum & mag hydroxide-simeth **AND** [COMPLETED] lidocaine, calcium carbonate, lip balm, LORazepam, magic mouthwash w/lidocaine, Melatonin, ondansetron (ZOFRAN) IV, oxyCODONE, prochlorperazine, sodium chloride flush      Subjective:   Maddox Marchesano was seen and examined today.  Overall feeling much more alert and awake today, constipation resolved.  Still has some tachypnea at the time of my examination.  Pain 5/10.  No fevers.  Objective:   Vitals:   12/27/19 1200 12/27/19 1219 12/27/19 1238 12/27/19  1247  BP: (!) 80/56 (!) 92/56 (!) 91/56 96/61  Pulse: (!) 106 (!) 106 (!) 106 (!) 107  Resp: 15 16 16 16   Temp: (!) 96.1 F (35.6 C)  (!) 97.4 F (36.3 C) (!) 97.3 F (36.3 C)  TempSrc: Axillary  Oral Axillary  SpO2: 95% 94% 95% 96%  Weight:      Height:        Intake/Output Summary (Last 24 hours) at 12/27/2019 1251 Last data filed at 12/27/2019 1050 Gross per 24 hour  Intake 1054.66 ml  Output 1300 ml  Net -245.34 ml     Wt Readings from Last 3 Encounters:  12/25/19 81.5 kg  11/19/19 81.5 kg  11/14/19 82.7 kg   Physical Exam  General: Alert and oriented x 3, NAD, ill-appearing, deconditioned  Cardiovascular: S1 S2 clear, no murmurs, RRR.   Respiratory: Decreased breath sound at the bases, R>L  Gastrointestinal: Soft, nontender, nondistended, NBS  Ext: 1-2+ pedal edema bilaterally  Neuro: no new deficits  Musculoskeletal: No cyanosis, clubbing  Skin: No rashes  Psych: Normal affect and demeanor, alert and oriented x3  Data Reviewed:  I have personally reviewed following labs and imaging studies  Micro Results Recent Results (from the past 240 hour(s))  Resp Panel by RT PCR (RSV, Flu A&B, Covid) -     Status: None   Collection Time: 12/18/2019  3:20 PM  Result Value Ref Range Status   SARS Coronavirus 2 by RT PCR NEGATIVE NEGATIVE Final    Comment: (NOTE) SARS-CoV-2 target nucleic acids are NOT DETECTED. The SARS-CoV-2 RNA is generally detectable in upper respiratoy specimens during the acute phase of infection. The lowest concentration of SARS-CoV-2 viral copies this assay can detect is 131 copies/mL. A negative result does not preclude SARS-Cov-2 infection and should not be used as the sole basis for treatment or other patient management decisions. A negative result may occur with  improper specimen collection/handling, submission of specimen other than nasopharyngeal swab, presence of viral mutation(s) within the areas targeted by this assay, and  inadequate number of viral copies (<131 copies/mL). A negative result must be combined with clinical observations, patient history, and epidemiological information. The expected result is Negative. Fact Sheet for Patients:  PinkCheek.be Fact Sheet for Healthcare Providers:  GravelBags.it This test is not yet ap proved or cleared by the [place] FDA and  has been authorized for detection and/or diagnosis of SARS-CoV-2 by FDA under an Emergency Use Authorization (EUA). This EUA will remain  in effect (meaning this test can be used) for the duration of the COVID-19 declaration under Section 564(b)(1) of the Act, 21 U.S.C. section 360bbb-3(b)(1), unless the authorization is terminated or revoked sooner.    Influenza A by PCR NEGATIVE NEGATIVE Final   Influenza B by PCR NEGATIVE NEGATIVE Final    Comment: (NOTE) The Xpert Xpress SARS-CoV-2/FLU/RSV assay is intended as an aid in  the diagnosis of influenza from Nasopharyngeal swab specimens and  should not be used as a sole basis for treatment. Nasal washings and  aspirates are unacceptable for Xpert Xpress SARS-CoV-2/FLU/RSV  testing. Fact Sheet for Patients: PinkCheek.be Fact Sheet for Healthcare Providers: GravelBags.it This test is not yet approved or cleared by the [place] FDA and  has been authorized for detection and/or diagnosis of SARS-CoV-2 by  FDA under an Emergency Use Authorization (EUA). This EUA will remain  in effect (meaning this test can be used) for the duration of the  Covid-19 declaration under Section 564(b)(1) of the Act, 21  U.S.C. section 360bbb-3(b)(1), unless the authorization is  terminated or revoked.    Respiratory Syncytial Virus by PCR NEGATIVE NEGATIVE Final    Comment: (NOTE) Fact Sheet for Patients: PinkCheek.be Fact Sheet for Healthcare  Providers: GravelBags.it This test is not yet approved or cleared by the [place] FDA and  has been authorized for detection and/or diagnosis of SARS-CoV-2 by  FDA under an Emergency Use Authorization (EUA). This EUA will remain  in effect (meaning this test can be used) for the duration of the  COVID-19 declaration under Section 564(b)(1) of the Act, 21 U.S.C.  section 360bbb-3(b)(1), unless the authorization is terminated or  revoked. Performed at [facility], [place] [address] [place], [place] [address]   MRSA PCR Screening     Status: None   Collection Time: 12/25/19  4:37 PM   Specimen: Nasal Mucosa; Nasopharyngeal  Result Value Ref Range Status   MRSA by PCR NEGATIVE NEGATIVE Final    Comment:        The GeneXpert MRSA Assay (FDA approved for NASAL specimens only), is one component  of a comprehensive MRSA colonization surveillance program. It is not intended to diagnose MRSA infection nor to guide or monitor treatment for MRSA infections. Performed at [facility], [place] [address] [place], [place] [address]   Culture, blood (Routine X 2) w Reflex to ID Panel     Status: Abnormal (Preliminary result)   Collection Time: 12/25/19  7:25 PM   Specimen: BLOOD RIGHT ARM  Result Value Ref Range Status   Specimen Description   Final    BLOOD RIGHT ARM Performed at [facility], [place] [address] [place], [place] [address]    Special Requests   Final    BOTTLES DRAWN AEROBIC AND ANAEROBIC Blood Culture adequate volume Performed at [place] [address], [place], [place] [address]    Culture  Setup Time   Final    GRAM NEGATIVE RODS IN BOTH AEROBIC AND ANAEROBIC BOTTLES CRITICAL RESULT CALLED TO, READ BACK BY AND VERIFIED WITH: Seleta Rhymes PharmD 10:55 12/26/19 ([email or web address]) Performed at [facility], [place] [address] [place], [place] [address]    Culture ENTEROBACTER CLOACAE  (A)  Final   Report Status PENDING  Incomplete  Culture, blood (Routine X 2) w Reflex to ID Panel     Status: None (Preliminary result)   Collection Time: 12/25/19  7:25 PM   Specimen: BLOOD RIGHT ARM  Result Value Ref Range Status   Specimen Description   Final    BLOOD RIGHT ARM Performed at [facility], [place] [address], [place], [place] [address]    Special Requests   Final    BOTTLES DRAWN AEROBIC ONLY Blood Culture adequate volume Performed at [place] [address], [place], [place] [address]    Culture  Setup Time   Final    GRAM NEGATIVE RODS AEROBIC BOTTLE ONLY CRITICAL VALUE NOTED.  VALUE IS CONSISTENT WITH PREVIOUSLY REPORTED AND CALLED VALUE. Performed at [facility], [place] [address] [place], [place] [address]    Culture GRAM NEGATIVE RODS  Final   Report Status PENDING  Incomplete  Blood Culture ID Panel (Reflexed)     Status: Abnormal   Collection Time: 12/25/19  7:25 PM  Result Value Ref Range Status   Enterococcus species NOT DETECTED NOT DETECTED Final   Listeria monocytogenes NOT DETECTED NOT DETECTED Final   Staphylococcus species NOT DETECTED NOT DETECTED Final   Staphylococcus aureus (BCID) NOT DETECTED NOT DETECTED Final   Streptococcus species NOT DETECTED NOT DETECTED Final   Streptococcus agalactiae NOT DETECTED NOT DETECTED Final   Streptococcus pneumoniae NOT DETECTED NOT DETECTED Final   Streptococcus pyogenes NOT DETECTED NOT DETECTED Final   Acinetobacter baumannii NOT DETECTED NOT DETECTED Final   Enterobacteriaceae species DETECTED (A) NOT DETECTED Final    Comment: Enterobacteriaceae represent a large family of gram-negative bacteria, not a single organism. CRITICAL RESULT CALLED TO, READ BACK BY AND VERIFIED WITH: Seleta Rhymes PharmD 10:55 12/26/19 ([email or web address])    Enterobacter cloacae complex DETECTED (A) NOT DETECTED Final    Comment: CRITICAL RESULT CALLED TO, READ BACK BY AND VERIFIED WITH: Seleta Rhymes PharmD  10:55 12/26/19 ([email or web address])    Escherichia coli NOT DETECTED NOT DETECTED Final   Klebsiella oxytoca NOT DETECTED NOT DETECTED Final   Klebsiella pneumoniae NOT DETECTED NOT DETECTED Final   Proteus species NOT DETECTED NOT DETECTED Final   Serratia marcescens NOT DETECTED NOT DETECTED Final   Carbapenem resistance NOT DETECTED NOT DETECTED Final  Haemophilus influenzae NOT DETECTED NOT DETECTED Final   Neisseria meningitidis NOT DETECTED NOT DETECTED Final   Pseudomonas aeruginosa NOT DETECTED NOT DETECTED Final   Candida albicans NOT DETECTED NOT DETECTED Final   Candida glabrata NOT DETECTED NOT DETECTED Final   Candida krusei NOT DETECTED NOT DETECTED Final   Candida parapsilosis NOT DETECTED NOT DETECTED Final   Candida tropicalis NOT DETECTED NOT DETECTED Final    Comment: Performed at [facility], [place] [address] [place], [place] [address]    Radiology Reports NM PET Image Initial (PI) Whole Body  Result Date: 12/04/2019 CLINICAL DATA:  Initial treatment strategy for cancer of unknown primary. EXAM: NUCLEAR MEDICINE PET WHOLE BODY TECHNIQUE: 8.89 mCi F-18 FDG was injected intravenously. Full-ring PET imaging was performed from the skull base to thigh after the radiotracer. CT data was obtained and used for attenuation correction and anatomic localization. Fasting blood glucose: 115 mg/dl COMPARISON:  CT chest, abdomen and pelvis from 11/03/2019. FINDINGS: Mediastinal blood pool activity: SUV max 2.37 HEAD/NECK: FDG avid lytic lesion within the right temporal bone measures 1.3 cm and has an SUV max of 9.29. No hypermetabolic lymph nodes within the soft tissues of the neck. Incidental CT findings: none CHEST: Right supraclavicular node measures 9 mm short axis and has an SUV max 4.4. No hypermetabolic axillary, mediastinal, or hilar lymph nodes. Moderate bilateral pleural effusions are identified with overlying passive atelectasis. No hypermetabolic mass within the lungs. Mild  asymmetric increased uptake along the pleura within the posterior costophrenic sulcus is identified with SUV max of 5.28. Incidental CT findings: none ABDOMEN/PELVIS: FDG avid mass within the tail of pancreas measures 6.7 x 5.0 cm and has an SUV max of 9.38. The tumor extends up to and may involve the splenic hilum. Extensive liver metastasis.  Lesions are too numerous to count. Index lesion within left lobe of liver measures 7.4 by 6.2 cm and has an SUV max of 10.2. Posteromedial right hepatic lobe lesion measures 4.2 cm, image 135/4 and has an SUV max of 7.1. The adrenal glands are unremarkable.  No suspicious kidney mass. FDG avid lymph nodes are identified within the upper abdomen. -index peripancreatic node measures 1.4 cm short axis and has an SUV max of 5.9. -index portacaval node measures 1.2 cm within SUV max of 5.16. Small to moderate volume of abdominopelvic ascites identified. Incidental CT findings: Gallstone identified. SKELETON: Multifocal FDG avid bone lesions are noted involving the axial and proximal appendicular skeleton. -expansile, lytic bone lesion with pathologic fracture involving the posterior aspect of the fifth rib measures 2.5 cm and has an SUV max of 8.26. -lucent lesion involving the inferior tip of the body of right scapula measures 1.4 cm with SUV max of 9.1. -lucent lesion within the posterior column of the left acetabulum is also noted within SUV max of 10.24. -lytic lesion involving the L1 vertebra is identified with SUV max of 8.0. Described on MRI from 11/05/2019 is tumor involving T12 and L1 again noted. There has been progression of compression deformity at T12 with FDG avid soft tissue within the canal. This may be of neurologic significance. SUV max within the L1 vertebra is equal to 10.5. Incidental CT findings: none EXTREMITIES: Of potential orthopedic significance is a lytic lesion involving the femoral neck of the proximal left femur measuring 2.2 cm within SUV max of  5.98. also concerning is a 4.3 cm lytic lesion is identified involving the posterior cortex of the proximal diaphysis of the left  femur within SUV max of 9.12 Incidental CT findings: none IMPRESSION: 1. Within the distal tail of pancreas there is a large FDG avid mass worrisome for primary pancreas adenocarcinoma. 2. Extensive FDG avid liver metastasis and upper abdominal nodal metastasis. 3. Moderate bilateral pleural effusions identified. Increased pleural uptake within the left posterior costophrenic sulcus is noted which may reflect underlying malignant effusion. 4. Multifocal FDG avid lytic bone metastases. Progressive compression deformity involving the T12 vertebra is noted and there is increased, FDG avid soft tissue within the canal at this level. This may be of neurologic significance. FDG avid T1 metastasis also noted corresponding to lucent lesion on the CT images. 5. Left proximal femur FDG avid lytic lesions are noted. Although no pathologic fractures present at this time these may be of orthopedic significance in the future. 6. Hypermetabolic lytic lesion is noted involving the right temporal bone which erodes into the mastoid air cells. Electronically Signed   By: Kerby Moors M.D.   On: 12/04/2019 12:03   DG CHEST PORT 1 VIEW  Result Date: 12/26/2019 CLINICAL DATA:  54 year old with current history of hypertension, metastatic pancreatic cancer, presenting with acute onset of shortness of breath. EXAM: PORTABLE CHEST 1 VIEW COMPARISON:  PET-CT 12/04/2019. CT chest 11/03/2019. Chest x-ray 11/03/2019. FINDINGS: Since the prior examinations, interval development of a large RIGHT pleural effusion and a moderate to large LEFT pleural effusion with associated consolidation in the lower lobes. Lungs otherwise clear. Cardiac silhouette normal in size, unchanged. Pulmonary vascularity normal. RIGHT jugular Port-A-Cath tip projects over the LOWER SVC. IMPRESSION: Large RIGHT pleural effusion and moderate  to large LEFT pleural effusion with associated passive atelectasis and/or pneumonia involving the lower lobes. Electronically Signed   By: Evangeline Dakin M.D.   On: 12/26/2019 09:28   DG Abd Portable 1V  Result Date: 12/25/2019 CLINICAL DATA:  Ileus, nausea, vomiting, hypertension, metastatic cancer EXAM: PORTABLE ABDOMEN - 1 VIEW COMPARISON:  Portable exam 0729 hours compared to 12/23/2019 FINDINGS: Scattered gas in colon. Few air-filled distended small bowel loops in mid abdomen. No bowel wall thickening or definite obstruction. Small amount of gas in rectum. Osseous structures unremarkable. IMPRESSION: Few air-filled distended small bowel loops in the mid abdomen question ileus, little changed. Electronically Signed   By: Lavonia Dana M.D.   On: 12/25/2019 09:34   DG Abd Portable 1V  Result Date: 12/23/2019 CLINICAL DATA:  Abdominal distension. EXAM: PORTABLE ABDOMEN - 1 VIEW COMPARISON:  December 21, 2019. FINDINGS: No colonic dilatation is noted. Stable mild small bowel dilatation is noted most consistent with ileus. No radio-opaque calculi or other significant radiographic abnormality are seen. IMPRESSION: Stable mild small bowel dilatation is noted most consistent with ileus. Electronically Signed   By: Marijo Conception M.D.   On: 12/23/2019 09:38   DG Abd Portable 1V  Result Date: 12/21/2019 CLINICAL DATA:  Abdominal distension EXAM: PORTABLE ABDOMEN - 1 VIEW COMPARISON:  11/03/2019 FINDINGS: There are gas distended loops of large and small bowel throughout the abdomen. Stomach is also distended. Small to moderate volume of stool within the right hemicolon. Compression fracture of the T12 vertebral body without definite progression from prior. Pathologic nondisplaced fractures of the posterior and lateral aspects of the right eighth rib. IMPRESSION: 1. Gas distended loops of large and small bowel throughout the abdomen. Findings most suggestive of ileus, although developing small bowel obstruction  not excluded. 2. Pathologic nondisplaced fractures of the posterior and lateral aspects of the right eighth rib. The posterior fracture  component appears acute or subacute. 3. Pathologic compression fracture of the T12 vertebral body, not significantly progressed compared to prior CT. Electronically Signed   By: Davina Poke D.O.   On: 12/21/2019 14:52    Lab Data:  CBC: Recent Labs  Lab 12/23/19 0500 12/24/19 0710 12/25/19 1040 12/26/19 0311 12/27/19 0544  WBC 0.5* 0.1* 0.1* 0.3* 1.6*  NEUTROABS 0.3*  --   --   --   --   HGB 10.8* 10.8* 10.2* 9.2* 10.0*  HCT 36.9* 36.2* 33.2* 30.7* 33.4*  MCV 75.3* 74.3* 74.8* 75.6* 75.9*  PLT 69* 50* 25* 15* 14*   Basic Metabolic Panel: Recent Labs  Lab 12/21/19 0527 12/21/19 0527 12/22/19 1144 12/24/19 0710 12/25/19 1040 12/26/19 0311 12/27/19 0544  NA 134*  --  135 133* 134*  --  140  K 4.7  --  4.4 4.2 4.2  --  3.8  CL 102  --  105 107 108  --  110  CO2 23  --  22 19* 19*  --  15*  GLUCOSE 288*  --  232* 107* 110*  --  163*  BUN 33*  --  31* 22* 20  --  31*  CREATININE 0.64   < > 0.57* 0.53* 0.51* 0.54* 0.77  CALCIUM 7.9*  --  7.8* 7.5* 7.2*  --  7.4*  MG 2.6*  --  2.1 1.9  --   --   --   PHOS  --   --   --   --  2.5  --   --    < > = values in this interval not displayed.   GFR: Estimated Creatinine Clearance: 105.6 mL/min (by C-G formula based on SCr of 0.77 mg/dL). Liver Function Tests: Recent Labs  Lab 12/25/19 1040 12/27/19 0544  AST  --  62*  ALT  --  96*  ALKPHOS  --  819*  BILITOT  --  7.3*  PROT  --  4.5*  ALBUMIN 1.5* 2.0*   Recent Labs  Lab 12/27/19 1038  LIPASE 21  AMYLASE 22*   No results for input(s): AMMONIA in the last 168 hours. Coagulation Profile: No results for input(s): INR, PROTIME in the last 168 hours. Cardiac Enzymes: No results for input(s): CKTOTAL, CKMB, CKMBINDEX, TROPONINI in the last 168 hours. BNP (last 3 results) No results for input(s): PROBNP in the last 8760  hours. HbA1C: No results for input(s): HGBA1C in the last 72 hours. CBG: Recent Labs  Lab 12/26/19 1933 12/26/19 2313 12/27/19 0344 12/27/19 0754 12/27/19 1155  GLUCAP 111* 129* 135* 151* 171*   Lipid Profile: No results for input(s): CHOL, HDL, LDLCALC, TRIG, CHOLHDL, LDLDIRECT in the last 72 hours. Thyroid Function Tests: No results for input(s): TSH, T4TOTAL, FREET4, T3FREE, THYROIDAB in the last 72 hours. Anemia Panel: No results for input(s): VITAMINB12, FOLATE, FERRITIN, TIBC, IRON, RETICCTPCT in the last 72 hours. Urine analysis:    Component Value Date/Time   COLORURINE STRAW (A) 11/04/2019 0744   APPEARANCEUR CLEAR 11/04/2019 0744   LABSPEC 1.009 11/04/2019 [place] 7.0 11/04/2019 [place] 11/04/2019 [place] 11/04/2019 [place] 11/04/2019 [place] 11/04/2019 0744   PROTEINUR NEGATIVE 11/04/2019 0744   NITRITE NEGATIVE 11/04/2019 0744   LEUKOCYTESUR NEGATIVE 11/04/2019 0744     Hyrum Shaneyfelt M.D. [facility] 12/27/2019, 12:51 PM   Call night coverage person covering after 7pm

## 2019-12-27 NOTE — Progress Notes (Signed)
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  Liver biopsy showed squamous cell carcinoma, EGD and sigmoidoscopy did not show any primary.  PET scan showed mass in pancreas and the liver, hypermetabolic.  He started chemotherapy with carboplatin and paclitaxel on 2/1 and was admitted on 2/3 to [facility] long for generalized weakness, pain control and deconditioning.  He has been unable to ambulate due to metastatic spinal lesions  I have reviewed oncology note and hospitalist notes. Back pain apparently improved with narcotic regimen and radiation.  He developed abdominal distention, with x-ray suggesting ileus .  He developed pancytopenia On 2/9 after returning from radiation, he was noted to be tachycardic and hypotensive.  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  CULTURES:  MRSA PCR 2/9 > negative  Blood culture 2/9 > enterobacter cloacae complex  ANTIBIOTICS:  Cefepime 2/9 > Vancomycin 2/9 > 2/10   LINES/TUBES:  Port 1/4 >   CONSULTANTS:  ONC  SUBJECTIVE:  Lying in bed, mild tachypnea   CONSTITUTIONAL: BP (!) 92/44   Pulse (!) 107   Temp (!) 97.2 F (36.2 C) (Axillary)   Resp 20   Ht 5\' 9"  (1.753 m)   Wt 81.5 kg   SpO2 97%   BMI 26.53 kg/m   I/O last 3 completed shifts: In: 3153.3 [P.O.:40; I.V.:1694.4; IV Piggyback:1419] Out: 2950 [Urine:2950]   PHYSICAL EXAM: General: Chronically ill appearing elderly deconditioned male HEENT: dry MM, mild jaundice Neuro: lethargic, arouses with verbal/phsyical stimulation, oriented, follows commands  CV: Tachy, no MRG  PULM: Diminished breath sounds to right lung base, no wheeze/crackles  GI: distended, hypoactive bowel sounds, non-tender  Extremities: warm/dry, 2+ pitting edema in both UE and LE  Skin: no rashes or lesions   RESOLVED PROBLEM LIST   ASSESSMENT AND PLAN     Sepsis in an immunocompromised patient with pancytopenia  -Patient seen with early signs of sepsis as of 2/9  -Blood pressure improved with utilization of albumin and IV hydration Has not not not require initiation of pressors thus far -2/10 bottle cultures positive for enterobacter cloacae complex  P: Supplemental oxygen to maintain sats greater than 90% (Currently on room air)  Vancomycin D/C 2/10, Continue Cefepime per Sensitivities  Gentle IV hydration Maintain MAP >65 (Currently not requiring  pressors)  Continue stress dose steroids Monitor urine output  Pancytopenia  -currently receiving chemotherapy P: Continue to trend daily Monitor for signs of bleeding Follow transfusion protocol Transfuse for platelets less than 10- Will receive 2 units of PLTS prior to [place] today   Protective precautions   Hypoxic Respiratory Distress in setting of bilateral pleural effusions, right greater then left  P: Will perform bedside right side thoracentesis > will send labs with pathology  Titrate supplemental oxygen for saturation goal >92  Encourage frequent pulmonary hygiene Head of bed elevated  Metastatic squamous cell cancer P: Oncology following, managing  Rest of patient's chronic medical conditions managed by primary team  LABS  Glucose Recent Labs  Lab 12/26/19 1536 12/26/19 1933 12/26/19 2313 12/27/19 0344 12/27/19 0754 12/27/19 1155  GLUCAP 100* 111* 129* 135* 151* 171*    BMET Recent Labs  Lab 12/24/19 0710 12/24/19 0710 12/25/19 1040 12/26/19 0311 12/27/19 0544  NA 133*  --  134*  --  140  K 4.2  --  4.2  --  3.8  CL 107  --  108  --  110  CO2 19*  --  19*  --  15*  BUN 22*  --  20  --  31*  CREATININE 0.53*   < > 0.51* 0.54* 0.77  GLUCOSE 107*  --  110*  --  163*   < > = values in this interval not displayed.    Liver Enzymes Recent Labs  Lab 12/25/19 1040 12/27/19 0544  AST  --  62*  ALT  --  96*  ALKPHOS  --  819*  BILITOT  --  7.3*  ALBUMIN 1.5* 2.0*    Electrolytes Recent Labs  Lab 12/21/19 0527 12/21/19 0527 12/22/19 1144 12/22/19 1144 12/24/19 0710 12/25/19 1040 12/27/19 0544  CALCIUM 7.9*   < > 7.8*   < > 7.5* 7.2* 7.4*  MG 2.6*  --  2.1  --  1.9  --   --   PHOS  --   --   --   --   --  2.5  --    < > = values in this interval not displayed.    CBC Recent Labs  Lab 12/25/19 1040 12/26/19 0311 12/27/19 0544  WBC 0.1* 0.3* 1.6*  HGB 10.2* 9.2* 10.0*  HCT 33.2* 30.7* 33.4*  PLT 25* 15* 14*   Scheduled Meds: . chlorhexidine  15 mL Mouth Rinse BID  . Chlorhexidine Gluconate Cloth  6 each Topical Daily  . fenofibrate  54 mg Oral Daily  . ferrous sulfate  325 mg Oral Q breakfast  . Gerhardt's butt cream   Topical BID  . insulin aspart  0-6 Units Subcutaneous Q4H  . mouth rinse  15 mL Mouth  Rinse q12n4p  . methocarbamol  750 mg Oral TID  . methylPREDNISolone (SOLU-MEDROL) injection  60 mg Intravenous Q8H  . pantoprazole (PROTONIX) IV  40 mg Intravenous Q12H  . pravastatin  40 mg Oral Daily  . sodium chloride flush  10-40 mL Intracatheter Q12H   Continuous Infusions: . ceFEPime (MAXIPIME) IV 2 g (12/27/19 1408)  . ondansetron [facility]) IV Stopped (12/27/19 0916)   PRN Meds:.acetaminophen **OR** acetaminophen, alum & mag hydroxide-simeth **AND** [COMPLETED] lidocaine, calcium carbonate, lip balm, LORazepam, magic mouthwash w/lidocaine, Melatonin, ondansetron (ZOFRAN) IV, oxyCODONE, prochlorperazine, sodium chloride flush   ABG No results for input(s): PHART, PCO2ART, PO2ART in the last 168 hours.  Coag's No results for input(s): APTT, INR in the last 168  hours.  Sepsis Markers Recent Labs  Lab 12/25/19 1835 12/25/19 2119 12/26/19 0311 12/27/19 0544  LATICACIDVEN 1.9 1.8  --   --   PROCALCITON 2.14  --  2.76 3.75    Cardiac Enzymes No results for input(s): TROPONINI, PROBNP in the last 168 hours.   Hayden Pedro, AGACNP-BC [facility]  Pgr: [phone]  PCCM Pgr: [phone]

## 2019-12-27 NOTE — Progress Notes (Signed)
OT Cancellation Note  Patient Details Name: Kenneth Khan. MRN: [id number] DOB: 08/01/1966   Cancelled Treatment:    Reason Eval/Treat Not Completed: Other (comment)  Checked on pt 30+ minutes ago.  RN had just given him meds for nausea.  Pt sleeping and noted chest breathing. If schedule permits, I will check back later.  [place] 12/27/2019, 10:12 AM  Karsten Ro, OTR/L Acute Rehabilitation Services 12/27/2019

## 2019-12-28 ENCOUNTER — Ambulatory Visit: Payer: Managed Care, Other (non HMO)

## 2019-12-28 ENCOUNTER — Encounter: Payer: Self-pay | Admitting: Radiation Oncology

## 2019-12-28 ENCOUNTER — Inpatient Hospital Stay (HOSPITAL_COMMUNITY): Payer: Managed Care, Other (non HMO)

## 2019-12-28 DIAGNOSIS — Z9889 Other specified postprocedural states: Secondary | ICD-10-CM

## 2019-12-28 DIAGNOSIS — J9690 Respiratory failure, unspecified, unspecified whether with hypoxia or hypercapnia: Secondary | ICD-10-CM

## 2019-12-28 DIAGNOSIS — J90 Pleural effusion, not elsewhere classified: Secondary | ICD-10-CM | POA: Diagnosis present

## 2019-12-28 LAB — GLUCOSE, CAPILLARY
Glucose-Capillary: 227 mg/dL — ABNORMAL HIGH (ref 70–99)
Glucose-Capillary: 199 mg/dL — ABNORMAL HIGH (ref 70–99)
Glucose-Capillary: 244 mg/dL — ABNORMAL HIGH (ref 70–99)
Glucose-Capillary: 251 mg/dL — ABNORMAL HIGH (ref 70–99)
Glucose-Capillary: 211 mg/dL — ABNORMAL HIGH (ref 70–99)

## 2019-12-28 LAB — CBC
HCT: 29.4 % — ABNORMAL LOW (ref 39.0–52.0)
Hemoglobin: 8.6 g/dL — ABNORMAL LOW (ref 13.0–17.0)
MCH: 22 pg — ABNORMAL LOW (ref 26.0–34.0)
MCHC: 29.3 g/dL — ABNORMAL LOW (ref 30.0–36.0)
MCV: 75.2 fL — ABNORMAL LOW (ref 80.0–100.0)
Platelets: 19 10*3/uL — CL (ref 150–400)
RBC: 3.91 MIL/uL — ABNORMAL LOW (ref 4.22–5.81)
RDW: 23.1 % — ABNORMAL HIGH (ref 11.5–15.5)
WBC: 1.8 10*3/uL — ABNORMAL LOW (ref 4.0–10.5)
nRBC: 0 % (ref 0.0–0.2)

## 2019-12-28 LAB — PREPARE PLATELET PHERESIS
Unit division: 0
Unit division: 0

## 2019-12-28 LAB — PH, BODY FLUID: pH, Body Fluid: 7.7

## 2019-12-28 LAB — COMPREHENSIVE METABOLIC PANEL
ALT: 79 U/L — ABNORMAL HIGH (ref 0–44)
AST: 53 U/L — ABNORMAL HIGH (ref 15–41)
Albumin: 2 g/dL — ABNORMAL LOW (ref 3.5–5.0)
Alkaline Phosphatase: 701 U/L — ABNORMAL HIGH (ref 38–126)
Anion gap: 12 (ref 5–15)
BUN: 38 mg/dL — ABNORMAL HIGH (ref 6–20)
CO2: 17 mmol/L — ABNORMAL LOW (ref 22–32)
Calcium: 7.3 mg/dL — ABNORMAL LOW (ref 8.9–10.3)
Chloride: 113 mmol/L — ABNORMAL HIGH (ref 98–111)
Creatinine, Ser: 0.74 mg/dL (ref 0.61–1.24)
GFR calc Af Amer: >60 mL/min (ref >60)
GFR calc non Af Amer: >60 mL/min (ref >60)
Glucose, Bld: 228 mg/dL — ABNORMAL HIGH (ref 70–99)
Potassium: 3.7 mmol/L (ref 3.5–5.1)
Sodium: 142 mmol/L (ref 135–145)
Total Bilirubin: 6.4 mg/dL — ABNORMAL HIGH (ref 0.3–1.2)
Total Protein: 4.2 g/dL — ABNORMAL LOW (ref 6.5–8.1)

## 2019-12-28 LAB — MAGNESIUM: Magnesium: 2.1 mg/dL (ref 1.7–2.4)

## 2019-12-28 LAB — GLUCOSE, PLEURAL OR PERITONEAL FLUID: Glucose, Fluid: 225 mg/dL

## 2019-12-28 LAB — LACTATE DEHYDROGENASE, PLEURAL OR PERITONEAL FLUID: LD, Fluid: 266 U/L — ABNORMAL HIGH (ref 3–23)

## 2019-12-28 LAB — BPAM PLATELET PHERESIS
Blood Product Expiration Date: 202102122359
Blood Product Expiration Date: 202102132359
ISSUE DATE / TIME: 202102111236
ISSUE DATE / TIME: 202102111313
Unit Type and Rh: 7300
Unit Type and Rh: 7300

## 2019-12-28 LAB — PROTEIN, PLEURAL OR PERITONEAL FLUID: Total protein, fluid: <3 g/dL

## 2019-12-28 LAB — CULTURE, BLOOD (ROUTINE X 2): Special Requests: ADEQUATE

## 2019-12-28 LAB — AMYLASE, PLEURAL OR PERITONEAL FLUID: Amylase, Fluid: 9 U/L

## 2019-12-28 LAB — BODY FLUID CELL COUNT WITH DIFFERENTIAL
Eos, Fluid: 0 %
Lymphs, Fluid: 96 %
Monocyte-Macrophage-Serous Fluid: 0 % — ABNORMAL LOW (ref 50–90)
Neutrophil Count, Fluid: 4 % (ref 0–25)
Total Nucleated Cell Count, Fluid: 49 cu mm (ref 0–1000)

## 2019-12-28 LAB — TRIGLYCERIDES, BODY FLUIDS: Triglycerides, Fluid: 29 mg/dL

## 2019-12-28 LAB — PHOSPHORUS: Phosphorus: 3.1 mg/dL (ref 2.5–4.6)

## 2019-12-28 MED ORDER — ALBUMIN HUMAN 25 % IV SOLN
25.0000 g | Freq: Once | INTRAVENOUS | Status: AC
  Administered 2019-12-28: 25 g via INTRAVENOUS
  Filled 2019-12-28: qty 100

## 2019-12-28 MED ORDER — METHYLPREDNISOLONE SODIUM SUCC 125 MG IJ SOLR
60.0000 mg | Freq: Two times a day (BID) | INTRAMUSCULAR | Status: DC
  Administered 2019-12-28 – 2019-12-30 (×4): 60 mg via INTRAVENOUS
  Filled 2019-12-28 (×4): qty 2

## 2019-12-28 MED ORDER — PANTOPRAZOLE SODIUM 40 MG PO TBEC
40.0000 mg | DELAYED_RELEASE_TABLET | Freq: Two times a day (BID) | ORAL | Status: DC
  Administered 2019-12-28 – 2019-12-31 (×7): 40 mg via ORAL
  Filled 2019-12-28 (×8): qty 1

## 2019-12-28 NOTE — Consult Note (Signed)
Palliative care progress note  Reason for consult: Goals of care in light of metastatic squamous cell carcinoma  Palliative care consult received.  Chart reviewed including personal review of pertinent labs and imaging.  Discussed case with attending as well as bedside staff.  Briefly, Kenneth Khan is a 54 year old male with past medical history of hypertension, anemia, metastatic squamous cell carcinoma currently on FOLFOX and XRT who presented from his oncologist office secondary to severe functional debility.  He has had a complicated clinical course with transfer to the stepdown for hypotension and sepsis with positive blood cultures and development of bilateral pleural effusions.  He is feeling better today after having thoracentesis for 1800 mL yesterday and an additional 800 mL on the contralateral side today.  Palliative care is consulted for goals of care.  I met today with Kenneth Khan and his Kenneth Khan.   I introduced palliative care as specialized medical care for people living with serious illness. It focuses on providing relief from the symptoms and stress of a serious illness. The goal is to improve quality of life for both the patient and the family.  Kenneth Khan is awake and alert but worn out from long day and having radiation therapy.  He does not fully participate in conversation and defers to his fiancee, but will answer specific questions when asked.  Kenneth Khan reports that the physicians have been doing a good job explaining things to them, but she is also scared by how quickly he became ill this admission and reports that she worries at times about asking certain questions as she is scared of the answer she may get.  We gently discussed his illness and working to determine what interventions are most likely to allow him as much quality time as possible based upon his continued clinical course.  She is open to further conversation tomorrow, when Kenneth Khan will hopefully feel  more up to further participation.    - Full code/ Full scope treatment - Kenneth Khan and his fiancee understand that he has uncurable illness but are invested in plan to continue with aggressive care and all offered interventions. - He reports today that his fiancee, Kenneth Khan, should be his surrogate Media planner.  Will request spiritual care to help facilitate completion of documents. - We had very preliminary discussion regarding working on long term goals based upon what is most important to him in light of his medical condition.  He was very tired following radiation and was falling asleep throughout encounter.   - Plan for follow-up meeting with patient and his fiancee tomorrow afternoon.    Time in: 1630 Time out 1750 Total time: 80 minutes  Greater than 50%  of this time was spent counseling and coordinating care related to the above assessment and plan.  Micheline Rough, MD [place] [facility] [phone]

## 2019-12-28 NOTE — Progress Notes (Signed)
[place] Telephone:([phone]) [phone]   [phone]  INPATIENT PROGRESS NOTE  Patient Care Team: Randa Evens as PCP - General (Physician Assistant)  Hematological/Oncological History #Metastatic Squamous Cell Cancer, Unclear Primary. Possible Squamous Cell Of Pancreatic Origin 1) 11/03/2019: presented to [name] after seeing his PCP the day prior. Noted to have lab abnormalities. CT Thoracic Spine/Lumbar shows lesion to T1 and compression fracture of T12. CT C/A/Pshowed4 x 5 cm ill-defined hypoechoic mass within the pancreatic tailandinnumerable low-density lesions throughout the liver noted and compatible with metastatic disease. Noted to have microcytic anemia and hypercalcemia to 13.8. 2) 11/05/2019: underwent US guided biopsy of liver lesion. Pathologyconfirmed metastatic squamous cell carcinoma. Requested EGD to confirm esophageal primary. 3) 11/21/2019: patient underwent EGD and sigmoidoscopy. No clear evidence of malignancy noted on these procedures. 4) 12/04/2019: PET CT scan showed mass in pancreas and liver, but no alternative sites of primary tumor.  5) 12/17/2019: Cycle 1 Day 1 of Carboplatin/Paclitaxel  6) 01/05/2020: Admitted to [facility] for worsening functional status.   HISTORY OF PRESENTING ILLNESS:  Kenneth Khan. 54 y.o. male with medical history significant for metastatic squamous cell cancer of unclear origin who was admitted for pain control and marked deconditioning. In the interim since his last visit he began chemotherapy on 12/17/2019.  On exam today Kenneth Khan is much more awake and interactive.  He is eating breakfast this morning.  He denies having any abdominal pain.  Reports that he felt much better following thoracentesis performed 12/26/2018.  He is hoping for repeat thoracentesis on the other side today.  The remainder of the focused review of systems was negative.  MEDICAL HISTORY:  Past Medical History:  Diagnosis  Date  . Cancer ([place])   . Hypertension    ALLERGIES:  has No Known Allergies.  MEDICATIONS:  Current Facility-Administered Medications  Medication Dose Route Frequency Provider Last Rate Last Admin  . acetaminophen (TYLENOL) tablet 650 mg  650 mg Oral Q6H PRN Mariel Aloe, MD   650 mg at 12/20/19 2059   Or  . acetaminophen (TYLENOL) suppository 650 mg  650 mg Rectal Q6H PRN Mariel Aloe, MD      . albumin human 25 % solution 25 g  25 g Intravenous Once [name], NP      . alum & mag hydroxide-simeth (MAALOX/MYLANTA) 200-200-20 MG/5ML suspension 30 mL  30 mL Oral Q6H PRN Mariel Aloe, MD      . calcium carbonate (TUMS - dosed in mg elemental calcium) chewable tablet 200 mg of elemental calcium  1 tablet Oral TID WC PRN Mariel Aloe, MD   200 mg of elemental calcium at 12/28/19 0908  . ceFEPIme (MAXIPIME) 2 g in sodium chloride 0.9 % 100 mL IVPB  2 g Intravenous Q8H Poindexter, Leann T, RPH   Stopped at 12/28/19 0546  . chlorhexidine (PERIDEX) 0.12 % solution 15 mL  15 mL Mouth Rinse BID Rai, Ripudeep K, MD   15 mL at 12/28/19 1038  . Chlorhexidine Gluconate Cloth 2 % PADS 6 each  6 each Topical Daily Mariel Aloe, MD   6 each at 12/28/19 1100  . fenofibrate tablet 54 mg  54 mg Oral Daily Mariel Aloe, MD   54 mg at 12/28/19 1010  . ferrous sulfate tablet 325 mg  325 mg Oral Q breakfast Mariel Aloe, MD   325 mg at 12/28/19 [id number]  . Gerhardt's butt cream   Topical  BID Mendel Corning, MD   Given at 12/28/19 1104  . insulin aspart (novoLOG) injection 0-6 Units  0-6 Units Subcutaneous Q4H Mariel Aloe, MD   2 Units at 12/28/19 0820  . lip balm (CARMEX) ointment   Topical PRN Mariel Aloe, MD      . LORazepam (ATIVAN) tablet 0.5 mg  0.5 mg Oral Q6H PRN Mariel Aloe, MD   0.5 mg at 12/28/19 0923  . magic mouthwash w/lidocaine  5 mL Oral QID PRN Rai, Ripudeep K, MD      . MEDLINE mouth rinse  15 mL Mouth Rinse q12n4p Rai, Ripudeep K, MD   15 mL at 12/27/19  1652  . Melatonin TABS 6 mg  6 mg Oral QHS PRN [name], NP   6 mg at 12/20/19 2106  . methocarbamol (ROBAXIN) tablet 750 mg  750 mg Oral TID Mariel Aloe, MD   750 mg at 12/28/19 1010  . methylPREDNISolone sodium succinate (SOLU-MEDROL) 125 mg/2 mL injection 60 mg  60 mg Intravenous Q12H Hayden Pedro M, NP      . ondansetron (ZOFRAN) 8 mg in sodium chloride 0.9 % 50 mL IVPB  8 mg Intravenous Q8H PRN Mariel Aloe, MD   Stopped at 12/27/19 [id number]  . oxyCODONE (Oxy IR/ROXICODONE) immediate release tablet 5-10 mg  5-10 mg Oral Q6H PRN Mariel Aloe, MD   10 mg at 12/27/19 0727  . pantoprazole (PROTONIX) EC tablet 40 mg  40 mg Oral BID Leodis Sias T, RPH   40 mg at 12/28/19 1011  . pravastatin (PRAVACHOL) tablet 40 mg  40 mg Oral Daily Mariel Aloe, MD   40 mg at 12/28/19 1011  . prochlorperazine (COMPAZINE) tablet 10 mg  10 mg Oral Q6H PRN Mariel Aloe, MD      . sodium chloride flush (NS) 0.9 % injection 10-40 mL  10-40 mL Intracatheter Q12H [name] IV, MD   10 mL at 12/28/19 1100  . sodium chloride flush (NS) 0.9 % injection 10-40 mL  10-40 mL Intracatheter PRN Narda Rutherford T IV, MD   10 mL at 12/25/19 2148    Vitals:   12/28/19 1000 12/28/19 1100  BP: 99/61 (!) 100/59  Pulse: 99 (!) 107  Resp: 17 (!) 23  Temp: (!) 96.8 F (36 C)   SpO2: 98% 96%   Filed Weights   12/25/19 1800  Weight: 179 lb 10.8 oz (81.5 kg)    GENERAL: chronically ill appearing Caucasian male in NAD, more awake and conversant today.  SKIN: skin color, texture, turgor are normal, no rashes or significant lesions EYES: conjunctiva are pink and non-injected, sclera clear RESP: Diminished breath sounds to the left lung, aeration improved in the right CV: Tachycardic, regular rhythm EXT: 2+ pitting edema in the bilateral upper and lower extremities NEURO: no focal motor/sensory deficits  LABORATORY DATA:  I have reviewed the data as listed CBC Latest Ref Rng & Units 12/28/2019  12/27/2019 12/26/2019  WBC 4.0 - 10.5 K/uL 1.8(L) 1.6(L) 0.3(LL)  Hemoglobin 13.0 - 17.0 g/dL 8.6(L) 10.0(L) 9.2(L)  Hematocrit 39.0 - 52.0 % 29.4(L) 33.4(L) 30.7(L)  Platelets 150 - 400 K/uL 19(LL) 14(LL) 15(LL)    CMP Latest Ref Rng & Units 12/28/2019 12/27/2019 12/26/2019  Glucose 70 - 99 mg/dL 228(H) 163(H) -  BUN 6 - 20 mg/dL 38(H) 31(H) -  Creatinine 0.61 - 1.24 mg/dL 0.74 0.77 0.54(L)  Sodium 135 - 145 mmol/L 142 140 -  Potassium 3.5 - 5.1 mmol/L 3.7 3.8 -  Chloride 98 - 111 mmol/L 113(H) 110 -  CO2 22 - 32 mmol/L 17(L) 15(L) -  Calcium 8.9 - 10.3 mg/dL 7.3(L) 7.4(L) -  Total Protein 6.5 - 8.1 g/dL 4.2(L) 4.5(L) -  Total Bilirubin 0.3 - 1.2 mg/dL 6.4(H) 7.3(H) -  Alkaline Phos 38 - 126 U/L 701(H) 819(H) -  AST 15 - 41 U/L 53(H) 62(H) -  ALT 0 - 44 U/L 79(H) 96(H) -     ASSESSMENT & PLAN Kenneth Khan. 54 y.o. male with medical history significant for metastatic squamous cell cancer of unclear origin who was admitted for pain control and marked deconditioning. In the interim since his last visit he began chemotherapy on 12/17/2019. His chemotherapy regimen consists of Carbo/Paclitaxel.   The patient has had a rapid decline in his condition since admission.  Much more alert and conversant today.  Blood cultures positive for Enterobacter and he remains on IV antibiotics.  He remains afebrile.  Status post right thoracentesis on 12/17/2019 and for possible thoracentesis on the left today.  We will continue to monitor his situation while he is in the ICU.  #Hypotension #Tachycardia --patient transferred to the ICU for marked increase in HR and decreased blood pressure --Not requiring pressor support --More awake and conversant today --On IV antibiotics given neutropenia and positive blood cultures --agree with consult to palliative care given his rapid change in status and condition to discuss goals of care  # Deconditioning/Unsafe at Home --patient is no longer functionally  ambulatory and requires complete assistance moving from vehicle to wheelchair and the restroom. --his fiancee can no longer safely care for him at home and our services are no longer able to lift him into his vehicle to go home.  --agree to being admitted for easy transport to and from radiation therapy and for receiving physical therapy. He will need to be in house until at least 12/28/2019. --plan for d/c to SNF or Acute rehab once radiation therapy is complete.  #Abdominal Pain/Heartburn --pain is improving, though in the interim he had an increase to stress dose steroids.  --continue protonix 40mg  IV q12H --continue to monitor Hgb closely  --Amylase and lipase normal  #Iron Deficiency Anemia --drops in Hgb and iron levels thought to be 2/2 to tumor in the GI tract, though EGD/colonscopy saw no discrete bleeding lesions --continue PO iron therapy  #Leukocytosis, resolved.  --neutrophilic predominance, consistent with leukocytosis of malignancy and worsened by steroid use --held GCSF therapy in setting of elevated WBC --continue to monitor   #Leukopenia #Thrombocytopenia --these represent chemotherapy induced cytopenias. No indication for GCSF now, though will consider addition for next cycle.  --continue to monitor with daily CBC. Transfuse for Plt <10  #Metastatic Squamous Cell Cancer, Uncertain Primary --started Cycle 1 of Carbo/Paclitaxel on 12/17/2019. GCSF shot held due to marked leukocytosis at time it was scheduled.  --last CT scan on 11/03/2019 with PET on 12/04/2019 Will plan for repeat CT scan in March 2020 --RTC prior to Cycle 2 of chemotherapy on 01/07/2020. We will tentatively keep this scheduled, though with his recent decline this may be less likely.    #Constipation, resolved #Diarrhea --agree with holding bowel regimen per primary team  #Large right pleural effusion and moderate to large left pleural effusion --Status post right thoracentesis on 12/27/2019 --For  possible right thoracentesis later today by PCCM.  Appreciate the care of ICU team. Oncology will continue to follow while he is inpatient.  Mikey Bussing, DNP, AGPCNP-BC, AOCNP   12/28/2019 11:25 AM

## 2019-12-28 NOTE — Progress Notes (Addendum)
Pharmacy Antibiotic Note  Kenneth Khan. is a 54 y.o. male admitted on 12/29/2019 with pancreatic mass, SCC of the bone and ambulatory dysfunction.  Patient with hypotension and tachycardia.  Pharmacy has been consulted for Cefepime dosing.  12/28/2019  D#3 full abx. Cefepime for enterobacter cloacae complex bacteremia.  WBC 1.8, SCr 0.47, AF/hypothermic. PCT 3.75 on 2/11 S/p R thoracentesis 2/11   Plan:  Continue Cefepime 2gm IV q8h  IV PPI> Po  Height: 5\' 9"  (175.3 cm) Weight: 179 lb 10.8 oz (81.5 kg) IBW/kg (Calculated) : 70.7  Temp (24hrs), Avg:97.1 F (36.2 C), Min:96.1 F (35.6 C), Max:97.5 F (36.4 C)  Recent Labs  Lab 12/24/19 0710 12/25/19 1040 12/25/19 1835 12/25/19 2119 12/26/19 0311 12/27/19 0544 12/28/19 0358  WBC 0.1* 0.1*  --   --  0.3* 1.6* 1.8*  CREATININE 0.53* 0.51*  --   --  0.54* 0.77 0.74  LATICACIDVEN  --   --  1.9 1.8  --   --   --     Estimated Creatinine Clearance: 105.6 mL/min (by C-G formula based on SCr of 0.74 mg/dL).    No Known Allergies   Antimicrobials this admission:  2/9 vanc >>  2/10 2/9 cefepime >>   Dose adjustments this admission:   Microbiology results:  2/9 BCx x2:  3 bottles with GNR (BCID= enterobacter cloacae complex) R ancef, sens to all other tested  2/9 MRSA PCR: negative  Thank you for allowing pharmacy to be a part of this patient's care.  Eudelia Bunch, Pharm.D [phone] 12/28/2019 7:54 AM

## 2019-12-28 NOTE — Progress Notes (Signed)
OT Cancellation Note  Patient Details Name: Kenneth Khan. MRN: [id number] DOB: 1966-09-11   Cancelled Treatment:    Reason Eval/Treat Not Completed: Other (comment). Pt still with some SOB at rest. For thoracentesis and XRT today. Will check early next week.  Emmabelle Fear 12/28/2019, 8:04 AM  Karsten Ro, OTR/L Acute Rehabilitation Services 12/28/2019

## 2019-12-28 NOTE — Progress Notes (Signed)
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  Liver biopsy showed squamous cell carcinoma, EGD and sigmoidoscopy did not show any primary.  PET scan showed mass in pancreas and the liver, hypermetabolic.  He started chemotherapy with carboplatin and paclitaxel on 2/1 and was admitted on 2/3 to [facility] long for generalized weakness, pain control and deconditioning.  He has been unable to ambulate due to metastatic spinal lesions  I have reviewed oncology note and hospitalist notes. Back pain apparently improved with narcotic regimen and radiation.  He developed abdominal distention, with x-ray suggesting ileus .  He developed pancytopenia On 2/9 after returning from radiation, he was noted to be tachycardic and hypotensive.  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  CULTURES:  MRSA PCR 2/9 > negative  Blood culture 2/9 > enterobacter cloacae complex  ANTIBIOTICS:  Cefepime 2/9 > Vancomycin 2/9 > 2/10   LINES/TUBES:  Port 1/4 >   CONSULTANTS:  ONC  SUBJECTIVE:  No events overnight. Complaining of shortness of breath this AM   CONSTITUTIONAL: BP 111/67   Pulse (!) 108   Temp (!) 97.2 F (36.2 C)   Resp 20   Ht 5\' 9"  (1.753 m)   Wt 81.5 kg   SpO2 96%   BMI 26.53 kg/m   I/O last 3 completed shifts: In: 1156 [P.O.:40; I.V.:33; Blood:537; IV [id number] Out: [id number] [Urine:1625; Other:1800]   PHYSICAL EXAM: General: Chronically ill appearing adult deconditioned male HEENT: dry MM, mild jaundice Neuro: lethargic, arouses with verbal/phsyical stimulation, oriented, follows commands  CV: Tachy, no MRG PULM: much improved aeration to right lung, diminished breath sounds to left no wheeze/crackles  GI: distended, hypoactive bowel sounds, non-tender  Extremities: warm/dry, 2+ pitting edema in both UE and LE  Skin: no rashes or lesions   RESOLVED PROBLEM LIST   ASSESSMENT AND PLAN     Sepsis in an immunocompromised patient with pancytopenia  -Patient seen with early signs of sepsis as of 2/9  -Blood pressure improved with utilization of albumin and IV hydration Has not not not require initiation of pressors thus far -2/10 bottle cultures positive for enterobacter cloacae complex  P: Supplemental oxygen to maintain sats greater than 90% (Currently on room air)  Continue Cefepime per Sensitivities  Gentle IV hydration Maintain  MAP >65 (Currently not requiring pressors)  Continue stress dose steroids >> Decrease from 60 mg q8h to 60 mg BID  Monitor urine output  Pancytopenia  -currently receiving chemotherapy P: Continue to trend daily Monitor for signs of bleeding Follow transfusion protocol Transfuse  for platelets less than 10 Protective precautions   Hypoxic Respiratory Distress in setting of bilateral pleural effusions, right greater then left  S/P Right Thoracentesis 2/11 >>1800 ml output  P: Will perform bedside left side thoracentesis   Titrate supplemental oxygen for saturation goal >92  Encourage frequent pulmonary hygiene Head of bed elevated  Metastatic squamous cell cancer P: Oncology following, managing  Rest of patient's chronic medical conditions managed by primary team. Will sign off at this time. Patient remains hemodynamically stable. Please re-consult if this were to change. Have communicated this with primary attending.    LABS  Glucose Recent Labs  Lab 12/27/19 1155 12/27/19 1624 12/27/19 2000 12/27/19 2329 12/28/19 0346 12/28/19 0750  GLUCAP 171* 183* 180* 227* 199* 244*    BMET Recent Labs  Lab 12/25/19 1040 12/25/19 1040 12/26/19 0311 12/27/19 0544 12/28/19 0358  NA 134*  --   --  140 142  K 4.2  --   --  3.8 3.7  CL 108  --   --  110 113*  CO2 19*  --   --  15* 17*  BUN 20  --   --  31* 38*  CREATININE 0.51*   < > 0.54* 0.77 0.74  GLUCOSE 110*  --   --  163* 228*   < > = values in this interval not displayed.    Liver Enzymes Recent Labs  Lab 12/25/19 1040 12/27/19 0544 12/28/19 0358  AST  --  62* 53*  ALT  --  96* 79*  ALKPHOS  --  819* 701*  BILITOT  --  7.3* 6.4*  ALBUMIN 1.5* 2.0* 2.0*    Electrolytes Recent Labs  Lab 12/24/19 0710 12/24/19 0710 12/25/19 1040 12/27/19 0544 12/27/19 1038 12/28/19 0358  CALCIUM 7.5*   < > 7.2* 7.4*  --  7.3*  MG 1.9  --   --   --  2.1 2.1  PHOS  --   --  2.5  --   --  3.1   < > = values in this interval not displayed.    CBC Recent Labs  Lab 12/26/19 0311 12/27/19 0544 12/28/19 0358  WBC 0.3* 1.6* 1.8*  HGB 9.2* 10.0* 8.6*  HCT 30.7* 33.4* 29.4*  PLT 15* 14* 19*   Scheduled Meds: . chlorhexidine  15 mL Mouth Rinse BID  . Chlorhexidine Gluconate Cloth  6 each Topical  Daily  . fenofibrate  54 mg Oral Daily  . ferrous sulfate  325 mg Oral Q breakfast  . Gerhardt's butt cream   Topical BID  . insulin aspart  0-6 Units Subcutaneous Q4H  . mouth rinse  15 mL Mouth Rinse q12n4p  . methocarbamol  750 mg Oral TID  . methylPREDNISolone (SOLU-MEDROL) injection  60 mg Intravenous Q8H  . pantoprazole  40 mg Oral BID  . pravastatin  40 mg Oral Daily  . sodium chloride flush  10-40 mL Intracatheter Q12H   Continuous Infusions: . ceFEPime (MAXIPIME) IV Stopped (12/28/19 0546)  . ondansetron [facility]) IV Stopped (12/27/19 0916)   PRN Meds:.acetaminophen **OR** acetaminophen, alum & mag hydroxide-simeth **AND** [COMPLETED] lidocaine, calcium carbonate, lip balm, LORazepam, magic mouthwash w/lidocaine, Melatonin, ondansetron (ZOFRAN) IV, oxyCODONE, prochlorperazine, sodium chloride flush  ABG No results for input(s): PHART, PCO2ART, PO2ART in the last 168 hours.  Coag's No results for input(s): APTT, INR in the last 168 hours.  Sepsis Markers Recent Labs  Lab 12/25/19 1835 12/25/19 2119 12/26/19 0311 12/27/19 0544  LATICACIDVEN 1.9 1.8  --   --   PROCALCITON 2.14  --  2.76 3.75    Cardiac Enzymes No results for input(s): TROPONINI, PROBNP in the last 168 hours.   Hayden Pedro, AGACNP-BC [facility]  Pgr: [phone]  PCCM Pgr: [phone]

## 2019-12-28 NOTE — Procedures (Signed)
Thoracentesis Procedure Note  Pre-operative Diagnosis: Left Side Pleural Effusion   Post-operative Diagnosis: Same as above Indications: as above   Procedure Details  Consent: Informed consent was obtained. Risks of the procedure were discussed including: infection, bleeding, pain, pneumothorax.  Under sterile conditions the patient was positioned. Betadine solution and sterile drapes were utilized.  1% buffered lidocaine was used to anesthetize the 4 rib space. Fluid was obtained without any difficulties and minimal blood loss.  A dressing was applied to the wound and wound care instructions were provided.   Findings 800 ml of bloody pleural fluid was obtained. A sample was sent to Pathology for cytogenetics, flow, and cell counts, as well as for infection analysis.  Complications: None.     Plan A follow up chest x-ray was ordered.

## 2019-12-28 NOTE — Progress Notes (Signed)
[facility]                                                                              Patient Demographics  Kenneth Khan, is a 54 y.o. male, DOB - [birth year], LX:[id number]  Admit date - 01/01/2020   Admitting Physician Mariel Aloe, MD  Outpatient Primary MD for the patient is Nicholes Rough, PA-C  Outpatient specialists:   LOS - 9  days   Medical records reviewed and are as summarized below:    No chief complaint on file.      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  Patient presented from his oncologist office secondary to severe functional debility. 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  Liver biopsy showed metastatic squamous cell carcinoma.  PET/CT 11/2019 showed mass in pancreas and liver -currently on chemotherapy and radiation therapy -Chemotherapy with carboplatin/paclitaxel started on 2/1. -Unfortunately has severe leukopenia, thrombocytopenia, likely chemotherapy induced cytopenias.  Per oncology, transfuse for platelets less than 10 -Counts improving today, appreciate oncology conditions -Plan for scheduled radiation today   Severe generalized debility -In the setting of metastatic cancer, spinal lesions, poor functional status.  PT OT recommended CIR when able. -Back pain improving with narcotic regimen and XRT, encourage mobility  Hypotension, tachycardia, SIRS -Transfer to SDU on 2/9 for hypotension, sepsis -Patient was placed on albumin 5% for resuscitation, empiric broad-spectrum  antibiotics with vancomycin and cefepime, stress dose steroids -BP still soft, asymptomatic.  - Decrease stress dose steroids from 60 mg every 8 hours to 60 mg every 12 hours  Abdominal distention/ileus - Abdominal x-ray on 2/9 showed few air-filled distended small bowel loops in the mid abdomen, question ileus.   -Constipation improved, advance diet to carb modified  Mild respiratory distress, large b/l pleural effusions, anasarca -Respiratory status much better after thoracentesis -Chest x-ray 2/10 shows large right pleural effusion and moderate to large left pleural effusion with associated passive atelectasis and/or pneumonia involving the lower lobes -Status post right thoracentesis on 2/11, 1.8 L out, status post left thoracentesis today, 800 cc removed  Cytopenias with leukopenia, thrombocytopenia -Unfortunately has developed severe cytopenia secondary to chemotherapy. -Counts improving, received platelet pheresis on 2/11  Transaminitis, elevated alkaline phosphatase -Likely secondary to metastatic disease  Iron deficiency anemia Likely secondary to blood loss from active malignancy, continue iron supplementation  History of essential hypertension -Off antihypertensives, BP soft  Pressure injury: POA Buttocks right, left, mid stage II Bilateral heels, stage I  -Wound care per nursing   Code Status: Full code DVT Prophylaxis: SCD Family Communication: Discussed all imaging results, lab results, explained to the patient's fiancee on phone    Disposition Plan: Patient from home, unable to return home with severe functional debility.  If respiratory status improves  and able to endure PT, will need CIR  Time Spent in minutes   25 minutes  Procedures:  Right-sided thoracentesis 2/11 Left-sided thoracentesis 2/12  Consultants:   Oncology CCM CIR Palliative medicine  Antimicrobials:   Anti-infectives (From admission, onward)   Start     Dose/Rate Route Frequency  Ordered Stop   12/26/19 0800  vancomycin (VANCOREADY) IVPB 1500 mg/300 mL  Status:  Discontinued     1,500 mg 150 mL/hr over 120 Minutes Intravenous Every 12 hours 12/25/19 1902 12/26/19 1105   12/25/19 1900  ceFEPIme (MAXIPIME) 2 g in sodium chloride 0.9 % 100 mL IVPB     2 g 200 mL/hr over 30 Minutes Intravenous Every 8 hours 12/25/19 1844     12/25/19 1900  vancomycin (VANCOREADY) IVPB 1750 mg/350 mL     1,750 mg 175 mL/hr over 120 Minutes Intravenous  Once 12/25/19 1845 12/25/19 2347         Medications  Scheduled Meds: . chlorhexidine  15 mL Mouth Rinse BID  . Chlorhexidine Gluconate Cloth  6 each Topical Daily  . fenofibrate  54 mg Oral Daily  . ferrous sulfate  325 mg Oral Q breakfast  . Gerhardt's butt cream   Topical BID  . insulin aspart  0-6 Units Subcutaneous Q4H  . mouth rinse  15 mL Mouth Rinse q12n4p  . methocarbamol  750 mg Oral TID  . methylPREDNISolone (SOLU-MEDROL) injection  60 mg Intravenous Q12H  . pantoprazole  40 mg Oral BID  . pravastatin  40 mg Oral Daily  . sodium chloride flush  10-40 mL Intracatheter Q12H   Continuous Infusions: . ceFEPime (MAXIPIME) IV Stopped (12/28/19 0546)  . ondansetron [facility]) IV Stopped (12/27/19 0916)   PRN Meds:.acetaminophen **OR** acetaminophen, alum & mag hydroxide-simeth **AND** [COMPLETED] lidocaine, calcium carbonate, lip balm, LORazepam, magic mouthwash w/lidocaine, Melatonin, ondansetron (ZOFRAN) IV, oxyCODONE, prochlorperazine, sodium chloride flush      Subjective:   Kenneth Khan was seen and examined today.  Overall improving, much more alert and oriented.  Breathing status is improving.  Status post thoracentesis.  Overnight no acute issues.  Pain controlled  Objective:   Vitals:   12/28/19 1000 12/28/19 1100 12/28/19 1145 12/28/19 1200  BP: 99/61 (!) 100/59 (!) 91/55 (!) 92/56  Pulse: 99 (!) 107 (!) 101 (!) 103  Resp: 17 (!) 23 16 17   Temp: (!) 96.8 F (36 C)  (!) 97 F (36.1 C) (!) 97.2 F  (36.2 C)  TempSrc:    Bladder  SpO2: 98% 96% 97% 97%  Weight:      Height:        Intake/Output Summary (Last 24 hours) at 12/28/2019 1308 Last data filed at 12/28/2019 1148 Gross per 24 hour  Intake 1739.84 ml  Output 3380 ml  Net -1640.16 ml     Wt Readings from Last 3 Encounters:  12/25/19 81.5 kg  11/19/19 81.5 kg  11/14/19 82.7 kg   Physical Exam  General: Alert and oriented x 3, NAD  Cardiovascular: S1 S2 clear, RRR. No pedal edema b/l  Respiratory: Decreased breath sounds at bases  Gastrointestinal: Soft, nontender, nondistended, NBS  Ext: 1+ pedal edema bilaterally  Neuro: no new deficits  Musculoskeletal: No cyanosis, clubbing  Skin: No rashes  Psych: Normal affect and demeanor, alert and oriented x3     Data Reviewed:  I have personally reviewed following labs and imaging studies  Micro Results Recent Results (from the past 240 hour(s))  Resp Panel by RT PCR (RSV,  Flu A&B, Covid) -     Status: None   Collection Time: 01/08/2020  3:20 PM  Result Value Ref Range Status   SARS Coronavirus 2 by RT PCR NEGATIVE NEGATIVE Final    Comment: (NOTE) SARS-CoV-2 target nucleic acids are NOT DETECTED. The SARS-CoV-2 RNA is generally detectable in upper respiratoy specimens during the acute phase of infection. The lowest concentration of SARS-CoV-2 viral copies this assay can detect is 131 copies/mL. A negative result does not preclude SARS-Cov-2 infection and should not be used as the sole basis for treatment or other patient management decisions. A negative result may occur with  improper specimen collection/handling, submission of specimen other than nasopharyngeal swab, presence of viral mutation(s) within the areas targeted by this assay, and inadequate number of viral copies (<131 copies/mL). A negative result must be combined with clinical observations, patient history, and epidemiological information. The expected result is Negative. Fact Sheet for  Patients:  PinkCheek.be Fact Sheet for Healthcare Providers:  GravelBags.it This test is not yet ap proved or cleared by the [place] FDA and  has been authorized for detection and/or diagnosis of SARS-CoV-2 by FDA under an Emergency Use Authorization (EUA). This EUA will remain  in effect (meaning this test can be used) for the duration of the COVID-19 declaration under Section 564(b)(1) of the Act, 21 U.S.C. section 360bbb-3(b)(1), unless the authorization is terminated or revoked sooner.    Influenza A by PCR NEGATIVE NEGATIVE Final   Influenza B by PCR NEGATIVE NEGATIVE Final    Comment: (NOTE) The Xpert Xpress SARS-CoV-2/FLU/RSV assay is intended as an aid in  the diagnosis of influenza from Nasopharyngeal swab specimens and  should not be used as a sole basis for treatment. Nasal washings and  aspirates are unacceptable for Xpert Xpress SARS-CoV-2/FLU/RSV  testing. Fact Sheet for Patients: PinkCheek.be Fact Sheet for Healthcare Providers: GravelBags.it This test is not yet approved or cleared by the [place] FDA and  has been authorized for detection and/or diagnosis of SARS-CoV-2 by  FDA under an Emergency Use Authorization (EUA). This EUA will remain  in effect (meaning this test can be used) for the duration of the  Covid-19 declaration under Section 564(b)(1) of the Act, 21  U.S.C. section 360bbb-3(b)(1), unless the authorization is  terminated or revoked.    Respiratory Syncytial Virus by PCR NEGATIVE NEGATIVE Final    Comment: (NOTE) Fact Sheet for Patients: PinkCheek.be Fact Sheet for Healthcare Providers: GravelBags.it This test is not yet approved or cleared by the [place] FDA and  has been authorized for detection and/or diagnosis of SARS-CoV-2 by  FDA under an Emergency Use  Authorization (EUA). This EUA will remain  in effect (meaning this test can be used) for the duration of the  COVID-19 declaration under Section 564(b)(1) of the Act, 21 U.S.C.  section 360bbb-3(b)(1), unless the authorization is terminated or  revoked. Performed at [facility], [place] [address], [place], [place] [address]   MRSA PCR Screening     Status: None   Collection Time: 12/25/19  4:37 PM   Specimen: Nasal Mucosa; Nasopharyngeal  Result Value Ref Range Status   MRSA by PCR NEGATIVE NEGATIVE Final    Comment:        The GeneXpert MRSA Assay (FDA approved for NASAL specimens only), is one component of a comprehensive MRSA colonization surveillance program. It is not intended to diagnose MRSA infection nor to guide or monitor treatment for MRSA infections. Performed at [facility], [place] [address], [place], [place] [address]   Culture, blood (Routine X 2) w Reflex to ID Panel     Status: Abnormal (Preliminary result)   Collection Time: 12/25/19  7:25 PM   Specimen: BLOOD RIGHT ARM  Result Value Ref Range Status   Specimen Description BLOOD RIGHT ARM  Final   Special Requests   Final    BOTTLES DRAWN AEROBIC AND ANAEROBIC Blood Culture adequate volume   Culture  Setup Time   Final    GRAM NEGATIVE RODS IN BOTH AEROBIC AND ANAEROBIC BOTTLES CRITICAL RESULT CALLED TO, READ BACK BY AND VERIFIED WITH: Seleta Rhymes PharmD 10:55 12/26/19 ([email or web address])    Culture ENTEROBACTER CLOACAE (A)  Final   Report Status PENDING  Incomplete   Organism ID, Bacteria ENTEROBACTER CLOACAE  Final      Susceptibility   Enterobacter cloacae - MIC*    CEFAZOLIN >=64 RESISTANT Resistant     CEFEPIME <=0.12 SENSITIVE Sensitive     CEFTAZIDIME <=1 SENSITIVE Sensitive     CIPROFLOXACIN <=0.25 SENSITIVE Sensitive     GENTAMICIN <=1 SENSITIVE Sensitive     IMIPENEM 0.5 SENSITIVE Sensitive     TRIMETH/SULFA <=20 SENSITIVE Sensitive     PIP/TAZO Value in next row  Sensitive      <=4 SENSITIVEPerformed at [facility], [address], [place], [place] [address]    * ENTEROBACTER CLOACAE  Culture, blood (Routine X 2) w Reflex to ID Panel     Status: Abnormal   Collection Time: 12/25/19  7:25 PM   Specimen: BLOOD RIGHT ARM  Result Value Ref Range Status   Specimen Description   Final    BLOOD RIGHT ARM Performed at [facility], [place] [address] [place], [place] [address]    Special Requests   Final    BOTTLES DRAWN AEROBIC ONLY Blood Culture adequate volume Performed at [place] [address], [place], [place] [address]    Culture  Setup Time   Final    GRAM NEGATIVE RODS AEROBIC BOTTLE ONLY CRITICAL VALUE NOTED.  VALUE IS CONSISTENT WITH PREVIOUSLY REPORTED AND CALLED VALUE.    Culture (A)  Final    ENTEROBACTER CLOACAE SUSCEPTIBILITIES PERFORMED ON PREVIOUS CULTURE WITHIN THE LAST 5 DAYS. Performed at [facility], [place] [address] [place], [place] [address]    Report Status 12/28/2019 FINAL  Final  Blood Culture ID Panel (Reflexed)     Status: Abnormal   Collection Time: 12/25/19  7:25 PM  Result Value Ref Range Status   Enterococcus species NOT DETECTED NOT DETECTED Final   Listeria monocytogenes NOT DETECTED NOT DETECTED Final   Staphylococcus species NOT DETECTED NOT DETECTED Final   Staphylococcus aureus (BCID) NOT DETECTED NOT DETECTED Final   Streptococcus species NOT DETECTED NOT DETECTED Final   Streptococcus agalactiae NOT DETECTED NOT DETECTED Final   Streptococcus pneumoniae NOT DETECTED NOT DETECTED Final   Streptococcus pyogenes NOT DETECTED NOT DETECTED Final   Acinetobacter baumannii NOT DETECTED NOT DETECTED Final   Enterobacteriaceae species DETECTED (A) NOT DETECTED Final    Comment: Enterobacteriaceae represent a large family of gram-negative bacteria, not a single organism. CRITICAL RESULT CALLED TO, READ BACK BY AND VERIFIED WITH: Seleta Rhymes PharmD 10:55 12/26/19 ([email or web address])     Enterobacter cloacae complex DETECTED (A) NOT DETECTED Final    Comment: CRITICAL RESULT CALLED TO, READ BACK BY AND VERIFIED WITH: Seleta Rhymes PharmD 10:55 12/26/19 ([email or web address])    Escherichia coli NOT DETECTED  NOT DETECTED Final   Klebsiella oxytoca NOT DETECTED NOT DETECTED Final   Klebsiella pneumoniae NOT DETECTED NOT DETECTED Final   Proteus species NOT DETECTED NOT DETECTED Final   Serratia marcescens NOT DETECTED NOT DETECTED Final   Carbapenem resistance NOT DETECTED NOT DETECTED Final   Haemophilus influenzae NOT DETECTED NOT DETECTED Final   Neisseria meningitidis NOT DETECTED NOT DETECTED Final   Pseudomonas aeruginosa NOT DETECTED NOT DETECTED Final   Candida albicans NOT DETECTED NOT DETECTED Final   Candida glabrata NOT DETECTED NOT DETECTED Final   Candida krusei NOT DETECTED NOT DETECTED Final   Candida parapsilosis NOT DETECTED NOT DETECTED Final   Candida tropicalis NOT DETECTED NOT DETECTED Final    Comment: Performed at [facility], [place] [address], [place], [place] [address]  Body fluid culture (includes gram stain)     Status: None (Preliminary result)   Collection Time: 12/27/19  1:54 PM   Specimen: Pleural Fluid  Result Value Ref Range Status   Specimen Description   Final    PLEURAL Performed at [facility] [address], [place], [place] [address]    Special Requests   Final    Immunocompromised Performed at [facility], [place] [address] [place], [place] [address]    Gram Stain   Final    RARE WBC PRESENT, PREDOMINANTLY MONONUCLEAR NO ORGANISMS SEEN    Culture   Final    NO GROWTH < 24 HOURS Performed at [facility], [place] [address] [place], [place] [address]    Report Status PENDING  Incomplete    Radiology Reports DG Chest 1 View  Result Date: 12/27/2019 CLINICAL DATA:  Status post right thoracentesis EXAM: CHEST  1 VIEW COMPARISON:  12/25/2018 FINDINGS: Cardiac shadow is stable. Right chest  wall port is again seen. Interval right thoracentesis is noted with resolution of the right-sided effusion. Mild right basilar atelectasis remains. No pneumothorax is seen. Stable left pleural effusion is noted. IMPRESSION: No pneumothorax following right thoracentesis. Electronically Signed   By: Inez Catalina M.D.   On: 12/27/2019 14:13   NM PET Image Initial (PI) Whole Body  Result Date: 12/04/2019 CLINICAL DATA:  Initial treatment strategy for cancer of unknown primary. EXAM: NUCLEAR MEDICINE PET WHOLE BODY TECHNIQUE: 8.89 mCi F-18 FDG was injected intravenously. Full-ring PET imaging was performed from the skull base to thigh after the radiotracer. CT data was obtained and used for attenuation correction and anatomic localization. Fasting blood glucose: 115 mg/dl COMPARISON:  CT chest, abdomen and pelvis from 11/03/2019. FINDINGS: Mediastinal blood pool activity: SUV max 2.37 HEAD/NECK: FDG avid lytic lesion within the right temporal bone measures 1.3 cm and has an SUV max of 9.29. No hypermetabolic lymph nodes within the soft tissues of the neck. Incidental CT findings: none CHEST: Right supraclavicular node measures 9 mm short axis and has an SUV max 4.4. No hypermetabolic axillary, mediastinal, or hilar lymph nodes. Moderate bilateral pleural effusions are identified with overlying passive atelectasis. No hypermetabolic mass within the lungs. Mild asymmetric increased uptake along the pleura within the posterior costophrenic sulcus is identified with SUV max of 5.28. Incidental CT findings: none ABDOMEN/PELVIS: FDG avid mass within the tail of pancreas measures 6.7 x 5.0 cm and has an SUV max of 9.38. The tumor extends up to and may involve the splenic hilum. Extensive liver metastasis.  Lesions are too numerous to count. Index lesion within left lobe of liver measures 7.4 by 6.2 cm and  has an SUV max of 10.2. Posteromedial right hepatic lobe lesion measures 4.2 cm, image 135/4 and has an SUV max of 7.1.  The adrenal glands are unremarkable.  No suspicious kidney mass. FDG avid lymph nodes are identified within the upper abdomen. -index peripancreatic node measures 1.4 cm short axis and has an SUV max of 5.9. -index portacaval node measures 1.2 cm within SUV max of 5.16. Small to moderate volume of abdominopelvic ascites identified. Incidental CT findings: Gallstone identified. SKELETON: Multifocal FDG avid bone lesions are noted involving the axial and proximal appendicular skeleton. -expansile, lytic bone lesion with pathologic fracture involving the posterior aspect of the fifth rib measures 2.5 cm and has an SUV max of 8.26. -lucent lesion involving the inferior tip of the body of right scapula measures 1.4 cm with SUV max of 9.1. -lucent lesion within the posterior column of the left acetabulum is also noted within SUV max of 10.24. -lytic lesion involving the L1 vertebra is identified with SUV max of 8.0. Described on MRI from 11/05/2019 is tumor involving T12 and L1 again noted. There has been progression of compression deformity at T12 with FDG avid soft tissue within the canal. This may be of neurologic significance. SUV max within the L1 vertebra is equal to 10.5. Incidental CT findings: none EXTREMITIES: Of potential orthopedic significance is a lytic lesion involving the femoral neck of the proximal left femur measuring 2.2 cm within SUV max of 5.98. also concerning is a 4.3 cm lytic lesion is identified involving the posterior cortex of the proximal diaphysis of the left femur within SUV max of 9.12 Incidental CT findings: none IMPRESSION: 1. Within the distal tail of pancreas there is a large FDG avid mass worrisome for primary pancreas adenocarcinoma. 2. Extensive FDG avid liver metastasis and upper abdominal nodal metastasis. 3. Moderate bilateral pleural effusions identified. Increased pleural uptake within the left posterior costophrenic sulcus is noted which may reflect underlying malignant  effusion. 4. Multifocal FDG avid lytic bone metastases. Progressive compression deformity involving the T12 vertebra is noted and there is increased, FDG avid soft tissue within the canal at this level. This may be of neurologic significance. FDG avid T1 metastasis also noted corresponding to lucent lesion on the CT images. 5. Left proximal femur FDG avid lytic lesions are noted. Although no pathologic fractures present at this time these may be of orthopedic significance in the future. 6. Hypermetabolic lytic lesion is noted involving the right temporal bone which erodes into the mastoid air cells. Electronically Signed   By: Kerby Moors M.D.   On: 12/04/2019 12:03   DG CHEST PORT 1 VIEW  Result Date: 12/28/2019 CLINICAL DATA:  Status post left thoracentesis. EXAM: PORTABLE CHEST 1 VIEW COMPARISON:  12/27/2019 FINDINGS: Cardiac shadow is stable. Right chest wall port is again seen in satisfactory position. Slight reaccumulation of right-sided pleural effusion is noted. There has been interval left thoracentesis with reduction of the left pleural effusion. Incomplete re-expansion of the left lower lobe is noted. No other focal abnormality is seen. IMPRESSION: Incomplete re-expansion of the left lower lobe following left thoracentesis. This is not felt to represent a true pneumothorax. Electronically Signed   By: Inez Catalina M.D.   On: 12/28/2019 11:12   DG CHEST PORT 1 VIEW  Result Date: 12/26/2019 CLINICAL DATA:  54 year old with current history of hypertension, metastatic pancreatic cancer, presenting with acute onset of shortness of breath. EXAM: PORTABLE CHEST 1 VIEW COMPARISON:  PET-CT 12/04/2019. CT chest 11/03/2019. Chest  x-ray 11/03/2019. FINDINGS: Since the prior examinations, interval development of a large RIGHT pleural effusion and a moderate to large LEFT pleural effusion with associated consolidation in the lower lobes. Lungs otherwise clear. Cardiac silhouette normal in size, unchanged.  Pulmonary vascularity normal. RIGHT jugular Port-A-Cath tip projects over the LOWER SVC. IMPRESSION: Large RIGHT pleural effusion and moderate to large LEFT pleural effusion with associated passive atelectasis and/or pneumonia involving the lower lobes. Electronically Signed   By: Evangeline Dakin M.D.   On: 12/26/2019 09:28   DG Abd Portable 1V  Result Date: 12/25/2019 CLINICAL DATA:  Ileus, nausea, vomiting, hypertension, metastatic cancer EXAM: PORTABLE ABDOMEN - 1 VIEW COMPARISON:  Portable exam 0729 hours compared to 12/23/2019 FINDINGS: Scattered gas in colon. Few air-filled distended small bowel loops in mid abdomen. No bowel wall thickening or definite obstruction. Small amount of gas in rectum. Osseous structures unremarkable. IMPRESSION: Few air-filled distended small bowel loops in the mid abdomen question ileus, little changed. Electronically Signed   By: Lavonia Dana M.D.   On: 12/25/2019 09:34   DG Abd Portable 1V  Result Date: 12/23/2019 CLINICAL DATA:  Abdominal distension. EXAM: PORTABLE ABDOMEN - 1 VIEW COMPARISON:  December 21, 2019. FINDINGS: No colonic dilatation is noted. Stable mild small bowel dilatation is noted most consistent with ileus. No radio-opaque calculi or other significant radiographic abnormality are seen. IMPRESSION: Stable mild small bowel dilatation is noted most consistent with ileus. Electronically Signed   By: Marijo Conception M.D.   On: 12/23/2019 09:38   DG Abd Portable 1V  Result Date: 12/21/2019 CLINICAL DATA:  Abdominal distension EXAM: PORTABLE ABDOMEN - 1 VIEW COMPARISON:  11/03/2019 FINDINGS: There are gas distended loops of large and small bowel throughout the abdomen. Stomach is also distended. Small to moderate volume of stool within the right hemicolon. Compression fracture of the T12 vertebral body without definite progression from prior. Pathologic nondisplaced fractures of the posterior and lateral aspects of the right eighth rib. IMPRESSION: 1. Gas  distended loops of large and small bowel throughout the abdomen. Findings most suggestive of ileus, although developing small bowel obstruction not excluded. 2. Pathologic nondisplaced fractures of the posterior and lateral aspects of the right eighth rib. The posterior fracture component appears acute or subacute. 3. Pathologic compression fracture of the T12 vertebral body, not significantly progressed compared to prior CT. Electronically Signed   By: Davina Poke D.O.   On: 12/21/2019 14:52    Lab Data:  CBC: Recent Labs  Lab 12/23/19 0500 12/23/19 0500 12/24/19 0710 12/25/19 1040 12/26/19 0311 12/27/19 0544 12/28/19 0358  WBC 0.5*   < > 0.1* 0.1* 0.3* 1.6* 1.8*  NEUTROABS 0.3*  --   --   --   --   --   --   HGB 10.8*   < > 10.8* 10.2* 9.2* 10.0* 8.6*  HCT 36.9*   < > 36.2* 33.2* 30.7* 33.4* 29.4*  MCV 75.3*   < > 74.3* 74.8* 75.6* 75.9* 75.2*  PLT 69*   < > 50* 25* 15* 14* 19*   < > = values in this interval not displayed.   Basic Metabolic Panel: Recent Labs  Lab 12/22/19 1144 12/22/19 1144 12/24/19 0710 12/25/19 1040 12/26/19 0311 12/27/19 0544 12/27/19 1038 12/28/19 0358  NA 135  --  133* 134*  --  140  --  142  K 4.4  --  4.2 4.2  --  3.8  --  3.7  CL 105  --  107 108  --  110  --  113*  CO2 22  --  19* 19*  --  15*  --  17*  GLUCOSE 232*  --  107* 110*  --  163*  --  228*  BUN 31*  --  22* 20  --  31*  --  38*  CREATININE 0.57*   < > 0.53* 0.51* 0.54* 0.77  --  0.74  CALCIUM 7.8*  --  7.5* 7.2*  --  7.4*  --  7.3*  MG 2.1  --  1.9  --   --   --  2.1 2.1  PHOS  --   --   --  2.5  --   --   --  3.1   < > = values in this interval not displayed.   GFR: Estimated Creatinine Clearance: 105.6 mL/min (by C-G formula based on SCr of 0.74 mg/dL). Liver Function Tests: Recent Labs  Lab 12/25/19 1040 12/27/19 0544 12/28/19 0358  AST  --  62* 53*  ALT  --  96* 79*  ALKPHOS  --  819* 701*  BILITOT  --  7.3* 6.4*  PROT  --  4.5* 4.2*  ALBUMIN 1.5* 2.0* 2.0*     Recent Labs  Lab 12/27/19 1038  LIPASE 21  AMYLASE 22*   No results for input(s): AMMONIA in the last 168 hours. Coagulation Profile: No results for input(s): INR, PROTIME in the last 168 hours. Cardiac Enzymes: No results for input(s): CKTOTAL, CKMB, CKMBINDEX, TROPONINI in the last 168 hours. BNP (last 3 results) No results for input(s): PROBNP in the last 8760 hours. HbA1C: No results for input(s): HGBA1C in the last 72 hours. CBG: Recent Labs  Lab 12/27/19 2000 12/27/19 2329 12/28/19 0346 12/28/19 0750 12/28/19 1226  GLUCAP 180* 227* 199* 244* 251*   Lipid Profile: No results for input(s): CHOL, HDL, LDLCALC, TRIG, CHOLHDL, LDLDIRECT in the last 72 hours. Thyroid Function Tests: No results for input(s): TSH, T4TOTAL, FREET4, T3FREE, THYROIDAB in the last 72 hours. Anemia Panel: No results for input(s): VITAMINB12, FOLATE, FERRITIN, TIBC, IRON, RETICCTPCT in the last 72 hours. Urine analysis:    Component Value Date/Time   COLORURINE STRAW (A) 11/04/2019 0744   APPEARANCEUR CLEAR 11/04/2019 0744   LABSPEC 1.009 11/04/2019 [place] 7.0 11/04/2019 [place] 11/04/2019 [place] 11/04/2019 [place] 11/04/2019 [place] 11/04/2019 0744   PROTEINUR NEGATIVE 11/04/2019 0744   NITRITE NEGATIVE 11/04/2019 0744   LEUKOCYTESUR NEGATIVE 11/04/2019 0744     Joceline Hinchcliff M.D. [facility] 12/28/2019, 1:08 PM   Call night coverage person covering after 7pm

## 2019-12-28 NOTE — Progress Notes (Signed)
CRITICAL VALUE ALERT  Critical Value: platelets 19  Date & Time Notied:  12/28/19  Provider Notified: [email or web address]  Orders Received/Actions taken: continue to monitor

## 2019-12-29 ENCOUNTER — Inpatient Hospital Stay (HOSPITAL_COMMUNITY): Payer: Managed Care, Other (non HMO)

## 2019-12-29 ENCOUNTER — Other Ambulatory Visit: Payer: Self-pay

## 2019-12-29 DIAGNOSIS — R233 Spontaneous ecchymoses: Secondary | ICD-10-CM

## 2019-12-29 DIAGNOSIS — Z95828 Presence of other vascular implants and grafts: Secondary | ICD-10-CM

## 2019-12-29 DIAGNOSIS — R7881 Bacteremia: Secondary | ICD-10-CM

## 2019-12-29 DIAGNOSIS — Z960 Presence of urogenital implants: Secondary | ICD-10-CM

## 2019-12-29 DIAGNOSIS — B954 Other streptococcus as the cause of diseases classified elsewhere: Secondary | ICD-10-CM

## 2019-12-29 DIAGNOSIS — D701 Agranulocytosis secondary to cancer chemotherapy: Secondary | ICD-10-CM

## 2019-12-29 DIAGNOSIS — M8448XD Pathological fracture, other site, subsequent encounter for fracture with routine healing: Secondary | ICD-10-CM

## 2019-12-29 DIAGNOSIS — B9689 Other specified bacterial agents as the cause of diseases classified elsewhere: Secondary | ICD-10-CM

## 2019-12-29 DIAGNOSIS — B955 Unspecified streptococcus as the cause of diseases classified elsewhere: Secondary | ICD-10-CM | POA: Diagnosis not present

## 2019-12-29 LAB — GLUCOSE, CAPILLARY
Glucose-Capillary: 221 mg/dL — ABNORMAL HIGH (ref 70–99)
Glucose-Capillary: 247 mg/dL — ABNORMAL HIGH (ref 70–99)
Glucose-Capillary: 198 mg/dL — ABNORMAL HIGH (ref 70–99)
Glucose-Capillary: 214 mg/dL — ABNORMAL HIGH (ref 70–99)
Glucose-Capillary: 214 mg/dL — ABNORMAL HIGH (ref 70–99)
Glucose-Capillary: 200 mg/dL — ABNORMAL HIGH (ref 70–99)
Glucose-Capillary: 228 mg/dL — ABNORMAL HIGH (ref 70–99)

## 2019-12-29 LAB — DIFFERENTIAL
Abs Immature Granulocytes: 0.01 10*3/uL (ref 0.00–0.07)
Basophils Absolute: 0 10*3/uL (ref 0.0–0.1)
Basophils Relative: 0 %
Eosinophils Absolute: 0 10*3/uL (ref 0.0–0.5)
Eosinophils Relative: 0 %
Immature Granulocytes: 1 %
Lymphocytes Relative: 3 %
Lymphs Abs: 0.1 10*3/uL — ABNORMAL LOW (ref 0.7–4.0)
Monocytes Absolute: 0.1 10*3/uL (ref 0.1–1.0)
Monocytes Relative: 6 %
Neutro Abs: 1.7 10*3/uL (ref 1.7–7.7)
Neutrophils Relative %: 90 %

## 2019-12-29 LAB — CBC
HCT: 28.1 % — ABNORMAL LOW (ref 39.0–52.0)
HCT: 29.1 % — ABNORMAL LOW (ref 39.0–52.0)
Hemoglobin: 8.6 g/dL — ABNORMAL LOW (ref 13.0–17.0)
Hemoglobin: 9 g/dL — ABNORMAL LOW (ref 13.0–17.0)
MCH: 22.7 pg — ABNORMAL LOW (ref 26.0–34.0)
MCH: 22.8 pg — ABNORMAL LOW (ref 26.0–34.0)
MCHC: 30.6 g/dL (ref 30.0–36.0)
MCHC: 30.9 g/dL (ref 30.0–36.0)
MCV: 74.1 fL — ABNORMAL LOW (ref 80.0–100.0)
MCV: 73.7 fL — ABNORMAL LOW (ref 80.0–100.0)
Platelets: 10 K/uL — CL (ref 150–400)
Platelets: 18 10*3/uL — CL (ref 150–400)
RBC: 3.79 MIL/uL — ABNORMAL LOW (ref 4.22–5.81)
RBC: 3.95 MIL/uL — ABNORMAL LOW (ref 4.22–5.81)
RDW: 23.8 % — ABNORMAL HIGH (ref 11.5–15.5)
RDW: 23.5 % — ABNORMAL HIGH (ref 11.5–15.5)
WBC: 1.7 K/uL — ABNORMAL LOW (ref 4.0–10.5)
WBC: 2 10*3/uL — ABNORMAL LOW (ref 4.0–10.5)
nRBC: 1.2 % — ABNORMAL HIGH (ref 0.0–0.2)
nRBC: 0 % (ref 0.0–0.2)

## 2019-12-29 LAB — CULTURE, BLOOD (ROUTINE X 2): Special Requests: ADEQUATE

## 2019-12-29 LAB — COMPREHENSIVE METABOLIC PANEL WITH GFR
ALT: 83 U/L — ABNORMAL HIGH (ref 0–44)
AST: 70 U/L — ABNORMAL HIGH (ref 15–41)
Albumin: 2.4 g/dL — ABNORMAL LOW (ref 3.5–5.0)
Alkaline Phosphatase: 803 U/L — ABNORMAL HIGH (ref 38–126)
Anion gap: 10 (ref 5–15)
BUN: 37 mg/dL — ABNORMAL HIGH (ref 6–20)
CO2: 18 mmol/L — ABNORMAL LOW (ref 22–32)
Calcium: 7.3 mg/dL — ABNORMAL LOW (ref 8.9–10.3)
Chloride: 115 mmol/L — ABNORMAL HIGH (ref 98–111)
Creatinine, Ser: 0.69 mg/dL (ref 0.61–1.24)
GFR calc Af Amer: >60 mL/min
GFR calc non Af Amer: >60 mL/min
Glucose, Bld: 250 mg/dL — ABNORMAL HIGH (ref 70–99)
Potassium: 3.6 mmol/L (ref 3.5–5.1)
Sodium: 143 mmol/L (ref 135–145)
Total Bilirubin: 6.8 mg/dL — ABNORMAL HIGH (ref 0.3–1.2)
Total Protein: 4.5 g/dL — ABNORMAL LOW (ref 6.5–8.1)

## 2019-12-29 MED ORDER — DIPHENHYDRAMINE HCL 50 MG/ML IJ SOLN: 12.5000 mg | Freq: Four times a day (QID) | INTRAMUSCULAR | Status: DC | PRN

## 2019-12-29 MED ORDER — SODIUM CHLORIDE 0.9% IV SOLUTION: Freq: Once | INTRAVENOUS | Status: DC

## 2019-12-29 NOTE — Progress Notes (Addendum)
[facility]                                                                              Patient Demographics  Kenneth Khan, is a 54 y.o. male, DOB - 19-Mar-1966, LX:[id number]  Admit date - 12/20/2019   Admitting Physician Mariel Aloe, MD  Outpatient Primary MD for the patient is Nicholes Rough, PA-C  Outpatient specialists:   LOS - 10  days   Medical records reviewed and are as summarized below:    No chief complaint on file.      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  Patient presented from his oncologist office secondary to severe functional debility. 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  Liver biopsy showed metastatic squamous cell carcinoma.  PET/CT 11/2019 showed mass in pancreas and liver -currently on chemotherapy and radiation therapy -Chemotherapy with carboplatin/paclitaxel started on 2/1. -Unfortunately has severe leukopenia, thrombocytopenia, likely chemotherapy induced cytopenias.  Per oncology, transfuse for platelets less than 10 - PLT's 10K today, will transfuse 1 unit platelet pheresis   Severe generalized debility -In the setting of metastatic cancer, spinal lesions, poor functional status.  -PT evaluation recommended CIR, when medically ready.   Hypotension, tachycardia, SIRS -Transfer to SDU on 2/9 for hypotension, sepsis -Patient was placed on albumin 5% for resuscitation, empiric broad-spectrum antibiotics with vancomycin and cefepime, stress dose steroids -BP currently stable,  continue stress dose steroids, cefepime Addendum: 4:00pm Called by pharmacy, blood cultures growing strept viridans  - currently on cefepime which covers.Ordered 2d-Echo, repeat blood cx's - Will consult ID for further recommendations, d/w Dr Megan Salon, ?TEE  Abdominal distention/ileus - Abdominal x-ray on 2/9 showed few air-filled distended small bowel loops in the mid abdomen, question ileus.   -Constipation improved, per patient tolerating diet  Mild respiratory distress, large b/l pleural effusions, anasarca -Respiratory status much better after thoracentesis -Chest x-ray 2/10 shows large right pleural effusion and moderate to large left pleural effusion with associated passive atelectasis and/or pneumonia involving the lower lobes -Status post right thoracentesis on 2/11, 1.8 L out, status post left thoracentesis on 2/12 800 cc removed  Cytopenias with leukopenia, thrombocytopenia -Unfortunately has developed severe cytopenia secondary to chemotherapy. - received platelet pheresis on 2/11, ordered platelet pheresis again today  Transaminitis, elevated alkaline phosphatase -Likely secondary to metastatic disease  Iron deficiency anemia Likely secondary to blood loss from active malignancy, continue iron supplementation  History of essential hypertension -Off antihypertensives  Pressure injury: POA Buttocks right, left, mid stage II Bilateral heels, stage I  -Wound care per nursing   Code Status: Full code DVT Prophylaxis: SCD Family Communication: Discussed all imaging results, lab results, explained to the patient's fianc, [name] on the phone   Disposition Plan: Patient from home, unable  to return home with severe functional debility.  If respiratory status improves and able to endure PT, will need CIR  Time Spent in minutes   25 minutes  Procedures:  Right-sided thoracentesis 2/11 Left-sided thoracentesis 2/12  Consultants:   Oncology CCM CIR Palliative  medicine  Antimicrobials:   Anti-infectives (From admission, onward)   Start     Dose/Rate Route Frequency Ordered Stop   12/26/19 0800  vancomycin (VANCOREADY) IVPB 1500 mg/300 mL  Status:  Discontinued     1,500 mg 150 mL/hr over 120 Minutes Intravenous Every 12 hours 12/25/19 1902 12/26/19 1105   12/25/19 1900  ceFEPIme (MAXIPIME) 2 g in sodium chloride 0.9 % 100 mL IVPB     2 g 200 mL/hr over 30 Minutes Intravenous Every 8 hours 12/25/19 1844     12/25/19 1900  vancomycin (VANCOREADY) IVPB 1750 mg/350 mL     1,750 mg 175 mL/hr over 120 Minutes Intravenous  Once 12/25/19 1845 12/25/19 2347         Medications  Scheduled Meds: . sodium chloride   Intravenous Once  . chlorhexidine  15 mL Mouth Rinse BID  . Chlorhexidine Gluconate Cloth  6 each Topical Daily  . fenofibrate  54 mg Oral Daily  . ferrous sulfate  325 mg Oral Q breakfast  . Gerhardt's butt cream   Topical BID  . insulin aspart  0-6 Units Subcutaneous Q4H  . mouth rinse  15 mL Mouth Rinse q12n4p  . methocarbamol  750 mg Oral TID  . methylPREDNISolone (SOLU-MEDROL) injection  60 mg Intravenous Q12H  . pantoprazole  40 mg Oral BID  . pravastatin  40 mg Oral Daily  . sodium chloride flush  10-40 mL Intracatheter Q12H   Continuous Infusions: . ceFEPime (MAXIPIME) IV Stopped (12/29/19 [id number])  . ondansetron [facility]) IV Stopped (12/27/19 0916)   PRN Meds:.acetaminophen **OR** acetaminophen, alum & mag hydroxide-simeth **AND** [COMPLETED] lidocaine, calcium carbonate, diphenhydrAMINE, lip balm, LORazepam, magic mouthwash w/lidocaine, Melatonin, ondansetron (ZOFRAN) IV, oxyCODONE, prochlorperazine, sodium chloride flush      Subjective:   Kenneth Khan was seen and examined today.  No acute issues overnight.  Overall feeling better.  Pain is controlled.  No nausea vomiting  Objective:   Vitals:   12/29/19 0850 12/29/19 0900 12/29/19 1000 12/29/19 1100  BP:  114/61 116/67 (!) 110/59  Pulse: 88 94 99 96  Resp:  (!) 22 (!) 21 (!) 21 18  Temp: (!) 97.3 F (36.3 C) (!) 97.2 F (36.2 C) (!) 97.2 F (36.2 C) (!) 97.2 F (36.2 C)  TempSrc:    Bladder  SpO2: 99% 97% 98% 99%  Weight:      Height:        Intake/Output Summary (Last 24 hours) at 12/29/2019 1124 Last data filed at 12/29/2019 1059 Gross per 24 hour  Intake 1257.07 ml  Output 1010 ml  Net 247.07 ml     Wt Readings from Last 3 Encounters:  12/25/19 81.5 kg  11/19/19 81.5 kg  11/14/19 82.7 kg    Physical Exam  General: Alert and oriented x 3, NAD  Cardiovascular: S1 S2 clear, RRR.  1+ pedal edema b/l  Respiratory: Decreased breath sound at the bases  Gastrointestinal: Soft, nontender, nondistended, NBS  Ext: 1+ pedal edema bilaterally  Neuro: no new deficits  Musculoskeletal: No cyanosis, clubbing  Skin: No rashes  Psych: Normal affect and demeanor, alert and oriented x3     Data Reviewed:  I have personally reviewed following labs and imaging studies  Micro Results Recent Results (from the past 240 hour(s))  Resp Panel by RT PCR (RSV, Flu A&B, Covid) -     Status: None   Collection Time: 01/12/2020  3:20 PM  Result Value Ref Range Status   SARS Coronavirus 2 by RT PCR NEGATIVE NEGATIVE Final    Comment: (NOTE) SARS-CoV-2 target nucleic acids are NOT DETECTED. The SARS-CoV-2 RNA is generally detectable in upper respiratoy specimens during the acute phase of infection. The lowest concentration of SARS-CoV-2 viral copies this assay can detect is 131 copies/mL. A negative result does not preclude SARS-Cov-2 infection and should not be used as the sole basis for treatment or other patient management decisions. A negative result may occur with  improper specimen collection/handling, submission of specimen other than nasopharyngeal swab, presence of viral mutation(s) within the areas targeted by this assay, and inadequate number of viral copies (<131 copies/mL). A negative result must be combined with  clinical observations, patient history, and epidemiological information. The expected result is Negative. Fact Sheet for Patients:  PinkCheek.be Fact Sheet for Healthcare Providers:  GravelBags.it This test is not yet ap proved or cleared by the [place] FDA and  has been authorized for detection and/or diagnosis of SARS-CoV-2 by FDA under an Emergency Use Authorization (EUA). This EUA will remain  in effect (meaning this test can be used) for the duration of the COVID-19 declaration under Section 564(b)(1) of the Act, 21 U.S.C. section 360bbb-3(b)(1), unless the authorization is terminated or revoked sooner.    Influenza A by PCR NEGATIVE NEGATIVE Final   Influenza B by PCR NEGATIVE NEGATIVE Final    Comment: (NOTE) The Xpert Xpress SARS-CoV-2/FLU/RSV assay is intended as an aid in  the diagnosis of influenza from Nasopharyngeal swab specimens and  should not be used as a sole basis for treatment. Nasal washings and  aspirates are unacceptable for Xpert Xpress SARS-CoV-2/FLU/RSV  testing. Fact Sheet for Patients: PinkCheek.be Fact Sheet for Healthcare Providers: GravelBags.it This test is not yet approved or cleared by the [place] FDA and  has been authorized for detection and/or diagnosis of SARS-CoV-2 by  FDA under an Emergency Use Authorization (EUA). This EUA will remain  in effect (meaning this test can be used) for the duration of the  Covid-19 declaration under Section 564(b)(1) of the Act, 21  U.S.C. section 360bbb-3(b)(1), unless the authorization is  terminated or revoked.    Respiratory Syncytial Virus by PCR NEGATIVE NEGATIVE Final    Comment: (NOTE) Fact Sheet for Patients: PinkCheek.be Fact Sheet for Healthcare Providers: GravelBags.it This test is not yet approved or cleared by the  [place] FDA and  has been authorized for detection and/or diagnosis of SARS-CoV-2 by  FDA under an Emergency Use Authorization (EUA). This EUA will remain  in effect (meaning this test can be used) for the duration of the  COVID-19 declaration under Section 564(b)(1) of the Act, 21 U.S.C.  section 360bbb-3(b)(1), unless the authorization is terminated or  revoked. Performed at [facility], [place] [address], [place], [place] [address]   MRSA PCR Screening     Status: None   Collection Time: 12/25/19  4:37 PM   Specimen: Nasal Mucosa; Nasopharyngeal  Result Value Ref Range Status   MRSA by PCR NEGATIVE NEGATIVE Final    Comment:        The GeneXpert MRSA Assay (FDA approved for NASAL specimens only), is one component of a comprehensive MRSA colonization surveillance program. It is not intended to diagnose  MRSA infection nor to guide or monitor treatment for MRSA infections. Performed at [facility], [place] [address] [place], [place] [address]   Culture, blood (Routine X 2) w Reflex to ID Panel     Status: Abnormal (Preliminary result)   Collection Time: 12/25/19  7:25 PM   Specimen: BLOOD RIGHT ARM  Result Value Ref Range Status   Specimen Description   Final    BLOOD RIGHT ARM Performed at [facility], [place] [address] [place], [place] [address]    Special Requests   Final    BOTTLES DRAWN AEROBIC AND ANAEROBIC Blood Culture adequate volume Performed at [place] [address] [place], [place] [address]    Culture  Setup Time   Final    GRAM NEGATIVE RODS IN BOTH AEROBIC AND ANAEROBIC BOTTLES CRITICAL RESULT CALLED TO, READ BACK BY AND VERIFIED WITH: Seleta Rhymes PharmD 10:55 12/26/19 ([email or web address])    Culture (A)  Final    ENTEROBACTER CLOACAE CULTURE REINCUBATED FOR BETTER GROWTH Performed at [facility], [place] [address] [place], [place] [address]    Report Status PENDING  Incomplete   Organism  ID, Bacteria ENTEROBACTER CLOACAE  Final      Susceptibility   Enterobacter cloacae - MIC*    CEFAZOLIN >=64 RESISTANT Resistant     CEFEPIME <=0.12 SENSITIVE Sensitive     CEFTAZIDIME <=1 SENSITIVE Sensitive     CIPROFLOXACIN <=0.25 SENSITIVE Sensitive     GENTAMICIN <=1 SENSITIVE Sensitive     IMIPENEM 0.5 SENSITIVE Sensitive     TRIMETH/SULFA <=20 SENSITIVE Sensitive     PIP/TAZO <=4 SENSITIVE Sensitive     * ENTEROBACTER CLOACAE  Culture, blood (Routine X 2) w Reflex to ID Panel     Status: Abnormal   Collection Time: 12/25/19  7:25 PM   Specimen: BLOOD RIGHT ARM  Result Value Ref Range Status   Specimen Description   Final    BLOOD RIGHT ARM Performed at [facility], [place] [address] [place], [place] [address]    Special Requests   Final    BOTTLES DRAWN AEROBIC ONLY Blood Culture adequate volume Performed at [place] [address] [place], [place] [address]    Culture  Setup Time   Final    GRAM NEGATIVE RODS AEROBIC BOTTLE ONLY CRITICAL VALUE NOTED.  VALUE IS CONSISTENT WITH PREVIOUSLY REPORTED AND CALLED VALUE.    Culture (A)  Final    ENTEROBACTER CLOACAE SUSCEPTIBILITIES PERFORMED ON PREVIOUS CULTURE WITHIN THE LAST 5 DAYS. Performed at [facility], [place] [address], [place], [place] [address]    Report Status 12/28/2019 FINAL  Final  Blood Culture ID Panel (Reflexed)     Status: Abnormal   Collection Time: 12/25/19  7:25 PM  Result Value Ref Range Status   Enterococcus species NOT DETECTED NOT DETECTED Final   Listeria monocytogenes NOT DETECTED NOT DETECTED Final   Staphylococcus species NOT DETECTED NOT DETECTED Final   Staphylococcus aureus (BCID) NOT DETECTED NOT DETECTED Final   Streptococcus species NOT DETECTED NOT DETECTED Final   Streptococcus agalactiae NOT DETECTED NOT DETECTED Final   Streptococcus pneumoniae NOT DETECTED NOT DETECTED Final   Streptococcus pyogenes NOT DETECTED NOT DETECTED Final   Acinetobacter  baumannii NOT DETECTED NOT DETECTED Final   Enterobacteriaceae species DETECTED (A) NOT DETECTED Final    Comment: Enterobacteriaceae represent a large family of gram-negative bacteria, not a single organism. CRITICAL RESULT CALLED TO,  READ BACK BY AND VERIFIED WITH: Seleta Rhymes PharmD 10:55 12/26/19 ([email or web address])    Enterobacter cloacae complex DETECTED (A) NOT DETECTED Final    Comment: CRITICAL RESULT CALLED TO, READ BACK BY AND VERIFIED WITH: Seleta Rhymes PharmD 10:55 12/26/19 ([email or web address])    Escherichia coli NOT DETECTED NOT DETECTED Final   Klebsiella oxytoca NOT DETECTED NOT DETECTED Final   Klebsiella pneumoniae NOT DETECTED NOT DETECTED Final   Proteus species NOT DETECTED NOT DETECTED Final   Serratia marcescens NOT DETECTED NOT DETECTED Final   Carbapenem resistance NOT DETECTED NOT DETECTED Final   Haemophilus influenzae NOT DETECTED NOT DETECTED Final   Neisseria meningitidis NOT DETECTED NOT DETECTED Final   Pseudomonas aeruginosa NOT DETECTED NOT DETECTED Final   Candida albicans NOT DETECTED NOT DETECTED Final   Candida glabrata NOT DETECTED NOT DETECTED Final   Candida krusei NOT DETECTED NOT DETECTED Final   Candida parapsilosis NOT DETECTED NOT DETECTED Final   Candida tropicalis NOT DETECTED NOT DETECTED Final    Comment: Performed at [facility], [place] [address] [place], [place] [address]  Body fluid culture (includes gram stain)     Status: None (Preliminary result)   Collection Time: 12/27/19  1:54 PM   Specimen: Pleural Fluid  Result Value Ref Range Status   Specimen Description   Final    PLEURAL Performed at [place] [address] [place], [place] [address]    Special Requests   Final    Immunocompromised Performed at [facility], [place] [address], [place], [place] [address]    Gram Stain   Final    RARE WBC PRESENT, PREDOMINANTLY MONONUCLEAR NO ORGANISMS SEEN    Culture   Final    NO GROWTH < 24  HOURS Performed at [facility], [place] [address] [place], [place] [address]    Report Status PENDING  Incomplete  Body fluid culture (includes gram stain)     Status: None (Preliminary result)   Collection Time: 12/28/19 10:48 AM   Specimen: Pleural Fluid  Result Value Ref Range Status   Specimen Description   Final    PLEURAL Performed at [place] [address], [place], [place] [address]    Special Requests   Final    Immunocompromised Performed at [facility], [place] [address] [place], [place] [address]    Gram Stain   Final    FEW WBC PRESENT, PREDOMINANTLY MONONUCLEAR NO ORGANISMS SEEN Performed at [facility], [place] [address] [place], [place] [address]    Culture PENDING  Incomplete   Report Status PENDING  Incomplete    Radiology Reports DG Chest 1 View  Result Date: 12/27/2019 CLINICAL DATA:  Status post right thoracentesis EXAM: CHEST  1 VIEW COMPARISON:  12/25/2018 FINDINGS: Cardiac shadow is stable. Right chest wall port is again seen. Interval right thoracentesis is noted with resolution of the right-sided effusion. Mild right basilar atelectasis remains. No pneumothorax is seen. Stable left pleural effusion is noted. IMPRESSION: No pneumothorax following right thoracentesis. Electronically Signed   By: Inez Catalina M.D.   On: 12/27/2019 14:13   NM PET Image Initial (PI) Whole Body  Result Date: 12/04/2019 CLINICAL DATA:  Initial treatment strategy for cancer of unknown primary. EXAM: NUCLEAR MEDICINE PET WHOLE BODY TECHNIQUE: 8.89 mCi F-18 FDG was injected intravenously. Full-ring PET imaging was performed from the skull base to thigh after the radiotracer. CT data was obtained and used for attenuation  correction and anatomic localization. Fasting blood glucose: 115 mg/dl COMPARISON:  CT chest, abdomen and pelvis from 11/03/2019. FINDINGS: Mediastinal blood pool activity: SUV max 2.37 HEAD/NECK: FDG avid lytic  lesion within the right temporal bone measures 1.3 cm and has an SUV max of 9.29. No hypermetabolic lymph nodes within the soft tissues of the neck. Incidental CT findings: none CHEST: Right supraclavicular node measures 9 mm short axis and has an SUV max 4.4. No hypermetabolic axillary, mediastinal, or hilar lymph nodes. Moderate bilateral pleural effusions are identified with overlying passive atelectasis. No hypermetabolic mass within the lungs. Mild asymmetric increased uptake along the pleura within the posterior costophrenic sulcus is identified with SUV max of 5.28. Incidental CT findings: none ABDOMEN/PELVIS: FDG avid mass within the tail of pancreas measures 6.7 x 5.0 cm and has an SUV max of 9.38. The tumor extends up to and may involve the splenic hilum. Extensive liver metastasis.  Lesions are too numerous to count. Index lesion within left lobe of liver measures 7.4 by 6.2 cm and has an SUV max of 10.2. Posteromedial right hepatic lobe lesion measures 4.2 cm, image 135/4 and has an SUV max of 7.1. The adrenal glands are unremarkable.  No suspicious kidney mass. FDG avid lymph nodes are identified within the upper abdomen. -index peripancreatic node measures 1.4 cm short axis and has an SUV max of 5.9. -index portacaval node measures 1.2 cm within SUV max of 5.16. Small to moderate volume of abdominopelvic ascites identified. Incidental CT findings: Gallstone identified. SKELETON: Multifocal FDG avid bone lesions are noted involving the axial and proximal appendicular skeleton. -expansile, lytic bone lesion with pathologic fracture involving the posterior aspect of the fifth rib measures 2.5 cm and has an SUV max of 8.26. -lucent lesion involving the inferior tip of the body of right scapula measures 1.4 cm with SUV max of 9.1. -lucent lesion within the posterior column of the left acetabulum is also noted within SUV max of 10.24. -lytic lesion involving the L1 vertebra is identified with SUV max of 8.0.  Described on MRI from 11/05/2019 is tumor involving T12 and L1 again noted. There has been progression of compression deformity at T12 with FDG avid soft tissue within the canal. This may be of neurologic significance. SUV max within the L1 vertebra is equal to 10.5. Incidental CT findings: none EXTREMITIES: Of potential orthopedic significance is a lytic lesion involving the femoral neck of the proximal left femur measuring 2.2 cm within SUV max of 5.98. also concerning is a 4.3 cm lytic lesion is identified involving the posterior cortex of the proximal diaphysis of the left femur within SUV max of 9.12 Incidental CT findings: none IMPRESSION: 1. Within the distal tail of pancreas there is a large FDG avid mass worrisome for primary pancreas adenocarcinoma. 2. Extensive FDG avid liver metastasis and upper abdominal nodal metastasis. 3. Moderate bilateral pleural effusions identified. Increased pleural uptake within the left posterior costophrenic sulcus is noted which may reflect underlying malignant effusion. 4. Multifocal FDG avid lytic bone metastases. Progressive compression deformity involving the T12 vertebra is noted and there is increased, FDG avid soft tissue within the canal at this level. This may be of neurologic significance. FDG avid T1 metastasis also noted corresponding to lucent lesion on the CT images. 5. Left proximal femur FDG avid lytic lesions are noted. Although no pathologic fractures present at this time these may be of orthopedic significance in the future. 6. Hypermetabolic lytic lesion is noted involving the right  temporal bone which erodes into the mastoid air cells. Electronically Signed   By: Kerby Moors M.D.   On: 12/04/2019 12:03   DG Chest Port 1 View  Result Date: 12/29/2019 CLINICAL DATA:  Shortness of breath EXAM: PORTABLE CHEST 1 VIEW COMPARISON:  12/28/2019 FINDINGS: Power port remains in place with its tip at the SVC RA junction. Pleural fluid reaccumulation on the  left. Small amount of residual pleural air. Small to moderate amount of pleural fluid on the right persists. Associated lower lung pulmonary density. IMPRESSION: Reaccumulating pleural fluid. Lower lung atelectasis. Diminishing pleural air on the left. Electronically Signed   By: Nelson Chimes M.D.   On: 12/29/2019 04:34   DG CHEST PORT 1 VIEW  Result Date: 12/28/2019 CLINICAL DATA:  Status post left thoracentesis. EXAM: PORTABLE CHEST 1 VIEW COMPARISON:  12/27/2019 FINDINGS: Cardiac shadow is stable. Right chest wall port is again seen in satisfactory position. Slight reaccumulation of right-sided pleural effusion is noted. There has been interval left thoracentesis with reduction of the left pleural effusion. Incomplete re-expansion of the left lower lobe is noted. No other focal abnormality is seen. IMPRESSION: Incomplete re-expansion of the left lower lobe following left thoracentesis. This is not felt to represent a true pneumothorax. Electronically Signed   By: Inez Catalina M.D.   On: 12/28/2019 11:12   DG CHEST PORT 1 VIEW  Result Date: 12/26/2019 CLINICAL DATA:  54 year old with current history of hypertension, metastatic pancreatic cancer, presenting with acute onset of shortness of breath. EXAM: PORTABLE CHEST 1 VIEW COMPARISON:  PET-CT 12/04/2019. CT chest 11/03/2019. Chest x-ray 11/03/2019. FINDINGS: Since the prior examinations, interval development of a large RIGHT pleural effusion and a moderate to large LEFT pleural effusion with associated consolidation in the lower lobes. Lungs otherwise clear. Cardiac silhouette normal in size, unchanged. Pulmonary vascularity normal. RIGHT jugular Port-A-Cath tip projects over the LOWER SVC. IMPRESSION: Large RIGHT pleural effusion and moderate to large LEFT pleural effusion with associated passive atelectasis and/or pneumonia involving the lower lobes. Electronically Signed   By: Evangeline Dakin M.D.   On: 12/26/2019 09:28   DG Abd Portable  1V  Result Date: 12/25/2019 CLINICAL DATA:  Ileus, nausea, vomiting, hypertension, metastatic cancer EXAM: PORTABLE ABDOMEN - 1 VIEW COMPARISON:  Portable exam 0729 hours compared to 12/23/2019 FINDINGS: Scattered gas in colon. Few air-filled distended small bowel loops in mid abdomen. No bowel wall thickening or definite obstruction. Small amount of gas in rectum. Osseous structures unremarkable. IMPRESSION: Few air-filled distended small bowel loops in the mid abdomen question ileus, little changed. Electronically Signed   By: Lavonia Dana M.D.   On: 12/25/2019 09:34   DG Abd Portable 1V  Result Date: 12/23/2019 CLINICAL DATA:  Abdominal distension. EXAM: PORTABLE ABDOMEN - 1 VIEW COMPARISON:  December 21, 2019. FINDINGS: No colonic dilatation is noted. Stable mild small bowel dilatation is noted most consistent with ileus. No radio-opaque calculi or other significant radiographic abnormality are seen. IMPRESSION: Stable mild small bowel dilatation is noted most consistent with ileus. Electronically Signed   By: Marijo Conception M.D.   On: 12/23/2019 09:38   DG Abd Portable 1V  Result Date: 12/21/2019 CLINICAL DATA:  Abdominal distension EXAM: PORTABLE ABDOMEN - 1 VIEW COMPARISON:  11/03/2019 FINDINGS: There are gas distended loops of large and small bowel throughout the abdomen. Stomach is also distended. Small to moderate volume of stool within the right hemicolon. Compression fracture of the T12 vertebral body without definite progression from prior. Pathologic nondisplaced  fractures of the posterior and lateral aspects of the right eighth rib. IMPRESSION: 1. Gas distended loops of large and small bowel throughout the abdomen. Findings most suggestive of ileus, although developing small bowel obstruction not excluded. 2. Pathologic nondisplaced fractures of the posterior and lateral aspects of the right eighth rib. The posterior fracture component appears acute or subacute. 3. Pathologic compression  fracture of the T12 vertebral body, not significantly progressed compared to prior CT. Electronically Signed   By: Davina Poke D.O.   On: 12/21/2019 14:52    Lab Data:  CBC: Recent Labs  Lab 12/23/19 0500 12/24/19 0710 12/25/19 1040 12/26/19 0311 12/27/19 0544 12/28/19 0358 12/29/19 0500  WBC 0.5*   < > 0.1* 0.3* 1.6* 1.8* 1.7*  NEUTROABS 0.3*  --   --   --   --   --   --   HGB 10.8*   < > 10.2* 9.2* 10.0* 8.6* 8.6*  HCT 36.9*   < > 33.2* 30.7* 33.4* 29.4* 28.1*  MCV 75.3*   < > 74.8* 75.6* 75.9* 75.2* 74.1*  PLT 69*   < > 25* 15* 14* 19* 10*   < > = values in this interval not displayed.   Basic Metabolic Panel: Recent Labs  Lab 12/22/19 1144 12/22/19 1144 12/24/19 0710 12/24/19 0710 12/25/19 1040 12/26/19 0311 12/27/19 0544 12/27/19 1038 12/28/19 0358 12/29/19 0500  NA 135   < > 133*  --  134*  --  140  --  142 143  K 4.4   < > 4.2  --  4.2  --  3.8  --  3.7 3.6  CL 105   < > 107  --  108  --  110  --  113* 115*  CO2 22   < > 19*  --  19*  --  15*  --  17* 18*  GLUCOSE 232*   < > 107*  --  110*  --  163*  --  228* 250*  BUN 31*   < > 22*  --  20  --  31*  --  38* 37*  CREATININE 0.57*   < > 0.53*   < > 0.51* 0.54* 0.77  --  0.74 0.69  CALCIUM 7.8*   < > 7.5*  --  7.2*  --  7.4*  --  7.3* 7.3*  MG 2.1  --  1.9  --   --   --   --  2.1 2.1  --   PHOS  --   --   --   --  2.5  --   --   --  3.1  --    < > = values in this interval not displayed.   GFR: Estimated Creatinine Clearance: 105.6 mL/min (by C-G formula based on SCr of 0.69 mg/dL). Liver Function Tests: Recent Labs  Lab 12/25/19 1040 12/27/19 0544 12/28/19 0358 12/29/19 0500  AST  --  62* 53* 70*  ALT  --  96* 79* 83*  ALKPHOS  --  819* 701* 803*  BILITOT  --  7.3* 6.4* 6.8*  PROT  --  4.5* 4.2* 4.5*  ALBUMIN 1.5* 2.0* 2.0* 2.4*   Recent Labs  Lab 12/27/19 1038  LIPASE 21  AMYLASE 22*   No results for input(s): AMMONIA in the last 168 hours. Coagulation Profile: No results for  input(s): INR, PROTIME in the last 168 hours. Cardiac Enzymes: No results for input(s): CKTOTAL, CKMB, CKMBINDEX, TROPONINI in the last 168 hours.  BNP (last 3 results) No results for input(s): PROBNP in the last 8760 hours. HbA1C: No results for input(s): HGBA1C in the last 72 hours. CBG: Recent Labs  Lab 12/28/19 1600 12/28/19 2007 12/29/19 0009 12/29/19 0429 12/29/19 0809  GLUCAP 221* 211* 247* 198* 214*   Lipid Profile: No results for input(s): CHOL, HDL, LDLCALC, TRIG, CHOLHDL, LDLDIRECT in the last 72 hours. Thyroid Function Tests: No results for input(s): TSH, T4TOTAL, FREET4, T3FREE, THYROIDAB in the last 72 hours. Anemia Panel: No results for input(s): VITAMINB12, FOLATE, FERRITIN, TIBC, IRON, RETICCTPCT in the last 72 hours. Urine analysis:    Component Value Date/Time   COLORURINE STRAW (A) 11/04/2019 0744   APPEARANCEUR CLEAR 11/04/2019 0744   LABSPEC 1.009 11/04/2019 [place] 7.0 11/04/2019 [place] 11/04/2019 [place] 11/04/2019 [place] 11/04/2019 [place] 11/04/2019 0744   PROTEINUR NEGATIVE 11/04/2019 0744   NITRITE NEGATIVE 11/04/2019 0744   LEUKOCYTESUR NEGATIVE 11/04/2019 0744     Alannah Averhart M.D. [facility] 12/29/2019, 11:24 AM   Call night coverage person covering after 7pm

## 2019-12-29 NOTE — Consult Note (Signed)
[facility] Infectious Disease    Date of Admission:  01/08/2020           Day 5 cefepime       Reason for Consult: Enterobacter and Viridans streptococcal bacteremia    Referring Provider: Dr. Marijo Sanes  Assessment: He had chemotherapy induced neutropenia and developed sepsis on 12/25/2019 with Enterobacter bacteremia.  I suspect that he probably had an intra-abdominal source but cannot rule out a catheter related UTI as no urinalysis or culture were obtained.  He now has viridans strep in one set of blood cultures.  This could represent true, transient bacteremia or an insignificant contaminant.  He has been treated appropriately for both isolates and it is better today.  Plan: 1. Continue cefepime 2. 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 sodium chloride   Intravenous Once  . chlorhexidine  15 mL Mouth Rinse BID  . Chlorhexidine Gluconate Cloth  6 each Topical Daily  . fenofibrate  54 mg Oral Daily  . ferrous sulfate  325 mg Oral Q breakfast  . Gerhardt's butt cream   Topical BID  . insulin aspart  0-6 Units Subcutaneous Q4H  . mouth rinse  15 mL Mouth Rinse q12n4p  . methocarbamol  750 mg Oral TID  . methylPREDNISolone (SOLU-MEDROL) injection  60 mg Intravenous Q12H  . pantoprazole  40 mg Oral BID  . pravastatin  40 mg Oral Daily  . sodium chloride flush  10-40 mL Intracatheter Q12H   Continuous Infusions: . ceFEPime (MAXIPIME) IV Stopped (12/29/19 1341)  . ondansetron [facility]) IV Stopped (12/27/19 0916)   PRN  Meds:.acetaminophen **OR** acetaminophen, alum & mag hydroxide-simeth **AND** [COMPLETED] lidocaine, calcium carbonate, diphenhydrAMINE, lip balm, LORazepam, magic mouthwash w/lidocaine, Melatonin, ondansetron (ZOFRAN) IV, oxyCODONE, prochlorperazine, sodium chloride flush  HPI: Kenneth Khan. is a 54 y.o. 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  He was found to have a T12 pathologic compression fracture, pancreatic and liver masses, leukocytosis and hypercalcemia.  A Port-A-Cath was placed and he was started on FOLFOX and radiation therapy.  He received 1 dose of carboplatin and paclitaxel on 12/17/2019.  He became increasingly weak and debilitated leading to admission on 01/12/2020.  Following his recent chemotherapy he developed pancytopenia with neutropenia with a nadir white blood cell count of 100 on 12/24/2019.  The following day he became hypothermic, tachycardic, hypotensive and developed abdominal distention.  He was moved to the intensive care unit and started on vancomycin and ceftazidime after blood cultures were obtained.  Enterobacter cloacae was isolated from both sets of cultures.  His vancomycin was stopped.  Today the lab reported that viridans strep is growing in 1 set.  He still has some abdominal distention, diffuse abdominal pain and acid reflux but he is feeling better.  He has undergone bilateral thoracenteses.  Pleural fluid is borderline exudative.  No organisms were seen on Gram stain and cultures have been negative.  Review of  Systems: Review of Systems  Constitutional: Positive for malaise/fatigue and weight loss. Negative for chills, diaphoresis and fever.  HENT: Negative for congestion and sore throat.   Respiratory: Positive for shortness of breath. Negative for cough and sputum production.   Cardiovascular: Negative for chest pain.  Gastrointestinal: Positive for abdominal pain, heartburn and nausea.  Negative for diarrhea and vomiting.  Musculoskeletal: Positive for back pain. Negative for myalgias.  Skin: Negative for rash.  Neurological: Negative for headaches.    Past Medical History:  Diagnosis Date  . Cancer ([place])   . Hypertension     Social History   Tobacco Use  . Smoking status: Never Smoker  . Smokeless tobacco: Never Used  Substance Use Topics  . Alcohol use: Yes  . Drug use: Never    Family History  Problem Relation Age of Onset  . Non-Hodgkin's lymphoma Father    No Known Allergies  OBJECTIVE: Blood pressure 109/69, pulse 91, temperature (!) 97.3 F (36.3 C), resp. rate 20, height 5\' 9"  (1.753 m), weight 81.5 kg, SpO2 98 %.  Physical Exam Constitutional:      Comments: He is resting quietly in bed.  He is pleasant and in no distress.  Cardiovascular:     Rate and Rhythm: Normal rate and regular rhythm.     Heart sounds: No murmur.  Pulmonary:     Effort: Pulmonary effort is normal.     Breath sounds: Normal breath sounds.  Chest:    Abdominal:     General: There is distension.     Palpations: Abdomen is soft.     Tenderness: There is abdominal tenderness. There is no guarding.     Comments: He has very mild, diffuse tenderness with palpation.  He has hypoactive bowel sounds.  Genitourinary:    Comments: He has a Foley catheter. Musculoskeletal:     Comments: He has some edema of his right hand.  Skin:    Comments: Scattered petechiae on his forearms.  Neurological:     General: No focal deficit present.  Psychiatric:        Mood and Affect: Mood normal.     Lab Results Lab Results  Component Value Date   WBC 2.0 (L) 12/29/2019   HGB 9.0 (L) 12/29/2019   HCT 29.1 (L) 12/29/2019   MCV 73.7 (L) 12/29/2019   PLT 18 (LL) 12/29/2019    Lab Results  Component Value Date   CREATININE 0.69 12/29/2019   BUN 37 (H) 12/29/2019   NA 143 12/29/2019   K 3.6 12/29/2019   CL 115 (H) 12/29/2019   CO2 18 (L) 12/29/2019    Lab Results    Component Value Date   ALT 83 (H) 12/29/2019   AST 70 (H) 12/29/2019   ALKPHOS 803 (H) 12/29/2019   BILITOT 6.8 (H) 12/29/2019     Microbiology: Recent Results (from the past 240 hour(s))  MRSA PCR Screening     Status: None   Collection Time: 12/25/19  4:37 PM   Specimen: Nasal Mucosa; Nasopharyngeal  Result Value Ref Range Status   MRSA by PCR NEGATIVE NEGATIVE Final    Comment:        The GeneXpert MRSA Assay (FDA approved for NASAL specimens only), is one component of a comprehensive MRSA colonization surveillance program. It is not intended to diagnose MRSA infection nor to guide or monitor treatment for MRSA infections. Performed at [facility], [place] [address] [place], [place] [address]   Culture, blood (Routine X  2) w Reflex to ID Panel     Status: Abnormal   Collection Time: 12/25/19  7:25 PM   Specimen: BLOOD RIGHT ARM  Result Value Ref Range Status   Specimen Description   Final    BLOOD RIGHT ARM Performed at [facility], [address], [place], [place] [address]    Special Requests   Final    BOTTLES DRAWN AEROBIC AND ANAEROBIC Blood Culture adequate volume Performed at [place] [address], [place], [place] [address]    Culture  Setup Time   Final    GRAM NEGATIVE RODS IN BOTH AEROBIC AND ANAEROBIC BOTTLES CRITICAL RESULT CALLED TO, READ BACK BY AND VERIFIED WITH: Seleta Rhymes PharmD 10:55 12/26/19 ([email or web address])    Culture (A)  Final    ENTEROBACTER CLOACAE VIRIDANS STREPTOCOCCUS ORGANISM 2 THE SIGNIFICANCE OF ISOLATING THIS ORGANISM FROM A SINGLE SET OF BLOOD CULTURES WHEN MULTIPLE SETS ARE DRAWN IS UNCERTAIN. PLEASE NOTIFY THE MICROBIOLOGY DEPARTMENT WITHIN ONE WEEK IF SPECIATION AND SENSITIVITIES ARE REQUIRED. Performed at [facility], [place] [address], [place], [place] [address]    Report Status 12/29/2019 FINAL  Final   Organism ID, Bacteria ENTEROBACTER CLOACAE  Final      Susceptibility    Enterobacter cloacae - MIC*    CEFAZOLIN >=64 RESISTANT Resistant     CEFEPIME <=0.12 SENSITIVE Sensitive     CEFTAZIDIME <=1 SENSITIVE Sensitive     CIPROFLOXACIN <=0.25 SENSITIVE Sensitive     GENTAMICIN <=1 SENSITIVE Sensitive     IMIPENEM 0.5 SENSITIVE Sensitive     TRIMETH/SULFA <=20 SENSITIVE Sensitive     PIP/TAZO <=4 SENSITIVE Sensitive     * ENTEROBACTER CLOACAE  Culture, blood (Routine X 2) w Reflex to ID Panel     Status: Abnormal   Collection Time: 12/25/19  7:25 PM   Specimen: BLOOD RIGHT ARM  Result Value Ref Range Status   Specimen Description   Final    BLOOD RIGHT ARM Performed at [facility], [place] [address] [place], [place] [address]    Special Requests   Final    BOTTLES DRAWN AEROBIC ONLY Blood Culture adequate volume Performed at [facility] [address] [place], [place] [address]    Culture  Setup Time   Final    GRAM NEGATIVE RODS AEROBIC BOTTLE ONLY CRITICAL VALUE NOTED.  VALUE IS CONSISTENT WITH PREVIOUSLY REPORTED AND CALLED VALUE.    Culture (A)  Final    ENTEROBACTER CLOACAE SUSCEPTIBILITIES PERFORMED ON PREVIOUS CULTURE WITHIN THE LAST 5 DAYS. Performed at [facility], [place] [address] [place], [place] [address]    Report Status 12/28/2019 FINAL  Final  Blood Culture ID Panel (Reflexed)     Status: Abnormal   Collection Time: 12/25/19  7:25 PM  Result Value Ref Range Status   Enterococcus species NOT DETECTED NOT DETECTED Final   Listeria monocytogenes NOT DETECTED NOT DETECTED Final   Staphylococcus species NOT DETECTED NOT DETECTED Final   Staphylococcus aureus (BCID) NOT DETECTED NOT DETECTED Final   Streptococcus species NOT DETECTED NOT DETECTED Final   Streptococcus agalactiae NOT DETECTED NOT DETECTED Final   Streptococcus pneumoniae NOT DETECTED NOT DETECTED Final   Streptococcus pyogenes NOT DETECTED NOT DETECTED Final   Acinetobacter baumannii NOT DETECTED NOT DETECTED Final   Enterobacteriaceae  species DETECTED (A) NOT DETECTED Final    Comment: Enterobacteriaceae represent a large family of gram-negative bacteria, not a single organism. CRITICAL RESULT  CALLED TO, READ BACK BY AND VERIFIED WITH: Seleta Rhymes PharmD 10:55 12/26/19 ([email or web address])    Enterobacter cloacae complex DETECTED (A) NOT DETECTED Final    Comment: CRITICAL RESULT CALLED TO, READ BACK BY AND VERIFIED WITH: Seleta Rhymes PharmD 10:55 12/26/19 ([email or web address])    Escherichia coli NOT DETECTED NOT DETECTED Final   Klebsiella oxytoca NOT DETECTED NOT DETECTED Final   Klebsiella pneumoniae NOT DETECTED NOT DETECTED Final   Proteus species NOT DETECTED NOT DETECTED Final   Serratia marcescens NOT DETECTED NOT DETECTED Final   Carbapenem resistance NOT DETECTED NOT DETECTED Final   Haemophilus influenzae NOT DETECTED NOT DETECTED Final   Neisseria meningitidis NOT DETECTED NOT DETECTED Final   Pseudomonas aeruginosa NOT DETECTED NOT DETECTED Final   Candida albicans NOT DETECTED NOT DETECTED Final   Candida glabrata NOT DETECTED NOT DETECTED Final   Candida krusei NOT DETECTED NOT DETECTED Final   Candida parapsilosis NOT DETECTED NOT DETECTED Final   Candida tropicalis NOT DETECTED NOT DETECTED Final    Comment: Performed at [facility], [place] [address], [place], [place] [address]  Body fluid culture (includes gram stain)     Status: None (Preliminary result)   Collection Time: 12/27/19  1:54 PM   Specimen: Pleural Fluid  Result Value Ref Range Status   Specimen Description   Final    PLEURAL Performed at [place] [address] [place], [place] [address]    Special Requests   Final    Immunocompromised Performed at [facility], [place] [address], [place], [place] [address]    Gram Stain   Final    RARE WBC PRESENT, PREDOMINANTLY MONONUCLEAR NO ORGANISMS SEEN    Culture   Final    NO GROWTH 2 DAYS Performed at [facility], [place] [address] [place], [place]  [address]    Report Status PENDING  Incomplete  Body fluid culture (includes gram stain)     Status: None (Preliminary result)   Collection Time: 12/28/19 10:48 AM   Specimen: Pleural Fluid  Result Value Ref Range Status   Specimen Description   Final    PLEURAL Performed at [place] [address] [place], [place] [address]    Special Requests   Final    Immunocompromised Performed at [facility], [place] [address] [place], [place] [address]    Gram Stain   Final    FEW WBC PRESENT, PREDOMINANTLY MONONUCLEAR NO ORGANISMS SEEN    Culture   Final    NO GROWTH < 24 HOURS Performed at [facility] [address] [place],  [address]    Report Status PENDING  Incomplete    Michel Bickers, MD [facility] [phone] pager   [phone] cell 12/29/2019, 5:16 PM

## 2019-12-30 ENCOUNTER — Inpatient Hospital Stay (HOSPITAL_COMMUNITY): Payer: Managed Care, Other (non HMO)

## 2019-12-30 DIAGNOSIS — R7881 Bacteremia: Secondary | ICD-10-CM

## 2019-12-30 DIAGNOSIS — R10819 Abdominal tenderness, unspecified site: Secondary | ICD-10-CM

## 2019-12-30 DIAGNOSIS — R58 Hemorrhage, not elsewhere classified: Secondary | ICD-10-CM

## 2019-12-30 LAB — HEMOGLOBIN A1C
Hgb A1c MFr Bld: 6.7 % — ABNORMAL HIGH (ref 4.8–5.6)
Mean Plasma Glucose: 145.59 mg/dL

## 2019-12-30 LAB — COMPREHENSIVE METABOLIC PANEL
ALT: 103 U/L — ABNORMAL HIGH (ref 0–44)
AST: 90 U/L — ABNORMAL HIGH (ref 15–41)
Albumin: 2.2 g/dL — ABNORMAL LOW (ref 3.5–5.0)
Alkaline Phosphatase: 972 U/L — ABNORMAL HIGH (ref 38–126)
Anion gap: 7 (ref 5–15)
BUN: 36 mg/dL — ABNORMAL HIGH (ref 6–20)
CO2: 20 mmol/L — ABNORMAL LOW (ref 22–32)
Calcium: 7.5 mg/dL — ABNORMAL LOW (ref 8.9–10.3)
Chloride: 120 mmol/L — ABNORMAL HIGH (ref 98–111)
Creatinine, Ser: 0.61 mg/dL (ref 0.61–1.24)
GFR calc Af Amer: >60 mL/min (ref >60)
GFR calc non Af Amer: >60 mL/min (ref >60)
Glucose, Bld: 216 mg/dL — ABNORMAL HIGH (ref 70–99)
Potassium: 3.8 mmol/L (ref 3.5–5.1)
Sodium: 147 mmol/L — ABNORMAL HIGH (ref 135–145)
Total Bilirubin: 7.6 mg/dL — ABNORMAL HIGH (ref 0.3–1.2)
Total Protein: 4.5 g/dL — ABNORMAL LOW (ref 6.5–8.1)

## 2019-12-30 LAB — BPAM PLATELET PHERESIS
Blood Product Expiration Date: 202102142359
ISSUE DATE / TIME: 202102130812
Unit Type and Rh: 6200

## 2019-12-30 LAB — GLUCOSE, CAPILLARY
Glucose-Capillary: 132 mg/dL — ABNORMAL HIGH (ref 70–99)
Glucose-Capillary: 134 mg/dL — ABNORMAL HIGH (ref 70–99)
Glucose-Capillary: 142 mg/dL — ABNORMAL HIGH (ref 70–99)
Glucose-Capillary: 161 mg/dL — ABNORMAL HIGH (ref 70–99)
Glucose-Capillary: 190 mg/dL — ABNORMAL HIGH (ref 70–99)
Glucose-Capillary: 215 mg/dL — ABNORMAL HIGH (ref 70–99)
Glucose-Capillary: 237 mg/dL — ABNORMAL HIGH (ref 70–99)

## 2019-12-30 LAB — ECHOCARDIOGRAM COMPLETE
Height: 69 in
Weight: 2874.8 oz

## 2019-12-30 LAB — CBC
HCT: 31.8 % — ABNORMAL LOW (ref 39.0–52.0)
Hemoglobin: 9.4 g/dL — ABNORMAL LOW (ref 13.0–17.0)
MCH: 22.2 pg — ABNORMAL LOW (ref 26.0–34.0)
MCHC: 29.6 g/dL — ABNORMAL LOW (ref 30.0–36.0)
MCV: 75.2 fL — ABNORMAL LOW (ref 80.0–100.0)
Platelets: 9 10*3/uL — CL (ref 150–400)
RBC: 4.23 MIL/uL (ref 4.22–5.81)
RDW: 23.8 % — ABNORMAL HIGH (ref 11.5–15.5)
WBC: 2.4 10*3/uL — ABNORMAL LOW (ref 4.0–10.5)
nRBC: 0 % (ref 0.0–0.2)

## 2019-12-30 LAB — PREPARE PLATELET PHERESIS: Unit division: 0

## 2019-12-30 LAB — TRIGLYCERIDES, BODY FLUIDS: Triglycerides, Fluid: 19 mg/dL

## 2019-12-30 MED ORDER — INSULIN ASPART 100 UNIT/ML ~~LOC~~ SOLN
0.0000 [IU] | Freq: Every day | SUBCUTANEOUS | Status: DC
  Administered 2019-12-31: 23:00:00 2 [IU] via SUBCUTANEOUS

## 2019-12-30 MED ORDER — METHYLPREDNISOLONE SODIUM SUCC 40 MG IJ SOLR
40.0000 mg | Freq: Two times a day (BID) | INTRAMUSCULAR | Status: DC
  Administered 2019-12-30 – 2020-01-02 (×6): 40 mg via INTRAVENOUS
  Filled 2019-12-30 (×6): qty 1

## 2019-12-30 MED ORDER — INSULIN ASPART 100 UNIT/ML ~~LOC~~ SOLN
3.0000 [IU] | Freq: Three times a day (TID) | SUBCUTANEOUS | Status: DC
  Administered 2019-12-30 – 2020-01-02 (×8): 3 [IU] via SUBCUTANEOUS

## 2019-12-30 MED ORDER — PERFLUTREN LIPID MICROSPHERE
1.0000 mL | INTRAVENOUS | Status: AC | PRN
  Administered 2019-12-30: 09:00:00 2 mL via INTRAVENOUS
  Filled 2019-12-30: qty 10

## 2019-12-30 MED ORDER — INSULIN ASPART 100 UNIT/ML ~~LOC~~ SOLN
0.0000 [IU] | Freq: Three times a day (TID) | SUBCUTANEOUS | Status: DC
  Administered 2019-12-30: 09:00:00 5 [IU] via SUBCUTANEOUS
  Administered 2019-12-30: 13:00:00 2 [IU] via SUBCUTANEOUS
  Administered 2019-12-30 – 2019-12-31 (×4): 3 [IU] via SUBCUTANEOUS
  Administered 2020-01-01: 13:00:00 2 [IU] via SUBCUTANEOUS
  Administered 2020-01-01: 08:00:00 5 [IU] via SUBCUTANEOUS
  Administered 2020-01-01 – 2020-01-02 (×2): 2 [IU] via SUBCUTANEOUS
  Administered 2020-01-02: 12:00:00 3 [IU] via SUBCUTANEOUS

## 2019-12-30 NOTE — Progress Notes (Signed)
Brief progress note:  I stopped to check in on Mr. Hoos today.  He was sleeping and his fiancee was not at the bedside.  Will check back tomorrow.  Micheline Rough, MD [facility] [phone]  NO CHARGE NOTE

## 2019-12-30 NOTE — Progress Notes (Signed)
  Echocardiogram 2D Echocardiogram has been performed.  Kenneth Khan 12/30/2019, 9:45 AM

## 2019-12-30 NOTE — Progress Notes (Signed)
[facility]                                                                              Patient Demographics  Kenneth Khan, is a 54 y.o. male, DOB - 1966/07/20, LX:[id number]  Admit date - 01/09/2020   Admitting Physician Kenneth Aloe, MD  Outpatient Primary MD for the patient is Kenneth Rough, PA-C  Outpatient specialists:   LOS - 11  days   Medical records reviewed and are as summarized below:    No chief complaint on file.      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  Patient presented from his oncologist office secondary to severe functional debility.  Hospital course On 2/9, patient was transferred to stepdown unit for hypotension, SIRS.  CCM was consulted.  Patient was also found to have bilateral pleural effusions, status post thoracentesis, right on 2/11, left on 2/12. Blood cultures now growing Streptococcus viridans.  2D echo today.   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  Liver biopsy showed metastatic squamous cell carcinoma.  PET/CT 11/2019 showed mass in pancreas and liver -currently on chemotherapy and radiation therapy -Chemotherapy with carboplatin/paclitaxel started on 2/1. -Unfortunately has severe leukopenia, thrombocytopenia, likely chemotherapy induced cytopenias.  Per oncology, transfuse for platelets less than 10 -Platelets again 9K today, was transfused yesterday.  Oncology notified, per Dr. Learta Codding monitor for any bleeding, holding off transfusion today   Severe generalized debility -In the setting of metastatic cancer, spinal lesions, poor functional  status.  -PT evaluation recommended CIR, when medically ready.   Streptococcus viridans bacteremia -Transfer to SDU on 2/9 for hypotension, sepsis -Patient was placed on albumin 5% for resuscitation, empiric broad-spectrum antibiotics with vancomycin and cefepime, stress dose steroids -Blood cultures 2/9 growing Streptococcus viridans -Continue IV cefepime, 2D echo done today, no obvious vegetations -ID consulted, will await recommendations if TEE needed   Abdominal distention/ileus - Abdominal x-ray on 2/9 showed few air-filled distended small bowel loops in the mid abdomen, question ileus.   -Resolved, patient tolerating diet  Mild respiratory distress, large b/l pleural effusions, anasarca -Respiratory status much better after bilateral thoracentesis -Chest x-ray 2/10 shows large right pleural effusion and moderate to large left pleural effusion with associated passive atelectasis and/or pneumonia involving the lower lobes -Status post right thoracentesis on 2/11, 1.8 L out, status post left thoracentesis on 2/12 800 cc removed.  Cultures negative so far  Cytopenias with leukopenia, thrombocytopenia -Unfortunately has developed severe cytopenia secondary to chemotherapy. - received platelet pheresis on 2/11, 2/13, platelets again 9K today, holding off on transfusion today  Transaminitis, elevated alkaline phosphatase -Likely secondary to metastatic disease  Iron deficiency anemia Likely secondary to blood loss from active malignancy, continue iron supplementation  History of essential hypertension -Off antihypertensives  Pressure injury: POA Buttocks right, left, mid stage II Bilateral heels,  stage I  -Wound care per nursing   Code Status: Full code DVT Prophylaxis: SCD Family Communication: Discussed all imaging results, lab results, explained to the patient's fianc, Kenneth Khan on the phone today.    Disposition Plan: Patient from home, unable to return home with severe  functional debility.  If respiratory status improves and able to endure PT, will need CIR  Time Spent in minutes   25 minutes  Procedures:  Right-sided thoracentesis 2/11 Left-sided thoracentesis 2/12  Consultants:   Oncology CCM CIR Palliative medicine  Antimicrobials:   Anti-infectives (From admission, onward)   Start     Dose/Rate Route Frequency Ordered Stop   12/26/19 0800  vancomycin (VANCOREADY) IVPB 1500 mg/300 mL  Status:  Discontinued     1,500 mg 150 mL/hr over 120 Minutes Intravenous Every 12 hours 12/25/19 1902 12/26/19 1105   12/25/19 1900  ceFEPIme (MAXIPIME) 2 g in sodium chloride 0.9 % 100 mL IVPB     2 g 200 mL/hr over 30 Minutes Intravenous Every 8 hours 12/25/19 1844     12/25/19 1900  vancomycin (VANCOREADY) IVPB 1750 mg/350 mL     1,750 mg 175 mL/hr over 120 Minutes Intravenous  Once 12/25/19 1845 12/25/19 2347         Medications  Scheduled Meds: . chlorhexidine  15 mL Mouth Rinse BID  . Chlorhexidine Gluconate Cloth  6 each Topical Daily  . fenofibrate  54 mg Oral Daily  . ferrous sulfate  325 mg Oral Q breakfast  . Gerhardt's butt cream   Topical BID  . insulin aspart  0-15 Units Subcutaneous TID WC  . insulin aspart  0-5 Units Subcutaneous QHS  . insulin aspart  3 Units Subcutaneous TID WC  . mouth rinse  15 mL Mouth Rinse q12n4p  . methocarbamol  750 mg Oral TID  . methylPREDNISolone (SOLU-MEDROL) injection  60 mg Intravenous Q12H  . pantoprazole  40 mg Oral BID  . pravastatin  40 mg Oral Daily  . sodium chloride flush  10-40 mL Intracatheter Q12H   Continuous Infusions: . ceFEPime (MAXIPIME) IV Stopped (12/30/19 [id number])  . ondansetron [facility]) IV Stopped (12/27/19 0916)   PRN Meds:.acetaminophen **OR** acetaminophen, alum & mag hydroxide-simeth **AND** [COMPLETED] lidocaine, calcium carbonate, diphenhydrAMINE, lip balm, LORazepam, magic mouthwash w/lidocaine, Melatonin, ondansetron (ZOFRAN) IV, oxyCODONE, prochlorperazine, sodium  chloride flush      Subjective:   Kenneth Khan was seen and examined today.  No fevers or chills, appears to have some sundowning.  Pain is controlled, respiratory status is improving after the thoracentesis.  No nausea or vomiting.   Objective:   Vitals:   12/30/19 0800 12/30/19 0900 12/30/19 1000 12/30/19 1100  BP:   (!) 93/58 (!) 93/58  Pulse: 100 97 99 100  Resp: 18 19 19  (!) 26  Temp: 97.7 F (36.5 C) 97.9 F (36.6 C) 97.9 F (36.6 C) 97.9 F (36.6 C)  TempSrc:      SpO2: 99% 99% 99% 98%  Weight:      Height:        Intake/Output Summary (Last 24 hours) at 12/30/2019 1128 Last data filed at 12/30/2019 1030 Gross per 24 hour  Intake 306.42 ml  Output 1125 ml  Net -818.58 ml     Wt Readings from Last 3 Encounters:  12/25/19 81.5 kg  11/19/19 81.5 kg  11/14/19 82.7 kg   Physical Exam  General: Alert and oriented x 3, NAD  Cardiovascular: S1 S2 clear, RRR. 1+ pedal edema b/l  Respiratory:  Decreased breath sound at the bases  Gastrointestinal: Soft, nontender, nondistended, NBS  Ext: 1+ pedal edema bilaterally  Neuro: no new deficits  Musculoskeletal: No cyanosis, clubbing  Skin: No rashes  Psych: Normal affect and demeanor, alert and oriented x3      Data Reviewed:  I have personally reviewed following labs and imaging studies  Micro Results Recent Results (from the past 240 hour(s))  MRSA PCR Screening     Status: None   Collection Time: 12/25/19  4:37 PM   Specimen: Nasal Mucosa; Nasopharyngeal  Result Value Ref Range Status   MRSA by PCR NEGATIVE NEGATIVE Final    Comment:        The GeneXpert MRSA Assay (FDA approved for NASAL specimens only), is one component of a comprehensive MRSA colonization surveillance program. It is not intended to diagnose MRSA infection nor to guide or monitor treatment for MRSA infections. Performed at [facility], [place] [address], [place], [place] [address]   Culture, blood  (Routine X 2) w Reflex to ID Panel     Status: Abnormal   Collection Time: 12/25/19  7:25 PM   Specimen: BLOOD RIGHT ARM  Result Value Ref Range Status   Specimen Description   Final    BLOOD RIGHT ARM Performed at [facility], [place] [address] [place], [place] [address]    Special Requests   Final    BOTTLES DRAWN AEROBIC AND ANAEROBIC Blood Culture adequate volume Performed at [place] [address], [place], [place] [address]    Culture  Setup Time   Final    GRAM NEGATIVE RODS IN BOTH AEROBIC AND ANAEROBIC BOTTLES CRITICAL RESULT CALLED TO, READ BACK BY AND VERIFIED WITH: Seleta Rhymes PharmD 10:55 12/26/19 ([email or web address])    Culture (A)  Final    ENTEROBACTER CLOACAE VIRIDANS STREPTOCOCCUS ORGANISM 2 THE SIGNIFICANCE OF ISOLATING THIS ORGANISM FROM A SINGLE SET OF BLOOD CULTURES WHEN MULTIPLE SETS ARE DRAWN IS UNCERTAIN. PLEASE NOTIFY THE MICROBIOLOGY DEPARTMENT WITHIN ONE WEEK IF SPECIATION AND SENSITIVITIES ARE REQUIRED. Performed at [facility], [place] [address] [place], [place] [address]    Report Status 12/29/2019 FINAL  Final   Organism ID, Bacteria ENTEROBACTER CLOACAE  Final      Susceptibility   Enterobacter cloacae - MIC*    CEFAZOLIN >=64 RESISTANT Resistant     CEFEPIME <=0.12 SENSITIVE Sensitive     CEFTAZIDIME <=1 SENSITIVE Sensitive     CIPROFLOXACIN <=0.25 SENSITIVE Sensitive     GENTAMICIN <=1 SENSITIVE Sensitive     IMIPENEM 0.5 SENSITIVE Sensitive     TRIMETH/SULFA <=20 SENSITIVE Sensitive     PIP/TAZO <=4 SENSITIVE Sensitive     * ENTEROBACTER CLOACAE  Culture, blood (Routine X 2) w Reflex to ID Panel     Status: Abnormal   Collection Time: 12/25/19  7:25 PM   Specimen: BLOOD RIGHT ARM  Result Value Ref Range Status   Specimen Description   Final    BLOOD RIGHT ARM Performed at [facility], [place] [address], [place], [place] [address]    Special Requests   Final    BOTTLES DRAWN AEROBIC ONLY Blood Culture adequate  volume Performed at [place] [address] [place], [place] [address]    Culture  Setup Time   Final    GRAM NEGATIVE RODS AEROBIC BOTTLE ONLY CRITICAL VALUE NOTED.  VALUE IS CONSISTENT WITH PREVIOUSLY REPORTED AND CALLED VALUE.    Culture (A)  Final    ENTEROBACTER CLOACAE SUSCEPTIBILITIES PERFORMED ON PREVIOUS CULTURE WITHIN THE LAST 5 DAYS. Performed at [facility], [place] [address], [place], [place] [address]    Report Status 12/28/2019 FINAL  Final  Blood Culture ID Panel (Reflexed)     Status: Abnormal   Collection Time: 12/25/19  7:25 PM  Result Value Ref Range Status   Enterococcus species NOT DETECTED NOT DETECTED Final   Listeria monocytogenes NOT DETECTED NOT DETECTED Final   Staphylococcus species NOT DETECTED NOT DETECTED Final   Staphylococcus aureus (BCID) NOT DETECTED NOT DETECTED Final   Streptococcus species NOT DETECTED NOT DETECTED Final   Streptococcus agalactiae NOT DETECTED NOT DETECTED Final   Streptococcus pneumoniae NOT DETECTED NOT DETECTED Final   Streptococcus pyogenes NOT DETECTED NOT DETECTED Final   Acinetobacter baumannii NOT DETECTED NOT DETECTED Final   Enterobacteriaceae species DETECTED (A) NOT DETECTED Final    Comment: Enterobacteriaceae represent a large family of gram-negative bacteria, not a single organism. CRITICAL RESULT CALLED TO, READ BACK BY AND VERIFIED WITH: Seleta Rhymes PharmD 10:55 12/26/19 ([email or web address])    Enterobacter cloacae complex DETECTED (A) NOT DETECTED Final    Comment: CRITICAL RESULT CALLED TO, READ BACK BY AND VERIFIED WITH: Seleta Rhymes PharmD 10:55 12/26/19 ([email or web address])    Escherichia coli NOT DETECTED NOT DETECTED Final   Klebsiella oxytoca NOT DETECTED NOT DETECTED Final   Klebsiella pneumoniae NOT DETECTED NOT DETECTED Final   Proteus species NOT DETECTED NOT DETECTED Final   Serratia marcescens NOT DETECTED NOT DETECTED Final   Carbapenem resistance NOT DETECTED NOT DETECTED Final    Haemophilus influenzae NOT DETECTED NOT DETECTED Final   Neisseria meningitidis NOT DETECTED NOT DETECTED Final   Pseudomonas aeruginosa NOT DETECTED NOT DETECTED Final   Candida albicans NOT DETECTED NOT DETECTED Final   Candida glabrata NOT DETECTED NOT DETECTED Final   Candida krusei NOT DETECTED NOT DETECTED Final   Candida parapsilosis NOT DETECTED NOT DETECTED Final   Candida tropicalis NOT DETECTED NOT DETECTED Final    Comment: Performed at [facility], [place] [address] [place], [place] [address]  Body fluid culture (includes gram stain)     Status: None (Preliminary result)   Collection Time: 12/27/19  1:54 PM   Specimen: Pleural Fluid  Result Value Ref Range Status   Specimen Description   Final    PLEURAL Performed at [place] [address], [place], [place] [address]    Special Requests   Final    Immunocompromised Performed at [facility], [place] [address], [place], [place] [address]    Gram Stain   Final    RARE WBC PRESENT, PREDOMINANTLY MONONUCLEAR NO ORGANISMS SEEN    Culture   Final    NO GROWTH 3 DAYS Performed at [facility], [place] [address] [place], [place] [address]    Report Status PENDING  Incomplete  Body fluid culture (includes gram stain)     Status: None (Preliminary result)   Collection Time: 12/28/19 10:48 AM   Specimen: Pleural Fluid  Result Value Ref Range Status   Specimen Description   Final    PLEURAL Performed at [place] [address], [place], [place] [address]    Special Requests   Final    Immunocompromised Performed at [facility], [place] [address] [place], [place] [address]    Gram Stain   Final    FEW WBC PRESENT, PREDOMINANTLY MONONUCLEAR NO ORGANISMS  SEEN    Culture   Final    NO GROWTH 2 DAYS Performed at [facility], [place] [address], [place], [place] [address]    Report Status PENDING  Incomplete    Radiology  Reports DG Chest 1 View  Result Date: 12/27/2019 CLINICAL DATA:  Status post right thoracentesis EXAM: CHEST  1 VIEW COMPARISON:  12/25/2018 FINDINGS: Cardiac shadow is stable. Right chest wall port is again seen. Interval right thoracentesis is noted with resolution of the right-sided effusion. Mild right basilar atelectasis remains. No pneumothorax is seen. Stable left pleural effusion is noted. IMPRESSION: No pneumothorax following right thoracentesis. Electronically Signed   By: Inez Catalina M.D.   On: 12/27/2019 14:13   NM PET Image Initial (PI) Whole Body  Result Date: 12/04/2019 CLINICAL DATA:  Initial treatment strategy for cancer of unknown primary. EXAM: NUCLEAR MEDICINE PET WHOLE BODY TECHNIQUE: 8.89 mCi F-18 FDG was injected intravenously. Full-ring PET imaging was performed from the skull base to thigh after the radiotracer. CT data was obtained and used for attenuation correction and anatomic localization. Fasting blood glucose: 115 mg/dl COMPARISON:  CT chest, abdomen and pelvis from 11/03/2019. FINDINGS: Mediastinal blood pool activity: SUV max 2.37 HEAD/NECK: FDG avid lytic lesion within the right temporal bone measures 1.3 cm and has an SUV max of 9.29. No hypermetabolic lymph nodes within the soft tissues of the neck. Incidental CT findings: none CHEST: Right supraclavicular node measures 9 mm short axis and has an SUV max 4.4. No hypermetabolic axillary, mediastinal, or hilar lymph nodes. Moderate bilateral pleural effusions are identified with overlying passive atelectasis. No hypermetabolic mass within the lungs. Mild asymmetric increased uptake along the pleura within the posterior costophrenic sulcus is identified with SUV max of 5.28. Incidental CT findings: none ABDOMEN/PELVIS: FDG avid mass within the tail of pancreas measures 6.7 x 5.0 cm and has an SUV max of 9.38. The tumor extends up to and may involve the splenic hilum. Extensive liver metastasis.  Lesions are too numerous to  count. Index lesion within left lobe of liver measures 7.4 by 6.2 cm and has an SUV max of 10.2. Posteromedial right hepatic lobe lesion measures 4.2 cm, image 135/4 and has an SUV max of 7.1. The adrenal glands are unremarkable.  No suspicious kidney mass. FDG avid lymph nodes are identified within the upper abdomen. -index peripancreatic node measures 1.4 cm short axis and has an SUV max of 5.9. -index portacaval node measures 1.2 cm within SUV max of 5.16. Small to moderate volume of abdominopelvic ascites identified. Incidental CT findings: Gallstone identified. SKELETON: Multifocal FDG avid bone lesions are noted involving the axial and proximal appendicular skeleton. -expansile, lytic bone lesion with pathologic fracture involving the posterior aspect of the fifth rib measures 2.5 cm and has an SUV max of 8.26. -lucent lesion involving the inferior tip of the body of right scapula measures 1.4 cm with SUV max of 9.1. -lucent lesion within the posterior column of the left acetabulum is also noted within SUV max of 10.24. -lytic lesion involving the L1 vertebra is identified with SUV max of 8.0. Described on MRI from 11/05/2019 is tumor involving T12 and L1 again noted. There has been progression of compression deformity at T12 with FDG avid soft tissue within the canal. This may be of neurologic significance. SUV max within the L1 vertebra is equal to 10.5. Incidental CT findings: none EXTREMITIES: Of potential orthopedic significance is a lytic lesion involving the femoral neck of the  proximal left femur measuring 2.2 cm within SUV max of 5.98. also concerning is a 4.3 cm lytic lesion is identified involving the posterior cortex of the proximal diaphysis of the left femur within SUV max of 9.12 Incidental CT findings: none IMPRESSION: 1. Within the distal tail of pancreas there is a large FDG avid mass worrisome for primary pancreas adenocarcinoma. 2. Extensive FDG avid liver metastasis and upper abdominal  nodal metastasis. 3. Moderate bilateral pleural effusions identified. Increased pleural uptake within the left posterior costophrenic sulcus is noted which may reflect underlying malignant effusion. 4. Multifocal FDG avid lytic bone metastases. Progressive compression deformity involving the T12 vertebra is noted and there is increased, FDG avid soft tissue within the canal at this level. This may be of neurologic significance. FDG avid T1 metastasis also noted corresponding to lucent lesion on the CT images. 5. Left proximal femur FDG avid lytic lesions are noted. Although no pathologic fractures present at this time these may be of orthopedic significance in the future. 6. Hypermetabolic lytic lesion is noted involving the right temporal bone which erodes into the mastoid air cells. Electronically Signed   By: Kerby Moors M.D.   On: 12/04/2019 12:03   DG Chest Port 1 View  Result Date: 12/29/2019 CLINICAL DATA:  Shortness of breath EXAM: PORTABLE CHEST 1 VIEW COMPARISON:  12/28/2019 FINDINGS: Power port remains in place with its tip at the SVC RA junction. Pleural fluid reaccumulation on the left. Small amount of residual pleural air. Small to moderate amount of pleural fluid on the right persists. Associated lower lung pulmonary density. IMPRESSION: Reaccumulating pleural fluid. Lower lung atelectasis. Diminishing pleural air on the left. Electronically Signed   By: Nelson Chimes M.D.   On: 12/29/2019 04:34   DG CHEST PORT 1 VIEW  Result Date: 12/28/2019 CLINICAL DATA:  Status post left thoracentesis. EXAM: PORTABLE CHEST 1 VIEW COMPARISON:  12/27/2019 FINDINGS: Cardiac shadow is stable. Right chest wall port is again seen in satisfactory position. Slight reaccumulation of right-sided pleural effusion is noted. There has been interval left thoracentesis with reduction of the left pleural effusion. Incomplete re-expansion of the left lower lobe is noted. No other focal abnormality is seen. IMPRESSION:  Incomplete re-expansion of the left lower lobe following left thoracentesis. This is not felt to represent a true pneumothorax. Electronically Signed   By: Inez Catalina M.D.   On: 12/28/2019 11:12   DG CHEST PORT 1 VIEW  Result Date: 12/26/2019 CLINICAL DATA:  54 year old with current history of hypertension, metastatic pancreatic cancer, presenting with acute onset of shortness of breath. EXAM: PORTABLE CHEST 1 VIEW COMPARISON:  PET-CT 12/04/2019. CT chest 11/03/2019. Chest x-ray 11/03/2019. FINDINGS: Since the prior examinations, interval development of a large RIGHT pleural effusion and a moderate to large LEFT pleural effusion with associated consolidation in the lower lobes. Lungs otherwise clear. Cardiac silhouette normal in size, unchanged. Pulmonary vascularity normal. RIGHT jugular Port-A-Cath tip projects over the LOWER SVC. IMPRESSION: Large RIGHT pleural effusion and moderate to large LEFT pleural effusion with associated passive atelectasis and/or pneumonia involving the lower lobes. Electronically Signed   By: Evangeline Dakin M.D.   On: 12/26/2019 09:28   DG Abd Portable 1V  Result Date: 12/25/2019 CLINICAL DATA:  Ileus, nausea, vomiting, hypertension, metastatic cancer EXAM: PORTABLE ABDOMEN - 1 VIEW COMPARISON:  Portable exam 0729 hours compared to 12/23/2019 FINDINGS: Scattered gas in colon. Few air-filled distended small bowel loops in mid abdomen. No bowel wall thickening or definite obstruction. Small amount  of gas in rectum. Osseous structures unremarkable. IMPRESSION: Few air-filled distended small bowel loops in the mid abdomen question ileus, little changed. Electronically Signed   By: Lavonia Dana M.D.   On: 12/25/2019 09:34   DG Abd Portable 1V  Result Date: 12/23/2019 CLINICAL DATA:  Abdominal distension. EXAM: PORTABLE ABDOMEN - 1 VIEW COMPARISON:  December 21, 2019. FINDINGS: No colonic dilatation is noted. Stable mild small bowel dilatation is noted most consistent with ileus.  No radio-opaque calculi or other significant radiographic abnormality are seen. IMPRESSION: Stable mild small bowel dilatation is noted most consistent with ileus. Electronically Signed   By: Marijo Conception M.D.   On: 12/23/2019 09:38   DG Abd Portable 1V  Result Date: 12/21/2019 CLINICAL DATA:  Abdominal distension EXAM: PORTABLE ABDOMEN - 1 VIEW COMPARISON:  11/03/2019 FINDINGS: There are gas distended loops of large and small bowel throughout the abdomen. Stomach is also distended. Small to moderate volume of stool within the right hemicolon. Compression fracture of the T12 vertebral body without definite progression from prior. Pathologic nondisplaced fractures of the posterior and lateral aspects of the right eighth rib. IMPRESSION: 1. Gas distended loops of large and small bowel throughout the abdomen. Findings most suggestive of ileus, although developing small bowel obstruction not excluded. 2. Pathologic nondisplaced fractures of the posterior and lateral aspects of the right eighth rib. The posterior fracture component appears acute or subacute. 3. Pathologic compression fracture of the T12 vertebral body, not significantly progressed compared to prior CT. Electronically Signed   By: Davina Poke D.O.   On: 12/21/2019 14:52   ECHOCARDIOGRAM COMPLETE  Result Date: 12/30/2019    ECHOCARDIOGRAM REPORT   Patient Name:   Kenneth Khan. Date of Exam: 12/30/2019 Medical Rec #:  [place]:[id number]          Height:       69.0 in Accession #:    [id number]         Weight:       179.7 lb Date of Birth:  1966-08-31          BSA:          1.97 m Patient Age:    87 years           BP:           99/63 mmHg Patient Gender: M                  HR:           93 bpm. Exam Location:  Inpatient Procedure: 2D Echo, Cardiac Doppler, Color Doppler and Intracardiac            Opacification Agent Indications:    Bacteremia.  History:        Patient has no prior history of Echocardiogram examinations.                 Abnormal  ECG; Signs/Symptoms:Dyspnea, Hypotension and                 Bacteremia. Metastatic cancer. Chemotherapy. Pleural effusion.                 Post thoracentesis.  Sonographer:    Roseanna Rainbow RDCS Referring Phys: [id number]ubl[address] K Sabriel Borromeo  Sonographer Comments: Technically difficult study due to poor echo windows, suboptimal parasternal window, suboptimal apical window and no parasternal window. Image acquisition challenging due to respiratory motion. Extremely difficult study. Extremely limited views due to respiratory motion. IMPRESSIONS  1.  Overall poor quality images.  2. Left ventricular ejection fraction, by estimation, is 60 to 65%. The left ventricle has normal function. The left ventricle has no regional wall motion abnormalities. Left ventricular diastolic parameters were normal.  3. Right ventricular systolic function is normal. The right ventricular size is mildly enlarged.  4. Right atrial size was mildly dilated.  5. The mitral valve is normal in structure and function. No evidence of mitral valve regurgitation. No evidence of mitral stenosis.  6. The aortic valve was not well visualized. Aortic valve regurgitation is not visualized. No aortic stenosis is present.  7. The inferior vena cava is normal in size with greater than 50% respiratory variability, suggesting right atrial pressure of 3 mmHg. FINDINGS  Left Ventricle: Left ventricular ejection fraction, by estimation, is 60 to 65%. The left ventricle has normal function. The left ventricle has no regional wall motion abnormalities. Definity contrast agent was given IV to delineate the left ventricular  endocardial borders. The left ventricular internal cavity size was normal in size. There is no left ventricular hypertrophy. Left ventricular diastolic parameters were normal. Right Ventricle: The right ventricular size is mildly enlarged. No increase in right ventricular wall thickness. Right ventricular systolic function is normal. Left Atrium: Left atrial  size was normal in size. Right Atrium: Right atrial size was mildly dilated. Pericardium: There is no evidence of pericardial effusion. Mitral Valve: The mitral valve is normal in structure and function. There is mild thickening of the mitral valve leaflet(s). Normal mobility of the mitral valve leaflets. Mild mitral annular calcification. No evidence of mitral valve regurgitation. No evidence of mitral valve stenosis. Tricuspid Valve: The tricuspid valve is normal in structure. Tricuspid valve regurgitation is not demonstrated. No evidence of tricuspid stenosis. Aortic Valve: The aortic valve was not well visualized. Aortic valve regurgitation is not visualized. No aortic stenosis is present. Pulmonic Valve: The pulmonic valve was normal in structure. Pulmonic valve regurgitation is not visualized. No evidence of pulmonic stenosis. Aorta: The aortic root is normal in size and structure. Venous: The inferior vena cava is normal in size with greater than 50% respiratory variability, suggesting right atrial pressure of 3 mmHg. IAS/Shunts: No atrial level shunt detected by color flow Doppler.  LEFT VENTRICLE PLAX 2D LVOT diam:     1.60 cm     Diastology LVOT Area:     2.01 cm    LV e' lateral:   5.98 cm/s                            LV E/e' lateral: 8.7                            LV e' medial:    6.53 cm/s LV Volumes (MOD)           LV E/e' medial:  7.9 LV vol d, MOD A2C: 82.1 ml LV vol d, MOD A4C: 61.8 ml LV vol s, MOD A2C: 43.0 ml LV vol s, MOD A4C: 26.1 ml LV SV MOD A2C:     39.1 ml LV SV MOD A4C:     61.8 ml LV SV MOD BP:      38.2 ml RIGHT VENTRICLE RV S prime:     11.70 cm/s TAPSE (M-mode): 1.6 cm LEFT ATRIUM          Index      RIGHT ATRIUM  Index LA Vol (A4C): 8.2 ml 4.15 ml/m RA Area:     10.60 cm                                 RA Volume:   20.90 ml  10.59 ml/m   AORTA Ao Asc diam: 2.80 cm MITRAL VALVE MV Area (PHT): 4.68 cm    SHUNTS MV Decel Time: 162 msec    Systemic Diam: 1.60 cm MV E  velocity: 51.80 cm/s MV A velocity: 52.60 cm/s MV E/A ratio:  0.98 Jenkins Rouge MD Electronically signed by Jenkins Rouge MD Signature Date/Time: 12/30/2019/10:48:51 AM    Final     Lab Data:  CBC: Recent Labs  Lab 12/27/19 0544 12/28/19 0358 12/29/19 0500 12/29/19 1311 12/30/19 0500  WBC 1.6* 1.8* 1.7* 2.0* 2.4*  NEUTROABS  --   --   --  1.7  --   HGB 10.0* 8.6* 8.6* 9.0* 9.4*  HCT 33.4* 29.4* 28.1* 29.1* 31.8*  MCV 75.9* 75.2* 74.1* 73.7* 75.2*  PLT 14* 19* 10* 18* 9*   Basic Metabolic Panel: Recent Labs  Lab 12/24/19 0710 12/24/19 0710 12/25/19 1040 12/25/19 1040 12/26/19 0311 12/27/19 0544 12/27/19 1038 12/28/19 0358 12/29/19 0500 12/30/19 0500  NA 133*   < > 134*  --   --  140  --  142 143 147*  K 4.2   < > 4.2  --   --  3.8  --  3.7 3.6 3.8  CL 107   < > 108  --   --  110  --  113* 115* 120*  CO2 19*   < > 19*  --   --  15*  --  17* 18* 20*  GLUCOSE 107*   < > 110*  --   --  163*  --  228* 250* 216*  BUN 22*   < > 20  --   --  31*  --  38* 37* 36*  CREATININE 0.53*   < > 0.51*   < > 0.54* 0.77  --  0.74 0.69 0.61  CALCIUM 7.5*   < > 7.2*  --   --  7.4*  --  7.3* 7.3* 7.5*  MG 1.9  --   --   --   --   --  2.1 2.1  --   --   PHOS  --   --  2.5  --   --   --   --  3.1  --   --    < > = values in this interval not displayed.   GFR: Estimated Creatinine Clearance: 105.6 mL/min (by C-G formula based on SCr of 0.61 mg/dL). Liver Function Tests: Recent Labs  Lab 12/25/19 1040 12/27/19 0544 12/28/19 0358 12/29/19 0500 12/30/19 0500  AST  --  62* 53* 70* 90*  ALT  --  96* 79* 83* 103*  ALKPHOS  --  819* 701* 803* 972*  BILITOT  --  7.3* 6.4* 6.8* 7.6*  PROT  --  4.5* 4.2* 4.5* 4.5*  ALBUMIN 1.5* 2.0* 2.0* 2.4* 2.2*   Recent Labs  Lab 12/27/19 1038  LIPASE 21  AMYLASE 22*   No results for input(s): AMMONIA in the last 168 hours. Coagulation Profile: No results for input(s): INR, PROTIME in the last 168 hours. Cardiac Enzymes: No results for input(s):  CKTOTAL, CKMB, CKMBINDEX, TROPONINI in the last 168 hours. BNP (last 3 results) No  results for input(s): PROBNP in the last 8760 hours. HbA1C: Recent Labs    12/30/19 0605  HGBA1C 6.7*   CBG: Recent Labs  Lab 12/29/19 1642 12/29/19 1942 12/30/19 0005 12/30/19 0332 12/30/19 0739  GLUCAP 200* 228* 190* 237* 215*   Lipid Profile: No results for input(s): CHOL, HDL, LDLCALC, TRIG, CHOLHDL, LDLDIRECT in the last 72 hours. Thyroid Function Tests: No results for input(s): TSH, T4TOTAL, FREET4, T3FREE, THYROIDAB in the last 72 hours. Anemia Panel: No results for input(s): VITAMINB12, FOLATE, FERRITIN, TIBC, IRON, RETICCTPCT in the last 72 hours. Urine analysis:    Component Value Date/Time   COLORURINE STRAW (A) 11/04/2019 0744   APPEARANCEUR CLEAR 11/04/2019 0744   LABSPEC 1.009 11/04/2019 [place] 7.0 11/04/2019 [place] 11/04/2019 [place] 11/04/2019 [place] 11/04/2019 [place] 11/04/2019 0744   PROTEINUR NEGATIVE 11/04/2019 0744   NITRITE NEGATIVE 11/04/2019 0744   LEUKOCYTESUR NEGATIVE 11/04/2019 0744     Keeghan Mcintire M.D. [facility] 12/30/2019, 11:28 AM   Call night coverage person covering after 7pm

## 2019-12-30 NOTE — Progress Notes (Signed)
CRITICAL VALUE ALERT  Critical Value:  Platelets 9  Date & Time Notied:  12/30/2019 [id number]  Provider Notified: Dr. Tana Coast & Dr. Benay Spice w/ medical oncology  Orders Received/Actions taken: None received, will continue to monitor pt for signs of bleeding.

## 2019-12-30 NOTE — Progress Notes (Signed)
Patient ID: Kenneth Khan., male   DOB: September 16, 1966, 54 y.o.   MRN: [id number]         [facility]  Date of Admission:  01/03/2020           Day 6 cefepime ASSESSMENT: He seems to be improving slowly on therapy for Enterobacter and Verdon streptococcal bacteremia.  His total white blood cell count is up to 2400.  Yesterday his absolute neutrophil count was 1700.  Repeat blood cultures remain negative and TTE is pending.    PLAN: 1. 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 chlorhexidine  15 mL Mouth Rinse BID  . Chlorhexidine Gluconate Cloth  6 each Topical Daily  . fenofibrate  54 mg Oral Daily  . ferrous sulfate  325 mg Oral Q breakfast  . Gerhardt's butt cream   Topical BID  . insulin aspart  0-15 Units Subcutaneous TID WC  . insulin aspart  0-5 Units Subcutaneous QHS  . insulin aspart  3 Units Subcutaneous TID WC  . mouth rinse  15 mL Mouth Rinse q12n4p  . methocarbamol  750 mg Oral TID  . methylPREDNISolone (SOLU-MEDROL) injection  40 mg Intravenous Q12H  . pantoprazole  40 mg Oral BID  . pravastatin  40 mg Oral Daily  . sodium chloride flush  10-40 mL Intracatheter Q12H   Continuous Infusions: . ceFEPime (MAXIPIME) IV Stopped (12/30/19 [id number])  . ondansetron [facility]) IV Stopped (12/27/19 0916)   PRN Meds:.acetaminophen **OR** acetaminophen, alum & mag hydroxide-simeth **AND** [COMPLETED] lidocaine, calcium carbonate, diphenhydrAMINE, lip balm, LORazepam, magic  mouthwash w/lidocaine, Melatonin, ondansetron (ZOFRAN) IV, oxyCODONE, prochlorperazine, sodium chloride flush   SUBJECTIVE: He says that he is still having some diffuse abdominal pain and acid reflux.  Review of Systems: Review of Systems  Unable to perform ROS: Mental acuity    No Known Allergies  OBJECTIVE: Vitals:   12/30/19 0800 12/30/19 0900 12/30/19 1000 12/30/19 1100  BP:   (!) 93/58 (!) 93/58  Pulse: 100 97 99 100  Resp: 18 19 19  (!) 26  Temp: 97.7 F (36.5 C) 97.9 F (36.6 C) 97.9 F (36.6 C) 97.9 F (36.6 C)  TempSrc:      SpO2: 99% 99% 99% 98%  Weight:      Height:       Body mass index is 26.53 kg/m.  Physical Exam Constitutional:      Comments: He is more lethargic today and having difficulty getting his words out.  Cardiovascular:     Rate and Rhythm: Normal rate and regular rhythm.     Heart sounds: No murmur.  Pulmonary:     Effort: Pulmonary effort is normal.     Breath sounds: Normal breath sounds. No wheezing, rhonchi or rales.  Chest:    Abdominal:     General: There is distension.     Palpations: Abdomen is soft.     Tenderness: There is abdominal tenderness. There is no guarding.  Skin:    Comments: Scattered  ecchymoses on forearms.     Lab Results Lab Results  Component Value Date   WBC 2.4 (L) 12/30/2019   HGB 9.4 (L) 12/30/2019   HCT 31.8 (L) 12/30/2019   MCV 75.2 (L) 12/30/2019   PLT 9 (LL) 12/30/2019    Lab Results  Component Value Date   CREATININE 0.61 12/30/2019   BUN 36 (H) 12/30/2019   NA 147 (H) 12/30/2019   K 3.8 12/30/2019   CL 120 (H) 12/30/2019   CO2 20 (L) 12/30/2019    Lab Results  Component Value Date   ALT 103 (H) 12/30/2019   AST 90 (H) 12/30/2019   ALKPHOS 972 (H) 12/30/2019   BILITOT 7.6 (H) 12/30/2019     Microbiology: Recent Results (from the past 240 hour(s))  MRSA PCR Screening     Status: None   Collection Time: 12/25/19  4:37 PM   Specimen: Nasal Mucosa; Nasopharyngeal  Result Value  Ref Range Status   MRSA by PCR NEGATIVE NEGATIVE Final    Comment:        The GeneXpert MRSA Assay (FDA approved for NASAL specimens only), is one component of a comprehensive MRSA colonization surveillance program. It is not intended to diagnose MRSA infection nor to guide or monitor treatment for MRSA infections. Performed at [facility], [place] [address], [place], [place] [address]   Culture, blood (Routine X 2) w Reflex to ID Panel     Status: Abnormal   Collection Time: 12/25/19  7:25 PM   Specimen: BLOOD RIGHT ARM  Result Value Ref Range Status   Specimen Description   Final    BLOOD RIGHT ARM Performed at [facility], [place] [address] [place], [place] [address]    Special Requests   Final    BOTTLES DRAWN AEROBIC AND ANAEROBIC Blood Culture adequate volume Performed at [place] [address], [place], [place] [address]    Culture  Setup Time   Final    GRAM NEGATIVE RODS IN BOTH AEROBIC AND ANAEROBIC BOTTLES CRITICAL RESULT CALLED TO, READ BACK BY AND VERIFIED WITH: Seleta Rhymes PharmD 10:55 12/26/19 ([email or web address])    Culture (A)  Final    ENTEROBACTER CLOACAE VIRIDANS STREPTOCOCCUS ORGANISM 2 THE SIGNIFICANCE OF ISOLATING THIS ORGANISM FROM A SINGLE SET OF BLOOD CULTURES WHEN MULTIPLE SETS ARE DRAWN IS UNCERTAIN. PLEASE NOTIFY THE MICROBIOLOGY DEPARTMENT WITHIN ONE WEEK IF SPECIATION AND SENSITIVITIES ARE REQUIRED. Performed at [facility], [place] [address], [place], [place] [address]    Report Status 12/29/2019 FINAL  Final   Organism ID, Bacteria ENTEROBACTER CLOACAE  Final      Susceptibility   Enterobacter cloacae - MIC*    CEFAZOLIN >=64 RESISTANT Resistant     CEFEPIME <=0.12 SENSITIVE Sensitive     CEFTAZIDIME <=1 SENSITIVE Sensitive     CIPROFLOXACIN <=0.25 SENSITIVE Sensitive     GENTAMICIN <=1 SENSITIVE Sensitive     IMIPENEM 0.5 SENSITIVE Sensitive     TRIMETH/SULFA <=20 SENSITIVE Sensitive     PIP/TAZO  <=4 SENSITIVE Sensitive     * ENTEROBACTER CLOACAE  Culture, blood (Routine X 2) w Reflex to ID Panel     Status: Abnormal   Collection Time: 12/25/19  7:25 PM   Specimen: BLOOD RIGHT ARM  Result Value Ref Range Status   Specimen Description   Final    BLOOD RIGHT ARM Performed at [facility], [place] [address] [place], [place] [address]    Special Requests  Final    BOTTLES DRAWN AEROBIC ONLY Blood Culture adequate volume Performed at [place] [address], [place], [place] [address]    Culture  Setup Time   Final    GRAM NEGATIVE RODS AEROBIC BOTTLE ONLY CRITICAL VALUE NOTED.  VALUE IS CONSISTENT WITH PREVIOUSLY REPORTED AND CALLED VALUE.    Culture (A)  Final    ENTEROBACTER CLOACAE SUSCEPTIBILITIES PERFORMED ON PREVIOUS CULTURE WITHIN THE LAST 5 DAYS. Performed at [facility], [place] [address] [place], [place] [address]    Report Status 12/28/2019 FINAL  Final  Blood Culture ID Panel (Reflexed)     Status: Abnormal   Collection Time: 12/25/19  7:25 PM  Result Value Ref Range Status   Enterococcus species NOT DETECTED NOT DETECTED Final   Listeria monocytogenes NOT DETECTED NOT DETECTED Final   Staphylococcus species NOT DETECTED NOT DETECTED Final   Staphylococcus aureus (BCID) NOT DETECTED NOT DETECTED Final   Streptococcus species NOT DETECTED NOT DETECTED Final   Streptococcus agalactiae NOT DETECTED NOT DETECTED Final   Streptococcus pneumoniae NOT DETECTED NOT DETECTED Final   Streptococcus pyogenes NOT DETECTED NOT DETECTED Final   Acinetobacter baumannii NOT DETECTED NOT DETECTED Final   Enterobacteriaceae species DETECTED (A) NOT DETECTED Final    Comment: Enterobacteriaceae represent a large family of gram-negative bacteria, not a single organism. CRITICAL RESULT CALLED TO, READ BACK BY AND VERIFIED WITH: Seleta Rhymes PharmD 10:55 12/26/19 ([email or web address])    Enterobacter cloacae complex DETECTED (A) NOT DETECTED Final    Comment:  CRITICAL RESULT CALLED TO, READ BACK BY AND VERIFIED WITH: Seleta Rhymes PharmD 10:55 12/26/19 ([email or web address])    Escherichia coli NOT DETECTED NOT DETECTED Final   Klebsiella oxytoca NOT DETECTED NOT DETECTED Final   Klebsiella pneumoniae NOT DETECTED NOT DETECTED Final   Proteus species NOT DETECTED NOT DETECTED Final   Serratia marcescens NOT DETECTED NOT DETECTED Final   Carbapenem resistance NOT DETECTED NOT DETECTED Final   Haemophilus influenzae NOT DETECTED NOT DETECTED Final   Neisseria meningitidis NOT DETECTED NOT DETECTED Final   Pseudomonas aeruginosa NOT DETECTED NOT DETECTED Final   Candida albicans NOT DETECTED NOT DETECTED Final   Candida glabrata NOT DETECTED NOT DETECTED Final   Candida krusei NOT DETECTED NOT DETECTED Final   Candida parapsilosis NOT DETECTED NOT DETECTED Final   Candida tropicalis NOT DETECTED NOT DETECTED Final    Comment: Performed at [facility], [place] [address], [place], [place] [address]  Body fluid culture (includes gram stain)     Status: None (Preliminary result)   Collection Time: 12/27/19  1:54 PM   Specimen: Pleural Fluid  Result Value Ref Range Status   Specimen Description   Final    PLEURAL Performed at [place] [address], [place], [place] [address]    Special Requests   Final    Immunocompromised Performed at [facility], [place] [address] [place], [place] [address]    Gram Stain   Final    RARE WBC PRESENT, PREDOMINANTLY MONONUCLEAR NO ORGANISMS SEEN    Culture   Final    NO GROWTH 3 DAYS Performed at [facility], [place] [address], [place], [place] [address]    Report Status PENDING  Incomplete  Body fluid culture (includes gram stain)     Status: None (Preliminary result)   Collection Time: 12/28/19 10:48 AM   Specimen: Pleural Fluid  Result Value Ref Range Status   Specimen Description  Final    PLEURAL Performed at [facility], [place] [address], [place], [place] [address]    Special Requests   Final    Immunocompromised Performed at [facility], [place] [address] [place], [place] [address]    Gram Stain   Final    FEW WBC PRESENT, PREDOMINANTLY MONONUCLEAR NO ORGANISMS SEEN    Culture   Final    NO GROWTH 2 DAYS Performed at [place] [address], [place], [place] [address]    Report Status PENDING  Incomplete    Michel Bickers, MD [facility] [place] [facility] [phone] pager   [phone] cell 12/30/2019, 11:42 AM

## 2019-12-31 ENCOUNTER — Ambulatory Visit: Payer: Managed Care, Other (non HMO)

## 2019-12-31 ENCOUNTER — Inpatient Hospital Stay: Payer: Managed Care, Other (non HMO)

## 2019-12-31 DIAGNOSIS — I959 Hypotension, unspecified: Secondary | ICD-10-CM

## 2019-12-31 DIAGNOSIS — B955 Unspecified streptococcus as the cause of diseases classified elsewhere: Secondary | ICD-10-CM

## 2019-12-31 LAB — CBC
HCT: 31.7 % — ABNORMAL LOW (ref 39.0–52.0)
HCT: 30.9 % — ABNORMAL LOW (ref 39.0–52.0)
Hemoglobin: 9.3 g/dL — ABNORMAL LOW (ref 13.0–17.0)
Hemoglobin: 9.1 g/dL — ABNORMAL LOW (ref 13.0–17.0)
MCH: 22.2 pg — ABNORMAL LOW (ref 26.0–34.0)
MCH: 22.6 pg — ABNORMAL LOW (ref 26.0–34.0)
MCHC: 29.3 g/dL — ABNORMAL LOW (ref 30.0–36.0)
MCHC: 29.4 g/dL — ABNORMAL LOW (ref 30.0–36.0)
MCV: 75.8 fL — ABNORMAL LOW (ref 80.0–100.0)
MCV: 76.7 fL — ABNORMAL LOW (ref 80.0–100.0)
Platelets: 6 10*3/uL — CL (ref 150–400)
Platelets: 28 10*3/uL — CL (ref 150–400)
RBC: 4.18 MIL/uL — ABNORMAL LOW (ref 4.22–5.81)
RBC: 4.03 MIL/uL — ABNORMAL LOW (ref 4.22–5.81)
RDW: 24 % — ABNORMAL HIGH (ref 11.5–15.5)
RDW: 24.5 % — ABNORMAL HIGH (ref 11.5–15.5)
WBC: 1.9 10*3/uL — ABNORMAL LOW (ref 4.0–10.5)
WBC: 2.1 10*3/uL — ABNORMAL LOW (ref 4.0–10.5)
nRBC: 1 % — ABNORMAL HIGH (ref 0.0–0.2)
nRBC: 1 % — ABNORMAL HIGH (ref 0.0–0.2)

## 2019-12-31 LAB — BODY FLUID CULTURE
Culture: NO GROWTH
Culture: NO GROWTH

## 2019-12-31 LAB — COMPREHENSIVE METABOLIC PANEL
ALT: 107 U/L — ABNORMAL HIGH (ref 0–44)
AST: 81 U/L — ABNORMAL HIGH (ref 15–41)
Albumin: 2 g/dL — ABNORMAL LOW (ref 3.5–5.0)
Alkaline Phosphatase: 931 U/L — ABNORMAL HIGH (ref 38–126)
Anion gap: 9 (ref 5–15)
BUN: 39 mg/dL — ABNORMAL HIGH (ref 6–20)
CO2: 19 mmol/L — ABNORMAL LOW (ref 22–32)
Calcium: 7.7 mg/dL — ABNORMAL LOW (ref 8.9–10.3)
Chloride: 124 mmol/L — ABNORMAL HIGH (ref 98–111)
Creatinine, Ser: 0.56 mg/dL — ABNORMAL LOW (ref 0.61–1.24)
GFR calc Af Amer: >60 mL/min (ref >60)
GFR calc non Af Amer: >60 mL/min (ref >60)
Glucose, Bld: 200 mg/dL — ABNORMAL HIGH (ref 70–99)
Potassium: 4.1 mmol/L (ref 3.5–5.1)
Sodium: 152 mmol/L — ABNORMAL HIGH (ref 135–145)
Total Bilirubin: 7.7 mg/dL — ABNORMAL HIGH (ref 0.3–1.2)
Total Protein: 4.3 g/dL — ABNORMAL LOW (ref 6.5–8.1)

## 2019-12-31 LAB — GLUCOSE, CAPILLARY
Glucose-Capillary: 188 mg/dL — ABNORMAL HIGH (ref 70–99)
Glucose-Capillary: 183 mg/dL — ABNORMAL HIGH (ref 70–99)
Glucose-Capillary: 190 mg/dL — ABNORMAL HIGH (ref 70–99)
Glucose-Capillary: 207 mg/dL — ABNORMAL HIGH (ref 70–99)

## 2019-12-31 LAB — TYPE AND SCREEN
ABO/RH(D): O POS
Antibody Screen: NEGATIVE

## 2019-12-31 LAB — PH, BODY FLUID: pH, Body Fluid: 7.6

## 2019-12-31 MED ORDER — DIGOXIN 0.25 MG/ML IJ SOLN
0.2500 mg | Freq: Every day | INTRAMUSCULAR | Status: AC
  Administered 2019-12-31: 0.25 mg via INTRAVENOUS
  Filled 2019-12-31: qty 2

## 2019-12-31 MED ORDER — SODIUM CHLORIDE 0.9 % IV BOLUS
500.0000 mL | Freq: Once | INTRAVENOUS | Status: AC
  Administered 2019-12-31: 23:00:00 500 mL via INTRAVENOUS

## 2019-12-31 MED ORDER — SODIUM CHLORIDE 0.9% IV SOLUTION: Freq: Once | INTRAVENOUS | Status: AC

## 2019-12-31 NOTE — Progress Notes (Signed)
PROGRESS NOTE    Kenneth Khan.  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  Patient presented from his oncologist office secondary to severe functional debility.  Hospital course On 2/9, patient was transferred to stepdown unit for hypotension, SIRS.  CCM was consulted.  Patient was also found to have bilateral pleural effusions, status post thoracentesis, right on 2/11, left on 2/12. 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  Liver biopsy showed metastatic squamous cell carcinoma.  PET/CT 11/2019 showed mass in pancreas and liver -recently started chemotherapy and radiation therapy per Oncology -Chemotherapy with carboplatin/paclitaxel started on 2/1. -Unfortunately has severe leukopenia, thrombocytopenia, likely chemotherapy induced cytopenias.  Per oncology, transfuse for platelets less than 10k -Platelets noted to be 6k this AM. Per Oncology, will give 1 pack of platelets -Repeat CBC in AM  Severe generalized debility -In the setting of metastatic cancer, spinal lesions, poor  functional status.  -PT evaluation recommended CIR. Given continued state, deemed not appropriate for CIR at this time, although may hopefully eventually become more appropriate -cont with PT as tolerated  Streptococcus viridans bacteremia with sepsis -Transfer to SDU on 2/9 for hypotension, sepsis -Patient was placed on albumin 5% for resuscitation, empiric broad-spectrum antibiotics with vancomycin and cefepime, stress dose steroids -Blood cultures 2/9 growing Streptococcus viridans -Continued IV cefepime, day 6, 2D echo with no obvious vegetations -ID following, ID recs to cont to watch  Abdominal distention/ileus - Abdominal x-ray on 2/9 showed few air-filled distended small bowel loops in the mid abdomen, question ileus.   -Resolved, continue diet as tolerated  Mild respiratory distress, large b/l pleural effusions, anasarca -Respiratory improved following bilateral thoracentesis -Chest x-ray 2/10 shows large right pleural effusion and moderate to large left pleural effusion with associated passive atelectasis and/or pneumonia involving the lower lobes -Status post right thoracentesis on 2/11, 1.8 L out, status post left thoracentesis on 2/12 800 cc removed. Cytology pending -Remains on minimal o2 support. Low threshold for repeat CXR   Cytopenias with leukopenia, thrombocytopenia -Noted to have severe cytopenia secondary to chemotherapy. - received platelet pheresis on 2/11, 2/13 - becoming more thrombocytopenic, per oncology, plan transfuse 1 pack of platelets today  Transaminitis, elevated alkaline phosphatase -Likely secondary to metastatic disease -CT from 12/20 reviewed. Findings of innumerable low-density lesions throughout the liver compatible with metastatic disease - Bili remains over 7 -repeat LFT's in AM  Iron deficiency anemia -Likely secondary to blood loss from active malignancy -Continue with iron supplementation as tolerated  History of essential  hypertension -BP remains soft -currently remains off antihypertensives  Pressure injury: POA Buttocks right, left, mid stage II Bilateral heels, stage I  -continue wound care  per nursing  DVT prophylaxis: SCD's Code Status: Full Family Communication: Pt in room, family not at bedside Disposition Plan: From home, plan d/c SNF vs re-eval for CIR when hemodynamic more stable and respiratory status improves  Consultants:   Oncology  PCCM  CIR  Palliative Care  ID  Procedures:  Right-sided thoracentesis 2/11 Left-sided thoracentesis 2/12  Antimicrobials: Anti-infectives (From admission, onward)   Start     Dose/Rate Route Frequency Ordered Stop   12/26/19 0800  vancomycin (VANCOREADY) IVPB 1500 mg/300 mL  Status:  Discontinued     1,500 mg 150 mL/hr over 120 Minutes Intravenous Every 12 hours 12/25/19 1902 12/26/19 1105   12/25/19 1900  ceFEPIme (MAXIPIME) 2 g in sodium chloride 0.9 % 100 mL IVPB     2 g 200 mL/hr over 30 Minutes Intravenous Every 8 hours 12/25/19 1844     12/25/19 1900  vancomycin (VANCOREADY) IVPB 1750 mg/350 mL     1,750 mg 175 mL/hr over 120 Minutes Intravenous  Once 12/25/19 1845 12/25/19 2347       Subjective: Denies abd or chest pain this AM. Feeling weak  Objective: Vitals:   12/31/19 1330 12/31/19 1400 12/31/19 1500 12/31/19 1600  BP: (!) 96/56 (!) 83/42 (!) 91/51 (!) 90/53  Pulse: 89 94 91 (!) 102  Resp: 12 12 12 15   Temp: (!) 97 F (36.1 C) (!) 97 F (36.1 C) (!) 96.8 F (36 C) (!) 96.4 F (35.8 C)  TempSrc:      SpO2: 96% 97% 98% 98%  Weight:      Height:        Intake/Output Summary (Last 24 hours) at 12/31/2019 1659 Last data filed at 12/31/2019 1530 Gross per 24 hour  Intake 621.1 ml  Output 1050 ml  Net -428.9 ml   Filed Weights   12/25/19 1800  Weight: 81.5 kg    Examination:  General exam: Appears calm and somewhat uncomfortable laying in bed Respiratory system: decreased BS. Respiratory effort  normal. Cardiovascular system: S1 & S2 heard, Regular Gastrointestinal system: Abdomen is nondistended, soft  Central nervous system: Alert and oriented. No focal neurological deficits. Extremities: Symmetric 5 x 5 power. Skin: No rashes, perfused Psychiatry: Judgement and insight appear normal. Mood & affect appropriate.   Data Reviewed: I have personally reviewed following labs and imaging studies  CBC: Recent Labs  Lab 12/28/19 0358 12/29/19 0500 12/29/19 1311 12/30/19 0500 12/31/19 0500  WBC 1.8* 1.7* 2.0* 2.4* 1.9*  NEUTROABS  --   --  1.7  --   --   HGB 8.6* 8.6* 9.0* 9.4* 9.3*  HCT 29.4* 28.1* 29.1* 31.8* 31.7*  MCV 75.2* 74.1* 73.7* 75.2* 75.8*  PLT 19* 10* 18* 9* 6*   Basic Metabolic Panel: Recent Labs  Lab 12/25/19 1040 12/26/19 0311 12/27/19 0544 12/27/19 1038 12/28/19 0358 12/29/19 0500 12/30/19 0500 12/31/19 0500  NA 134*  --  140  --  142 143 147* 152*  K 4.2  --  3.8  --  3.7 3.6 3.8 4.1  CL 108  --  110  --  113* 115* 120* 124*  CO2 19*  --  15*  --  17* 18* 20* 19*  GLUCOSE 110*  --  163*  --  228* 250* 216* 200*  BUN 20  --  31*  --  38* 37* 36* 39*  CREATININE 0.51*   < > 0.77  --  0.74 0.69 0.61 0.56*  CALCIUM 7.2*  --  7.4*  --  7.3* 7.3* 7.5* 7.7*  MG  --   --   --  2.1 2.1  --   --   --   PHOS 2.5  --   --   --  3.1  --   --   --    < > = values in this interval not displayed.   GFR: Estimated Creatinine Clearance: 105.6 mL/min (A) (by C-G formula based on SCr of 0.56 mg/dL (L)). Liver Function Tests: Recent Labs  Lab 12/27/19 0544 12/28/19 0358 12/29/19 0500 12/30/19 0500 12/31/19 0500  AST 62* 53* 70* 90* 81*  ALT 96* 79* 83* 103* 107*  ALKPHOS 819* 701* 803* 972* 931*  BILITOT 7.3* 6.4* 6.8* 7.6* 7.7*  PROT 4.5* 4.2* 4.5* 4.5* 4.3*  ALBUMIN 2.0* 2.0* 2.4* 2.2* 2.0*   Recent Labs  Lab 12/27/19 1038  LIPASE 21  AMYLASE 22*   No results for input(s): AMMONIA in the last 168 hours. Coagulation Profile: No results for  input(s): INR, PROTIME in the last 168 hours. Cardiac Enzymes: No results for input(s): CKTOTAL, CKMB, CKMBINDEX, TROPONINI in the last 168 hours. BNP (last 3 results) No results for input(s): PROBNP in the last 8760 hours. HbA1C: Recent Labs    12/30/19 0605  HGBA1C 6.7*   CBG: Recent Labs  Lab 12/30/19 1539 12/30/19 1955 12/30/19 2132 12/31/19 0744 12/31/19 1209  GLUCAP 161* 134* 132* 188* 183*   Lipid Profile: No results for input(s): CHOL, HDL, LDLCALC, TRIG, CHOLHDL, LDLDIRECT in the last 72 hours. Thyroid Function Tests: No results for input(s): TSH, T4TOTAL, FREET4, T3FREE, THYROIDAB in the last 72 hours. Anemia Panel: No results for input(s): VITAMINB12, FOLATE, FERRITIN, TIBC, IRON, RETICCTPCT in the last 72 hours. Sepsis Labs: Recent Labs  Lab 12/25/19 1835 12/25/19 2119 12/26/19 0311 12/27/19 0544  PROCALCITON 2.14  --  2.76 3.75  LATICACIDVEN 1.9 1.8  --   --     Recent Results (from the past 240 hour(s))  MRSA PCR Screening     Status: None   Collection Time: 12/25/19  4:37 PM   Specimen: Nasal Mucosa; Nasopharyngeal  Result Value Ref Range Status   MRSA by PCR NEGATIVE NEGATIVE Final    Comment:        The GeneXpert MRSA Assay (FDA approved for NASAL specimens only), is one component of a comprehensive MRSA colonization surveillance program. It is not intended to diagnose MRSA infection nor to guide or monitor treatment for MRSA infections. Performed at [facility], [place] [address], [place], [place] [address]   Culture, blood (Routine X 2) w Reflex to ID Panel     Status: Abnormal   Collection Time: 12/25/19  7:25 PM   Specimen: BLOOD RIGHT ARM  Result Value Ref Range Status   Specimen Description   Final    BLOOD RIGHT ARM Performed at [facility], [place] [address], [place], [place] [address]    Special Requests   Final    BOTTLES DRAWN AEROBIC AND ANAEROBIC Blood Culture adequate volume Performed at [place] [address] [place], [place] [address]    Culture  Setup Time   Final    GRAM NEGATIVE RODS IN BOTH AEROBIC AND ANAEROBIC BOTTLES CRITICAL RESULT CALLED TO, READ BACK BY AND VERIFIED WITH: Seleta Rhymes PharmD 10:55 12/26/19 ([email or web address])    Culture (A)  Final    ENTEROBACTER CLOACAE VIRIDANS STREPTOCOCCUS ORGANISM 2 THE SIGNIFICANCE OF ISOLATING THIS ORGANISM FROM A SINGLE SET OF BLOOD CULTURES  WHEN MULTIPLE SETS ARE DRAWN IS UNCERTAIN. PLEASE NOTIFY THE MICROBIOLOGY DEPARTMENT WITHIN ONE WEEK IF SPECIATION AND SENSITIVITIES ARE REQUIRED. Performed at [facility], [place] [address], [place], [place] [address]    Report Status 12/29/2019 FINAL  Final   Organism ID, Bacteria ENTEROBACTER CLOACAE  Final      Susceptibility   Enterobacter cloacae - MIC*    CEFAZOLIN >=64 RESISTANT Resistant     CEFEPIME <=0.12 SENSITIVE Sensitive     CEFTAZIDIME <=1 SENSITIVE Sensitive     CIPROFLOXACIN <=0.25 SENSITIVE Sensitive     GENTAMICIN <=1 SENSITIVE Sensitive     IMIPENEM 0.5 SENSITIVE Sensitive     TRIMETH/SULFA <=20 SENSITIVE Sensitive     PIP/TAZO <=4 SENSITIVE Sensitive     * ENTEROBACTER CLOACAE  Culture, blood (Routine X 2) w Reflex to ID Panel     Status: Abnormal   Collection Time: 12/25/19  7:25 PM   Specimen: BLOOD RIGHT ARM  Result Value Ref Range Status   Specimen Description   Final    BLOOD RIGHT ARM Performed at [facility], [place] [address] [place], [place] [address]    Special Requests   Final    BOTTLES DRAWN AEROBIC ONLY Blood Culture adequate volume Performed at [place] [address] [place], [place] [address]    Culture  Setup Time   Final    GRAM NEGATIVE RODS AEROBIC BOTTLE ONLY CRITICAL VALUE NOTED.  VALUE IS CONSISTENT WITH PREVIOUSLY REPORTED AND CALLED VALUE.    Culture (A)  Final    ENTEROBACTER CLOACAE SUSCEPTIBILITIES PERFORMED ON PREVIOUS CULTURE WITHIN THE LAST 5 DAYS. Performed at [facility], [place] [address], [place],  [address]    Report Status 12/28/2019 FINAL  Final  Blood Culture ID Panel (Reflexed)     Status: Abnormal   Collection Time: 12/25/19  7:25 PM  Result Value Ref Range Status   Enterococcus species NOT DETECTED NOT DETECTED Final   Listeria monocytogenes NOT DETECTED NOT DETECTED Final   Staphylococcus species NOT DETECTED NOT DETECTED Final   Staphylococcus aureus (BCID) NOT DETECTED NOT DETECTED Final   Streptococcus species NOT DETECTED NOT DETECTED Final   Streptococcus agalactiae NOT DETECTED NOT DETECTED Final   Streptococcus pneumoniae NOT DETECTED NOT DETECTED Final   Streptococcus pyogenes NOT DETECTED NOT DETECTED Final   Acinetobacter baumannii NOT DETECTED NOT DETECTED Final   Enterobacteriaceae species DETECTED (A) NOT DETECTED Final    Comment: Enterobacteriaceae represent a large family of gram-negative bacteria, not a single organism. CRITICAL RESULT CALLED TO, READ BACK BY AND VERIFIED WITH: Seleta Rhymes PharmD 10:55 12/26/19 ([email or web address])    Enterobacter cloacae complex DETECTED (A) NOT DETECTED Final    Comment: CRITICAL RESULT CALLED TO, READ BACK BY AND VERIFIED WITH: Seleta Rhymes PharmD 10:55 12/26/19 ([email or web address])    Escherichia coli NOT DETECTED NOT DETECTED Final   Klebsiella oxytoca NOT DETECTED NOT DETECTED Final   Klebsiella pneumoniae NOT DETECTED NOT DETECTED Final   Proteus species NOT DETECTED NOT DETECTED Final   Serratia marcescens NOT DETECTED NOT DETECTED Final   Carbapenem resistance NOT DETECTED NOT DETECTED Final   Haemophilus influenzae NOT DETECTED NOT DETECTED Final   Neisseria meningitidis NOT DETECTED NOT DETECTED Final   Pseudomonas aeruginosa NOT DETECTED NOT DETECTED Final   Candida albicans NOT DETECTED NOT DETECTED Final   Candida glabrata NOT DETECTED NOT DETECTED Final   Candida krusei NOT DETECTED NOT DETECTED Final   Candida parapsilosis NOT  DETECTED NOT DETECTED Final   Candida tropicalis NOT  DETECTED NOT DETECTED Final    Comment: Performed at [facility], [place] [address] [place], [place] [address]  Body fluid culture (includes gram stain)     Status: None   Collection Time: 12/27/19  1:54 PM   Specimen: Pleural Fluid  Result Value Ref Range Status   Specimen Description   Final    PLEURAL Performed at [place] [address] [place], [place] [address]    Special Requests   Final    Immunocompromised Performed at [facility], [place] [address], [place], [place] [address]    Gram Stain   Final    RARE WBC PRESENT, PREDOMINANTLY MONONUCLEAR NO ORGANISMS SEEN    Culture   Final    NO GROWTH 3 DAYS Performed at [facility], [place] [address], [place], [place] [address]    Report Status 12/31/2019 FINAL  Final  Body fluid culture (includes gram stain)     Status: None   Collection Time: 12/28/19 10:48 AM   Specimen: Pleural Fluid  Result Value Ref Range Status   Specimen Description   Final    PLEURAL Performed at [place] [address] [place], [place] [address]    Special Requests   Final    Immunocompromised Performed at [facility], [place] [address] [place], [place] [address]    Gram Stain   Final    FEW WBC PRESENT, PREDOMINANTLY MONONUCLEAR NO ORGANISMS SEEN    Culture   Final    NO GROWTH 3 DAYS Performed at [place] [address] [place], [place] [address]    Report Status 12/31/2019 FINAL  Final  Culture, blood (Routine X 2) w Reflex to ID Panel     Status: None (Preliminary result)   Collection Time: 12/29/19  4:35 PM   Specimen: BLOOD  Result Value Ref Range Status   Specimen Description   Final    BLOOD LEFT WRIST Performed at [place] [address] [place], [place] [address]    Special Requests   Final    BOTTLES DRAWN AEROBIC ONLY Blood Culture adequate volume Performed at [place] [address] [place], [place] [address]    Culture   Final    NO GROWTH 2 DAYS Performed at [place] [address], [place], [place] [address]    Report Status PENDING  Incomplete  Culture, blood (Routine X 2) w Reflex to ID Panel     Status: None (Preliminary result)   Collection Time: 12/29/19  4:40 PM   Specimen: BLOOD LEFT HAND  Result Value Ref Range Status   Specimen Description   Final    BLOOD LEFT HAND Performed at [place] [address] [place], [place] [address]    Special Requests   Final    BOTTLES DRAWN AEROBIC ONLY Blood Culture adequate volume Performed at [place] [address], [place],  [address]    Culture   Final    NO GROWTH 2 DAYS Performed at [place] [address], [place],  [address]    Report Status PENDING  Incomplete     Radiology Studies: ECHOCARDIOGRAM COMPLETE  Result Date: 12/30/2019    ECHOCARDIOGRAM REPORT   Patient Name:   Kenneth Aragonez. Date of Exam: 12/30/2019 Medical Rec #:  [id number]          Height:       69.0 in Accession #:    [id number]         Weight:       179.7 lb Date of Birth:  10/26/1966          BSA:          1.97 m Patient Age:    77 years           BP:           99/63 mmHg Patient Gender: M                  HR:           93 bpm. Exam Location:  Inpatient Procedure: 2D Echo, Cardiac Doppler, Color Doppler and Intracardiac            Opacification Agent Indications:    Bacteremia.  History:        Patient has no prior history of Echocardiogram examinations.                 Abnormal ECG; Signs/Symptoms:Dyspnea, Hypotension and                 Bacteremia. Metastatic cancer. Chemotherapy. Pleural effusion.                 Post thoracentesis.  Sonographer:    Roseanna Rainbow RDCS Referring Phys: [id number] RIPUDEEP K RAI  Sonographer Comments: Technically difficult study due to poor echo windows, suboptimal parasternal window, suboptimal apical window and no  parasternal window. Image acquisition challenging due to respiratory motion. Extremely difficult study. Extremely limited views due to respiratory motion. IMPRESSIONS  1. Overall poor quality images.  2. Left ventricular ejection fraction, by estimation, is 60 to 65%. The left ventricle has normal function. The left ventricle has no regional wall motion abnormalities. Left ventricular diastolic parameters were normal.  3. Right ventricular systolic function is normal. The right ventricular size is mildly enlarged.  4. Right atrial size was mildly dilated.  5. The mitral valve is normal in structure and function. No evidence of mitral valve regurgitation. No evidence of mitral stenosis.  6. The aortic valve was not well visualized. Aortic valve regurgitation is not visualized. No aortic stenosis is present.  7. The inferior vena cava is normal in size with greater than 50% respiratory variability, suggesting right atrial pressure of 3 mmHg. FINDINGS  Left Ventricle: Left ventricular ejection fraction, by estimation, is 60 to 65%. The left ventricle has normal function. The left ventricle has no regional wall motion abnormalities. Definity contrast agent was given IV to delineate the left ventricular  endocardial borders. The left ventricular internal cavity size was normal in size. There is no left ventricular hypertrophy. Left ventricular diastolic parameters were normal. Right Ventricle: The right ventricular size is mildly enlarged. No increase in right ventricular wall thickness. Right ventricular systolic function is normal. Left Atrium: Left atrial size was normal in size. Right Atrium: Right atrial size was mildly dilated. Pericardium: There is no evidence of pericardial effusion. Mitral Valve: The mitral valve is normal in structure and function. There is mild thickening of the mitral valve leaflet(s). Normal mobility of the mitral valve leaflets. Mild mitral annular calcification. No evidence of mitral valve  regurgitation. No evidence of mitral valve stenosis. Tricuspid Valve: The tricuspid valve is normal in structure. Tricuspid valve regurgitation is not demonstrated. No evidence of tricuspid stenosis. Aortic Valve: The  aortic valve was not well visualized. Aortic valve regurgitation is not visualized. No aortic stenosis is present. Pulmonic Valve: The pulmonic valve was normal in structure. Pulmonic valve regurgitation is not visualized. No evidence of pulmonic stenosis. Aorta: The aortic root is normal in size and structure. Venous: The inferior vena cava is normal in size with greater than 50% respiratory variability, suggesting right atrial pressure of 3 mmHg. IAS/Shunts: No atrial level shunt detected by color flow Doppler.  LEFT VENTRICLE PLAX 2D LVOT diam:     1.60 cm     Diastology LVOT Area:     2.01 cm    LV e' lateral:   5.98 cm/s                            LV E/e' lateral: 8.7                            LV e' medial:    6.53 cm/s LV Volumes (MOD)           LV E/e' medial:  7.9 LV vol d, MOD A2C: 82.1 ml LV vol d, MOD A4C: 61.8 ml LV vol s, MOD A2C: 43.0 ml LV vol s, MOD A4C: 26.1 ml LV SV MOD A2C:     39.1 ml LV SV MOD A4C:     61.8 ml LV SV MOD BP:      38.2 ml RIGHT VENTRICLE RV S prime:     11.70 cm/s TAPSE (M-mode): 1.6 cm LEFT ATRIUM          Index      RIGHT ATRIUM           Index LA Vol (A4C): 8.2 ml 4.15 ml/m RA Area:     10.60 cm                                 RA Volume:   20.90 ml  10.59 ml/m   AORTA Ao Asc diam: 2.80 cm MITRAL VALVE MV Area (PHT): 4.68 cm    SHUNTS MV Decel Time: 162 msec    Systemic Diam: 1.60 cm MV E velocity: 51.80 cm/s MV A velocity: 52.60 cm/s MV E/A ratio:  0.98 Jenkins Rouge MD Electronically signed by Jenkins Rouge MD Signature Date/Time: 12/30/2019/10:48:51 AM    Final     Scheduled Meds: . chlorhexidine  15 mL Mouth Rinse BID  . Chlorhexidine Gluconate Cloth  6 each Topical Daily  . fenofibrate  54 mg Oral Daily  . ferrous sulfate  325 mg Oral Q breakfast  .  Gerhardt's butt cream   Topical BID  . insulin aspart  0-15 Units Subcutaneous TID WC  . insulin aspart  0-5 Units Subcutaneous QHS  . insulin aspart  3 Units Subcutaneous TID WC  . mouth rinse  15 mL Mouth Rinse q12n4p  . methocarbamol  750 mg Oral TID  . methylPREDNISolone (SOLU-MEDROL) injection  40 mg Intravenous Q12H  . pantoprazole  40 mg Oral BID  . pravastatin  40 mg Oral Daily  . sodium chloride flush  10-40 mL Intracatheter Q12H   Continuous Infusions: . ceFEPime (MAXIPIME) IV Stopped (12/31/19 1543)  . ondansetron (ZOFRAN) IV Stopped (12/27/19 [id number])     LOS: 12 days   Marylu Lund, MD [facility] Pager On Amion  If 7PM-7AM, please contact night-coverage 12/31/2019,  4:59 PM

## 2019-12-31 NOTE — Progress Notes (Signed)
Inpatient Rehabilitation-Admissions Coordinator   [facility] continues to follow at a distance. Pt has not been able to participate in therapies since 2/7 due to medical decline. At this time, pt is not appropriate for CIR. Will follow as his medical workup continues with hope pt may eventually become appropriate. Also need to await further [place] meeting to help identify pt/family wishes regarding the possibility of intensive rehab.   Raechel Ache, OTR/L  Rehab Admissions Coordinator  [phone] 12/31/2019 4:51 PM

## 2019-12-31 NOTE — Progress Notes (Signed)
Pharmacy Antibiotic Note  Quindarious Tensley. is a 54 y.o. male admit 2/3 from [facility] with hx pancreatic mass, SqCellCa of liver & bone. Noted hypotension, tachycardia, ambulatory dysfx (concern for continuing XRT - planned end date 2/12). Hx T12 comp fx C1 Carbo/Taxol 2/1, did not receive Filgrastim scheduled for 2/4  12/31/2019  D6 full abx. Cefepime for complex bacteremia with Enterobacter cloacae + Strep viridans WBC 1.9, SCr 0.56, AF/hypothermic S/p R thoracentesis 2/11> 1.8 L, consider PleurX cath if recurrent effusion  Plan:  Continue Cefepime 2gm IV q8h  IV PPI> Po  Height: 5\' 9"  (175.3 cm) Weight: 179 lb 10.8 oz (81.5 kg) IBW/kg (Calculated) : 70.7  Temp (24hrs), Avg:97.4 F (36.3 C), Min:96.6 F (35.9 C), Max:97.9 F (36.6 C)  Recent Labs  Lab 12/25/19 1835 12/25/19 2119 12/26/19 0311 12/27/19 0544 12/27/19 0544 12/28/19 0358 12/29/19 0500 12/29/19 1311 12/30/19 0500 12/31/19 0500  WBC  --   --    < > 1.6*   < > 1.8* 1.7* 2.0* 2.4* 1.9*  CREATININE  --   --    < > 0.77  --  0.74 0.69  --  0.61 0.56*  LATICACIDVEN 1.9 1.8  --   --   --   --   --   --   --   --    < > = values in this interval not displayed.    Estimated Creatinine Clearance: 105.6 mL/min (A) (by C-G formula based on SCr of 0.56 mg/dL (L)).    No Known Allergies   Antimicrobials this admission:  2/9 vanc >>  2/10 2/9 cefepime >>   Dose adjustments this admission:   Microbiology results:  2/9 BCx x2:  3 bottles with GNR (BCID= enterobacter cloacae complex) R ancef, sens to all other tested  2/9 MRSA PCR: negative  Thank you for allowing pharmacy to be a part of this patient's care.  Minda Ditto PharmD 12/31/2019, 9:59 AM

## 2019-12-31 NOTE — Progress Notes (Addendum)
INFECTIOUS DISEASE PROGRESS NOTE  ID: [name]. is a 54 y.o. 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  No complaints.  Denies diarrhea, dysphagia.  Per nursing- mucoid stools, clear. Poor po.   Abtx:  Anti-infectives (From admission, onward)   Start     Dose/Rate Route Frequency Ordered Stop   12/26/19 0800  vancomycin (VANCOREADY) IVPB 1500 mg/300 mL  Status:  Discontinued     1,500 mg 150 mL/hr over 120 Minutes Intravenous Every 12 hours 12/25/19 1902 12/26/19 1105   12/25/19 1900  ceFEPIme (MAXIPIME) 2 g in sodium chloride 0.9 % 100 mL IVPB     2 g 200 mL/hr over 30 Minutes Intravenous Every 8 hours 12/25/19 1844     12/25/19 1900  vancomycin (VANCOREADY) IVPB 1750 mg/350 mL     1,750 mg 175 mL/hr over 120 Minutes Intravenous  Once 12/25/19 1845 12/25/19 2347      Medications:  Scheduled: . sodium chloride   Intravenous Once  . chlorhexidine  15 mL Mouth Rinse BID  . Chlorhexidine Gluconate Cloth  6 each Topical Daily  . fenofibrate  54 mg Oral Daily  . ferrous sulfate  325 mg Oral Q breakfast  . Gerhardt's butt cream   Topical BID  . insulin aspart  0-15 Units Subcutaneous TID WC  . insulin aspart  0-5 Units Subcutaneous QHS  . insulin aspart  3 Units Subcutaneous TID WC  . mouth rinse  15 mL Mouth Rinse q12n4p  . methocarbamol  750 mg Oral TID  . methylPREDNISolone (SOLU-MEDROL) injection  40 mg Intravenous Q12H  . pantoprazole  40 mg Oral BID  . pravastatin  40 mg Oral Daily  . sodium chloride flush  10-40 mL Intracatheter Q12H     Objective: Vital signs in last 24 hours: Temp:  [96.6 F (35.9 C)-97.5 F (36.4 C)] 97 F (36.1 C) (02/15 1330) Pulse Rate:  [84-100] 89 (02/15 1330) Resp:  [10-22] 12 (02/15 1330) BP: (84-103)/(45-65) 96/56 (02/15 1330) SpO2:  [96 %-100 %] 96 % (02/15 1330)   General appearance: fatigued, icteric and no distress Resp: clear to auscultation bilaterally Chest wall: no tenderness, R chest port, clean, non-tender, no fluctuance.  Cardio: regular rate and rhythm GI: normal findings: bowel sounds normal and soft, non-tender  Lab Results Recent Labs    12/30/19 0500 12/31/19 0500  WBC 2.4* 1.9*  HGB 9.4* 9.3*  HCT 31.8* 31.7*  NA 147* 152*  K 3.8 4.1  CL 120* 124*  CO2 20* 19*  BUN 36* 39*  CREATININE 0.61 0.56*   Liver Panel Recent Labs    12/30/19 0500 12/31/19 0500  PROT 4.5* 4.3*  ALBUMIN 2.2* 2.0*  AST 90* 81*  ALT 103* 107*  ALKPHOS 972* 931*  BILITOT 7.6* 7.7*   Sedimentation Rate No results for input(s): ESRSEDRATE in the last 72 hours. C-Reactive Protein No results for input(s): CRP in the last 72 hours.  Microbiology: Recent Results (from the past 240 hour(s))  MRSA PCR Screening     Status: None   Collection  Time: 12/25/19  4:37 PM   Specimen: Nasal Mucosa; Nasopharyngeal  Result Value Ref Range Status   MRSA by PCR NEGATIVE NEGATIVE Final    Comment:        The GeneXpert MRSA Assay (FDA approved for NASAL specimens only), is one component of a comprehensive MRSA colonization surveillance program. It is not intended to diagnose MRSA infection nor to guide or monitor treatment for MRSA infections. Performed at [facility], [place] [address], [place], [place] [address]   Culture, blood (Routine X 2) w Reflex to ID Panel     Status: Abnormal   Collection Time: 12/25/19  7:25 PM   Specimen: BLOOD RIGHT ARM  Result Value Ref Range Status   Specimen Description   Final    BLOOD RIGHT ARM Performed at [place], [place] [address], [place], [place] [address]    Special Requests   Final    BOTTLES DRAWN AEROBIC AND ANAEROBIC Blood Culture adequate volume Performed at [place] [address] [place], [place] [address]    Culture  Setup Time   Final    GRAM NEGATIVE RODS IN BOTH AEROBIC AND ANAEROBIC BOTTLES CRITICAL RESULT CALLED TO, READ BACK BY AND VERIFIED WITH: Seleta Rhymes PharmD 10:55 12/26/19 ([email or web address])    Culture (A)  Final    ENTEROBACTER CLOACAE VIRIDANS STREPTOCOCCUS ORGANISM 2 THE SIGNIFICANCE OF ISOLATING THIS ORGANISM FROM A SINGLE SET OF BLOOD CULTURES WHEN MULTIPLE SETS ARE DRAWN IS UNCERTAIN. PLEASE NOTIFY THE MICROBIOLOGY DEPARTMENT WITHIN ONE WEEK IF SPECIATION AND SENSITIVITIES ARE REQUIRED. Performed at [facility], [place] [address], [place], [place] [address]    Report Status 12/29/2019 FINAL  Final   Organism ID, Bacteria ENTEROBACTER CLOACAE  Final      Susceptibility   Enterobacter cloacae - MIC*    CEFAZOLIN >=64 RESISTANT Resistant     CEFEPIME <=0.12 SENSITIVE Sensitive     CEFTAZIDIME <=1 SENSITIVE Sensitive     CIPROFLOXACIN <=0.25 SENSITIVE Sensitive     GENTAMICIN <=1 SENSITIVE Sensitive     IMIPENEM 0.5 SENSITIVE Sensitive     TRIMETH/SULFA <=20 SENSITIVE Sensitive     PIP/TAZO <=4 SENSITIVE Sensitive     * ENTEROBACTER CLOACAE  Culture, blood (Routine X 2) w Reflex to ID Panel     Status: Abnormal   Collection Time: 12/25/19  7:25 PM   Specimen: BLOOD RIGHT ARM  Result Value Ref Range Status   Specimen Description   Final    BLOOD RIGHT ARM Performed at [facility], [place] [address], [place], [place] [address]    Special Requests   Final    BOTTLES DRAWN AEROBIC ONLY Blood Culture adequate volume Performed at [place] [address] [place], [place] [address]    Culture  Setup Time   Final    GRAM NEGATIVE RODS AEROBIC BOTTLE ONLY CRITICAL VALUE NOTED.  VALUE IS CONSISTENT WITH PREVIOUSLY REPORTED  AND CALLED VALUE.    Culture (A)  Final    ENTEROBACTER CLOACAE SUSCEPTIBILITIES PERFORMED ON PREVIOUS CULTURE WITHIN THE LAST 5 DAYS. Performed at [facility], [place] [address], [place], [place] [address]    Report Status 12/28/2019 FINAL  Final  Blood Culture ID Panel (Reflexed)     Status: Abnormal   Collection Time: 12/25/19  7:25 PM  Result Value Ref Range Status   Enterococcus species NOT DETECTED NOT DETECTED Final   Listeria monocytogenes NOT DETECTED NOT DETECTED  Final   Staphylococcus species NOT DETECTED NOT DETECTED Final   Staphylococcus aureus (BCID) NOT DETECTED NOT DETECTED Final   Streptococcus species NOT DETECTED NOT DETECTED Final   Streptococcus agalactiae NOT DETECTED NOT DETECTED Final   Streptococcus pneumoniae NOT DETECTED NOT DETECTED Final   Streptococcus pyogenes NOT DETECTED NOT DETECTED Final   Acinetobacter baumannii NOT DETECTED NOT DETECTED Final   Enterobacteriaceae species DETECTED (A) NOT DETECTED Final    Comment: Enterobacteriaceae represent a large family of gram-negative bacteria, not a single organism. CRITICAL RESULT CALLED TO, READ BACK BY AND VERIFIED WITH: Seleta Rhymes PharmD 10:55 12/26/19 ([email or web address])    Enterobacter cloacae complex DETECTED (A) NOT DETECTED Final    Comment: CRITICAL RESULT CALLED TO, READ BACK BY AND VERIFIED WITH: Seleta Rhymes PharmD 10:55 12/26/19 ([email or web address])    Escherichia coli NOT DETECTED NOT DETECTED Final   Klebsiella oxytoca NOT DETECTED NOT DETECTED Final   Klebsiella pneumoniae NOT DETECTED NOT DETECTED Final   Proteus species NOT DETECTED NOT DETECTED Final   Serratia marcescens NOT DETECTED NOT DETECTED Final   Carbapenem resistance NOT DETECTED NOT DETECTED Final   Haemophilus influenzae NOT DETECTED NOT DETECTED Final   Neisseria meningitidis NOT DETECTED NOT DETECTED Final   Pseudomonas aeruginosa NOT DETECTED NOT DETECTED Final   Candida albicans NOT DETECTED NOT DETECTED Final   Candida glabrata NOT  DETECTED NOT DETECTED Final   Candida krusei NOT DETECTED NOT DETECTED Final   Candida parapsilosis NOT DETECTED NOT DETECTED Final   Candida tropicalis NOT DETECTED NOT DETECTED Final    Comment: Performed at [facility], [place] [address], [place], [place] [address]  Body fluid culture (includes gram stain)     Status: None   Collection Time: 12/27/19  1:54 PM   Specimen: Pleural Fluid  Result Value Ref Range Status   Specimen Description   Final    PLEURAL Performed at [place] [address] [place], [place] [address]    Special Requests   Final    Immunocompromised Performed at [facility], [place] [address], [place], [place] [address]    Gram Stain   Final    RARE WBC PRESENT, PREDOMINANTLY MONONUCLEAR NO ORGANISMS SEEN    Culture   Final    NO GROWTH 3 DAYS Performed at [facility], [place] [address], [place], [place] [address]    Report Status 12/31/2019 FINAL  Final  Body fluid culture (includes gram stain)     Status: None   Collection Time: 12/28/19 10:48 AM   Specimen: Pleural Fluid  Result Value Ref Range Status   Specimen Description   Final    PLEURAL Performed at [place] [address], [place], [place] [address]    Special Requests   Final    Immunocompromised Performed at [facility], [place] [address], [place], [place] [address]    Gram Stain   Final    FEW WBC PRESENT, PREDOMINANTLY MONONUCLEAR NO ORGANISMS SEEN    Culture   Final    NO GROWTH 3 DAYS Performed at [place] [address], [place],  [address]    Report Status 12/31/2019 FINAL  Final  Culture, blood (Routine X 2) w Reflex to ID Panel     Status: None (Preliminary result)   Collection Time: 12/29/19  4:35 PM   Specimen: BLOOD  Result Value Ref Range Status   Specimen Description   Final  BLOOD LEFT WRIST Performed at [place] [address],  [place], [place] [address]    Special Requests   Final    BOTTLES DRAWN AEROBIC ONLY Blood Culture adequate volume Performed at [place] [address] [place], [place] [address]    Culture   Final    NO GROWTH 2 DAYS Performed at [place] [address], [place], [place] [address]    Report Status PENDING  Incomplete  Culture, blood (Routine X 2) w Reflex to ID Panel     Status: None (Preliminary result)   Collection Time: 12/29/19  4:40 PM   Specimen: BLOOD LEFT HAND  Result Value Ref Range Status   Specimen Description   Final    BLOOD LEFT HAND Performed at [place] [address], [place], [place] [address]    Special Requests   Final    BOTTLES DRAWN AEROBIC ONLY Blood Culture adequate volume Performed at [place] [address], [place], [place] [address]    Culture   Final    NO GROWTH 2 DAYS Performed at [place] [address], [place],  [address]    Report Status PENDING  Incomplete    Studies/Results: ECHOCARDIOGRAM COMPLETE  Result Date: 12/30/2019    ECHOCARDIOGRAM REPORT   Patient Name:   Daveon Jafari. Date of Exam: 12/30/2019 Medical Rec #:  [id number]          Height:       69.0 in Accession #:    [id number]         Weight:       179.7 lb Date of Birth:  [birth year]          BSA:          1.97 m Patient Age:    40 years           BP:           99/63 mmHg Patient Gender: M                  HR:           93 bpm. Exam Location:  Inpatient Procedure: 2D Echo, Cardiac Doppler, Color Doppler and Intracardiac            Opacification Agent Indications:    Bacteremia.  History:        Patient has no prior history of Echocardiogram examinations.                 Abnormal ECG; Signs/Symptoms:Dyspnea, Hypotension and                 Bacteremia. Metastatic cancer. Chemotherapy. Pleural effusion.                 Post thoracentesis.  Sonographer:    Roseanna Rainbow RDCS Referring Phys: [id number]  RIPUDEEP K RAI  Sonographer Comments: Technically difficult study due to poor echo windows, suboptimal parasternal window, suboptimal apical window and no parasternal window. Image acquisition challenging due to respiratory motion. Extremely difficult study. Extremely limited views due to respiratory motion. IMPRESSIONS  1. Overall poor quality images.  2. Left ventricular ejection fraction, by estimation, is 60 to 65%. The left ventricle has normal function. The left ventricle has no regional wall motion abnormalities. Left ventricular diastolic parameters were normal.  3. Right ventricular systolic function is normal. The right ventricular size is mildly enlarged.  4. Right  atrial size was mildly dilated.  5. The mitral valve is normal in structure and function. No evidence of mitral valve regurgitation. No evidence of mitral stenosis.  6. The aortic valve was not well visualized. Aortic valve regurgitation is not visualized. No aortic stenosis is present.  7. The inferior vena cava is normal in size with greater than 50% respiratory variability, suggesting right atrial pressure of 3 mmHg. FINDINGS  Left Ventricle: Left ventricular ejection fraction, by estimation, is 60 to 65%. The left ventricle has normal function. The left ventricle has no regional wall motion abnormalities. Definity contrast agent was given IV to delineate the left ventricular  endocardial borders. The left ventricular internal cavity size was normal in size. There is no left ventricular hypertrophy. Left ventricular diastolic parameters were normal. Right Ventricle: The right ventricular size is mildly enlarged. No increase in right ventricular wall thickness. Right ventricular systolic function is normal. Left Atrium: Left atrial size was normal in size. Right Atrium: Right atrial size was mildly dilated. Pericardium: There is no evidence of pericardial effusion. Mitral Valve: The mitral valve is normal in structure and function. There is  mild thickening of the mitral valve leaflet(s). Normal mobility of the mitral valve leaflets. Mild mitral annular calcification. No evidence of mitral valve regurgitation. No evidence of mitral valve stenosis. Tricuspid Valve: The tricuspid valve is normal in structure. Tricuspid valve regurgitation is not demonstrated. No evidence of tricuspid stenosis. Aortic Valve: The aortic valve was not well visualized. Aortic valve regurgitation is not visualized. No aortic stenosis is present. Pulmonic Valve: The pulmonic valve was normal in structure. Pulmonic valve regurgitation is not visualized. No evidence of pulmonic stenosis. Aorta: The aortic root is normal in size and structure. Venous: The inferior vena cava is normal in size with greater than 50% respiratory variability, suggesting right atrial pressure of 3 mmHg. IAS/Shunts: No atrial level shunt detected by color flow Doppler.  LEFT VENTRICLE PLAX 2D LVOT diam:     1.60 cm     Diastology LVOT Area:     2.01 cm    LV e' lateral:   5.98 cm/s                            LV E/e' lateral: 8.7                            LV e' medial:    6.53 cm/s LV Volumes (MOD)           LV E/e' medial:  7.9 LV vol d, MOD A2C: 82.1 ml LV vol d, MOD A4C: 61.8 ml LV vol s, MOD A2C: 43.0 ml LV vol s, MOD A4C: 26.1 ml LV SV MOD A2C:     39.1 ml LV SV MOD A4C:     61.8 ml LV SV MOD BP:      38.2 ml RIGHT VENTRICLE RV S prime:     11.70 cm/s TAPSE (M-mode): 1.6 cm LEFT ATRIUM          Index      RIGHT ATRIUM           Index LA Vol (A4C): 8.2 ml 4.15 ml/m RA Area:     10.60 cm                                 RA  Volume:   20.90 ml  10.59 ml/m   AORTA Ao Asc diam: 2.80 cm MITRAL VALVE MV Area (PHT): 4.68 cm    SHUNTS MV Decel Time: 162 msec    Systemic Diam: 1.60 cm MV E velocity: 51.80 cm/s MV A velocity: 52.60 cm/s MV E/A ratio:  0.98 Jenkins Rouge MD Electronically signed by Jenkins Rouge MD Signature Date/Time: 12/30/2019/10:48:51 AM    Final      Assessment/Plan: SCCa of unclear  origin  CTX 12-17-19 (Carbo/Paclitaxel)   Next dose tentatively scheduled for 01-07-20 Pancytopenia Streptococcal bacteremia 1/4 (2-9) Enterobacter bacteremia 3/4 (2-9) Thoracentesis R 2-11,  L 2-12  Cx pending.  Repeat BCx 2-13 pending  Total days of antibiotics: 6 cefepime  He appears to be clearing his BCx Suspect strep was contaminant? His repeat is ngtd He is still hypotensive and cytopenic.  His TTE was unrevealing.  Will continue to watch         Bobby Rumpf MD, FACP Infectious Diseases (pager) [phone] [email or web address] 12/31/2019, 1:56 PM  LOS: 12 days

## 2019-12-31 NOTE — Progress Notes (Signed)
Palliative Care Progress Note  Reason for visit: Kenneth Khan in light of cancer, bacteremia; psychosocial support  I met at the bedside with Kenneth Khan and his Kenneth Khan.  Dr. Lorenso Khan was also present for portion of conversation as well.  We discussed his clinical course over the weekend.  Kenneth Khan is distraught by how much weaker he appears today after having a good weekend in regard to his mental status.  He was sleepy and agitated and did not participate in conversation.  We discussed what is most important to Kenneth Khan.  Kenneth Khan reports that his friends and family are most important to him.  Kenneth Khan reports that that he has been a great source of strength for her.  He was her support when her father died 2 years ago.  They have know each other for years from working together at a [employer].  He has always been a hard working man of great integrity.  Dr. Lorenso Khan and I gently discussed his multiple comorbid conditions and concern about his condition.  Kenneth Khan is understanding of the severity of the situation but she is emotionally overwhelmed and reports it is "like a bad dream."    - Full code/ Full scope treatment - Kenneth Khan and his fiancee understand that he has uncurable illness but are invested in plan to continue with aggressive care and all offered interventions. - He has reported that his fiancee, Kenneth Khan, should be his surrogate Kenneth Khan.  Appreciate spiritual care helping facilitate completion of documents. - Continued gentle discussion regarding working on long term goals based upon what is most important to him in light of his medical condition.  - Palliative to continue to follow and progress conversation based upon his clinical course.  Total time: 50 minutes    Greater than 50%  of this time was spent counseling and coordinating care related to the above assessment and plan.  Kenneth Rough, MD [place] Team [phone]

## 2019-12-31 NOTE — Progress Notes (Addendum)
Pt with bilateral pleural effusions, s/p thoracentesis, right on 2/11, left on 2/12. Blood cultures now growing Streptococcus viridans. 2D echo yesterday. Pt has not been able to participate in therapy due to medical issues. D/C plan is CIR if pt is able to tolerate therapy. CIR MD needs to evaluate pt. Will continue to f/u to assist with the D/C plan.

## 2019-12-31 NOTE — Progress Notes (Signed)
PT Cancellation Note  Patient Details Name: Kenneth Khan. MRN: [id number] DOB: 22-Aug-1966   Cancelled Treatment:    Reason Eval/Treat Not Completed: Medical issues which prohibited therapy,Platelets 6 today. Per PT clinical guidelines, warrants cancelling therapy unless MD gives clearance to mobilize. Will check back another time.    Claretha Cooper 12/31/2019, 7:25 AM  [place] Pager [phone] Office [phone]

## 2019-12-31 NOTE — Progress Notes (Signed)
Warming blanket placed on patient due to temperature of 96.4 F that is not rising with multiple warm blankets placed on patient.

## 2019-12-31 NOTE — Progress Notes (Signed)
OT Cancellation Note  Patient Details Name: Kenneth Khan. MRN: [id number] DOB: Jun 23, 1966   Cancelled Treatment:     Medical issues prohibiting therapy, platelets 6. Per therapy guidelines, hold therapy unless MD provides clearance to mobilize patient. Will re-check as schedule permits.  [place] OT office: [place] 12/31/2019, 8:41 AM

## 2019-12-31 NOTE — Progress Notes (Addendum)
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 Noted to have lab abnormalities. CT Thoracic Spine/Lumbar shows lesion to T1 and compression fracture of T12. CT C/A/P showed 4 x 5 cm ill-defined hypoechoic mass within the pancreatic tail and innumerable low-density lesions throughout the liver noted and compatible with metastatic disease. Noted to have microcytic anemia and hypercalcemia to 13.8.  2) 11/05/2019: underwent US guided biopsy of liver lesion. Pathology confirmed metastatic squamous cell carcinoma. Requested EGD to confirm esophageal primary.  3) 11/21/2019: patient underwent EGD and sigmoidoscopy. No clear evidence of malignancy noted on these procedures. 4) 12/04/2019: PET CT scan showed mass in pancreas and liver, but no alternative sites of primary tumor.  5) 12/17/2019: Cycle 1 Day 1 of Carboplatin/Paclitaxel  6) 12/20/2019: Admitted to [facility] for worsening functional status.   HISTORY OF PRESENTING ILLNESS:  Kenneth Khan. 54 y.o. male with medical history significant for metastatic squamous cell cancer of unclear origin who was admitted for pain control and marked deconditioning. In the interim since his last visit he began chemotherapy on 12/17/2019.  On exam today Kenneth Khan more somnolent.  He is sleeping when I arrived, but opens eyes to my voice.  He really could not answer any of my questions however.  He indicated that he was not having any pain or shortness of breath.  Spoke with nursing who reports that he she has not seen any bleeding.  Additional review of systems cannot be obtained.  MEDICAL HISTORY:  Past Medical History:    Diagnosis Date   Cancer ([place])    Hypertension    ALLERGIES:  has No Known Allergies.  MEDICATIONS:  Current Facility-Administered Medications  Medication Dose Route Frequency Provider Last Rate Last Admin   0.9 %  sodium chloride infusion (Manually program via Guardrails IV Fluids)   Intravenous Once Maryanna Shape, NP       acetaminophen (TYLENOL) tablet 650 mg  650 mg Oral Q6H PRN Mariel Aloe, MD   650 mg at 12/20/19 2059   Or   acetaminophen (TYLENOL) suppository 650 mg  650 mg Rectal Q6H PRN Mariel Aloe, MD       alum & mag hydroxide-simeth (MAALOX/MYLANTA) 200-200-20 MG/5ML suspension 30 mL  30 mL Oral Q6H PRN Mariel Aloe, MD   30 mL at 12/30/19 1108   calcium carbonate (TUMS - dosed in mg elemental calcium) chewable tablet 200 mg of elemental calcium  1 tablet Oral TID WC PRN Mariel Aloe, MD   200 mg of elemental calcium at 12/30/19 1052   ceFEPIme (MAXIPIME) 2 g in sodium chloride 0.9 % 100 mL IVPB  2 g Intravenous Q8H Poindexter, Leann T, RPH   Stopped at 12/31/19 [id number]   chlorhexidine (PERIDEX) 0.12 % solution 15 mL  15 mL Mouth Rinse BID Rai, Ripudeep K, MD   15 mL at 12/31/19 0942   Chlorhexidine Gluconate Cloth 2 % PADS 6 each  6 each Topical Daily Mariel Aloe, MD   6 each at 12/31/19 0942   diphenhydrAMINE (BENADRYL) injection 12.5 mg  12.5 mg Intravenous Q6H PRN Rai, Ripudeep K, MD       fenofibrate tablet 54 mg  54 mg  Oral Daily Mariel Aloe, MD   54 mg at 12/31/19 [id number]   ferrous sulfate tablet 325 mg  325 mg Oral Q breakfast Mariel Aloe, MD   325 mg at 12/31/19 [id number]   Gerhardt's butt cream   Topical BID Mendel Corning, MD   Given at 12/31/19 0943   insulin aspart (novoLOG) injection 0-15 Units  0-15 Units Subcutaneous TID WC Rai, Ripudeep K, MD   3 Units at 12/31/19 0836   insulin aspart (novoLOG) injection 0-5 Units  0-5 Units Subcutaneous QHS Rai, Ripudeep K, MD       insulin aspart (novoLOG) injection 3 Units  3 Units Subcutaneous TID WC Rai,  Ripudeep K, MD   3 Units at 12/30/19 1644   lip balm (CARMEX) ointment   Topical PRN Mariel Aloe, MD       LORazepam (ATIVAN) tablet 0.5 mg  0.5 mg Oral Q6H PRN Mariel Aloe, MD   0.5 mg at 12/28/19 [id number]   magic mouthwash w/lidocaine  5 mL Oral QID PRN Rai, Ripudeep K, MD   5 mL at 12/30/19 1030   MEDLINE mouth rinse  15 mL Mouth Rinse q12n4p Rai, Ripudeep K, MD   15 mL at 12/30/19 1241   Melatonin TABS 6 mg  6 mg Oral QHS PRN [name], NP   6 mg at 12/30/19 2126   methocarbamol (ROBAXIN) tablet 750 mg  750 mg Oral TID Mariel Aloe, MD   750 mg at 12/31/19 0942   methylPREDNISolone sodium succinate (SOLU-MEDROL) 40 mg/mL injection 40 mg  40 mg Intravenous Q12H Rai, Ripudeep K, MD   40 mg at 12/31/19 0942   ondansetron (ZOFRAN) 8 mg in sodium chloride 0.9 % 50 mL IVPB  8 mg Intravenous Q8H PRN Mariel Aloe, MD   Stopped at 12/27/19 [phone]   oxyCODONE (Oxy IR/ROXICODONE) immediate release tablet 5-10 mg  5-10 mg Oral Q6H PRN Mariel Aloe, MD   10 mg at 12/31/19 0956   pantoprazole (PROTONIX) EC tablet 40 mg  40 mg Oral BID Leodis Sias T, RPH   40 mg at 12/31/19 0942   pravastatin (PRAVACHOL) tablet 40 mg  40 mg Oral Daily Mariel Aloe, MD   40 mg at 12/31/19 [id number]   prochlorperazine (COMPAZINE) tablet 10 mg  10 mg Oral Q6H PRN Mariel Aloe, MD       sodium chloride flush (NS) 0.9 % injection 10-40 mL  10-40 mL Intracatheter Q12H [name] IV, MD   10 mL at 12/31/19 0943   sodium chloride flush (NS) 0.9 % injection 10-40 mL  10-40 mL Intracatheter PRN Narda Rutherford T IV, MD   10 mL at 12/25/19 2148    Vitals:   12/31/19 0900 12/31/19 1000  BP: (!) 92/52 (!) 94/51  Pulse: 100 97  Resp: 16 20  Temp: (!) 97 F (36.1 C) (!) 97.2 F (36.2 C)  SpO2: 100% 98%   Filed Weights   12/25/19 1800  Weight: 179 lb 10.8 oz (81.5 kg)    GENERAL: chronically ill appearing Caucasian male in NAD, somnolent. SKIN: skin color, texture, turgor are normal, no rashes or  significant lesions EYES: conjunctiva are pink and non-injected, sclera clear RESP: Diminished in the bases CV: regular rhythm EXT: 2+ pitting edema in the bilateral upper and lower extremities NEURO: no focal motor/sensory deficits  LABORATORY DATA:  I have reviewed the data as listed CBC Latest Ref Rng & Units 12/31/2019 12/30/2019  12/29/2019  WBC 4.0 - 10.5 K/uL 1.9(L) 2.4(L) 2.0(L)  Hemoglobin 13.0 - 17.0 g/dL 9.3(L) 9.4(L) 9.0(L)  Hematocrit 39.0 - 52.0 % 31.7(L) 31.8(L) 29.1(L)  Platelets 150 - 400 K/uL 6(LL) 9(LL) 18(LL)    CMP Latest Ref Rng & Units 12/31/2019 12/30/2019 12/29/2019  Glucose 70 - 99 mg/dL 200(H) 216(H) 250(H)  BUN 6 - 20 mg/dL 39(H) 36(H) 37(H)  Creatinine 0.61 - 1.24 mg/dL 0.56(L) 0.61 0.69  Sodium 135 - 145 mmol/L 152(H) 147(H) 143  Potassium 3.5 - 5.1 mmol/L 4.1 3.8 3.6  Chloride 98 - 111 mmol/L 124(H) 120(H) 115(H)  CO2 22 - 32 mmol/L 19(L) 20(L) 18(L)  Calcium 8.9 - 10.3 mg/dL 7.7(L) 7.5(L) 7.3(L)  Total Protein 6.5 - 8.1 g/dL 4.3(L) 4.5(L) 4.5(L)  Total Bilirubin 0.3 - 1.2 mg/dL 7.7(H) 7.6(H) 6.8(H)  Alkaline Phos 38 - 126 U/L 931(H) 972(H) 803(H)  AST 15 - 41 U/L 81(H) 90(H) 70(H)  ALT 0 - 44 U/L 107(H) 103(H) 83(H)     ASSESSMENT & PLAN Kenneth Khan. 54 y.o. male with medical history significant for metastatic squamous cell cancer of unclear origin who was admitted for pain control and marked deconditioning. In the interim since his last visit he began chemotherapy on 12/17/2019. His chemotherapy regimen consists of Carbo/Paclitaxel.   The patient has had a rapid decline in his condition since admission.  More somnolent today blood cultures positive for Enterobacter and he remains on IV antibiotics.  ID has been consulted.  He remains afebrile.  Status post right thoracentesis on 12/17/2019 and left thoracentesis on 12/28/2019.  Cytology on both of the samples is currently pending.  We will continue to monitor his situation while he is in the  ICU.  #Hypotension #Tachycardia --patient transferred to the ICU for marked increase in HR and decreased blood pressure --Not requiring pressor support --More somnolent today --On IV antibiotics given neutropenia and positive blood cultures --Palliative care following the patient given his rapid change in status and condition to discuss goals of care.  Note that he plans to name his fiance as his healthcare power of attorney.  # Deconditioning/Unsafe at Home --patient is no longer functionally ambulatory and requires complete assistance moving from vehicle to wheelchair and the restroom. --his fiancee can no longer safely care for him at home and our services are no longer able to lift him into his vehicle to go home.  --agree to being admitted for easy transport to and from radiation therapy and for receiving physical therapy. He will need to be in house until at least 12/28/2019. --plan for d/c to SNF or Acute rehab once radiation therapy is complete.  #Abdominal Pain/Heartburn --pain is improving, though in the interim he had an increase to stress dose steroids.  --continue protonix 40mg  IV q12H --continue to monitor Hgb closely  --Amylase and lipase normal  #Iron Deficiency Anemia --drops in Hgb and iron levels thought to be 2/2 to tumor in the GI tract, though EGD/colonscopy saw no discrete bleeding lesions --continue PO iron therapy  #Leukocytosis, resolved.  --neutrophilic predominance, consistent with leukocytosis of malignancy and worsened by steroid use --held GCSF therapy in setting of elevated WBC --continue to monitor   #Leukopenia #Thrombocytopenia --these represent chemotherapy induced cytopenias. No indication for GCSF now, though will consider addition for next cycle.  --continue to monitor with daily CBC. Transfuse for Plt <10 --We will give 1 unit of platelets today and repeat a CBC 1 hour posttransfusion  # Metastatic Squamous Cell Cancer,  Uncertain  Primary --started Cycle 1 of Carbo/Paclitaxel on 12/17/2019. GCSF shot held due to marked leukocytosis at time it was scheduled.  --last CT scan on 11/03/2019 with PET on 12/04/2019 Will plan for repeat CT scan in March 2020 -- Cycle 2 of chemotherapy was scheduled for 01/07/2020, though this will not be feasible. Will plan to delay indefinitely. Can continually re-evaluate to determine if he becomes a candidate for further treatment.   #Constipation, resolved #Diarrhea --agree with holding bowel regimen per primary team  #Large right pleural effusion and moderate to large left pleural effusion --Status post right thoracentesis on 12/27/2019 and left thoracentesis on 12/28/2019 --We will follow-up on cytology.  He may need a repeat thoracentesis.  Appreciate the care of the hospitalist team. Oncology will continue to follow while he is inpatient.   Mikey Bussing, DNP, AGPCNP-BC, AOCNP   12/31/2019 11:29 AM

## 2020-01-01 ENCOUNTER — Ambulatory Visit: Payer: Managed Care, Other (non HMO)

## 2020-01-01 ENCOUNTER — Inpatient Hospital Stay (HOSPITAL_COMMUNITY): Payer: Managed Care, Other (non HMO)

## 2020-01-01 DIAGNOSIS — C787 Secondary malignant neoplasm of liver and intrahepatic bile duct: Principal | ICD-10-CM

## 2020-01-01 DIAGNOSIS — R748 Abnormal levels of other serum enzymes: Secondary | ICD-10-CM

## 2020-01-01 LAB — BPAM PLATELET PHERESIS
Blood Product Expiration Date: 202102182359
ISSUE DATE / TIME: 202102151311
Unit Type and Rh: 6200

## 2020-01-01 LAB — COMPREHENSIVE METABOLIC PANEL
ALT: 105 U/L — ABNORMAL HIGH (ref 0–44)
AST: 104 U/L — ABNORMAL HIGH (ref 15–41)
Albumin: 1.9 g/dL — ABNORMAL LOW (ref 3.5–5.0)
Alkaline Phosphatase: 949 U/L — ABNORMAL HIGH (ref 38–126)
Anion gap: 9 (ref 5–15)
BUN: 44 mg/dL — ABNORMAL HIGH (ref 6–20)
CO2: 18 mmol/L — ABNORMAL LOW (ref 22–32)
Calcium: 7.4 mg/dL — ABNORMAL LOW (ref 8.9–10.3)
Chloride: 125 mmol/L — ABNORMAL HIGH (ref 98–111)
Creatinine, Ser: 0.73 mg/dL (ref 0.61–1.24)
GFR calc Af Amer: >60 mL/min (ref >60)
GFR calc non Af Amer: >60 mL/min (ref >60)
Glucose, Bld: 205 mg/dL — ABNORMAL HIGH (ref 70–99)
Potassium: 4.3 mmol/L (ref 3.5–5.1)
Sodium: 152 mmol/L — ABNORMAL HIGH (ref 135–145)
Total Bilirubin: 8.5 mg/dL — ABNORMAL HIGH (ref 0.3–1.2)
Total Protein: 4.3 g/dL — ABNORMAL LOW (ref 6.5–8.1)

## 2020-01-01 LAB — CBC
HCT: 29.6 % — ABNORMAL LOW (ref 39.0–52.0)
Hemoglobin: 8.8 g/dL — ABNORMAL LOW (ref 13.0–17.0)
MCH: 22.7 pg — ABNORMAL LOW (ref 26.0–34.0)
MCHC: 29.7 g/dL — ABNORMAL LOW (ref 30.0–36.0)
MCV: 76.5 fL — ABNORMAL LOW (ref 80.0–100.0)
Platelets: 21 10*3/uL — CL (ref 150–400)
RBC: 3.87 MIL/uL — ABNORMAL LOW (ref 4.22–5.81)
RDW: 24.7 % — ABNORMAL HIGH (ref 11.5–15.5)
WBC: 2.2 10*3/uL — ABNORMAL LOW (ref 4.0–10.5)
nRBC: 1.8 % — ABNORMAL HIGH (ref 0.0–0.2)

## 2020-01-01 LAB — URINALYSIS, MICROSCOPIC (REFLEX): Squamous Epithelial / HPF: NONE SEEN (ref 0–5)

## 2020-01-01 LAB — GLUCOSE, CAPILLARY
Glucose-Capillary: 208 mg/dL — ABNORMAL HIGH (ref 70–99)
Glucose-Capillary: 142 mg/dL — ABNORMAL HIGH (ref 70–99)
Glucose-Capillary: 128 mg/dL — ABNORMAL HIGH (ref 70–99)
Glucose-Capillary: 127 mg/dL — ABNORMAL HIGH (ref 70–99)

## 2020-01-01 LAB — URINALYSIS, ROUTINE W REFLEX MICROSCOPIC
Glucose, UA: NEGATIVE mg/dL
Leukocytes,Ua: NEGATIVE
Nitrite: NEGATIVE
Protein, ur: 100 mg/dL — AB
Specific Gravity, Urine: 1.015 (ref 1.005–1.030)
pH: 6 (ref 5.0–8.0)

## 2020-01-01 LAB — CYTOLOGY - NON PAP

## 2020-01-01 LAB — PREPARE PLATELET PHERESIS: Unit division: 0

## 2020-01-01 MED ORDER — KATE FARMS STANDARD 1.4 PO LIQD
325.0000 mL | ORAL | Status: DC
  Administered 2020-01-02: 09:00:00 325 mL via ORAL
  Filled 2020-01-01 (×2): qty 325

## 2020-01-01 MED ORDER — PANTOPRAZOLE SODIUM 40 MG PO PACK: 40.0000 mg | PACK | Freq: Two times a day (BID) | ORAL | Status: DC

## 2020-01-01 MED ORDER — ALBUMIN HUMAN 25 % IV SOLN
25.0000 g | Freq: Once | INTRAVENOUS | Status: AC
  Administered 2020-01-01: 11:00:00 25 g via INTRAVENOUS
  Filled 2020-01-01: qty 50

## 2020-01-01 MED ORDER — RESOURCE INSTANT PROTEIN PO PWD PACKET
2.0000 | Freq: Three times a day (TID) | ORAL | Status: DC
  Administered 2020-01-01 – 2020-01-02 (×3): 12 g via ORAL
  Filled 2020-01-01 (×5): qty 12

## 2020-01-01 MED ORDER — ADULT MULTIVITAMIN W/MINERALS CH
1.0000 | ORAL_TABLET | Freq: Every day | ORAL | Status: DC
  Administered 2020-01-02: 09:00:00 1 via ORAL
  Filled 2020-01-01: qty 1

## 2020-01-01 MED ORDER — PANTOPRAZOLE SODIUM 40 MG PO PACK
40.0000 mg | PACK | Freq: Two times a day (BID) | ORAL | Status: DC
  Administered 2020-01-01 – 2020-01-02 (×2): 40 mg via ORAL
  Filled 2020-01-01 (×2): qty 20

## 2020-01-01 MED ORDER — VANCOMYCIN HCL 1500 MG/300ML IV SOLN
1500.0000 mg | Freq: Two times a day (BID) | INTRAVENOUS | Status: DC
  Administered 2020-01-01 – 2020-01-02 (×3): 1500 mg via INTRAVENOUS
  Filled 2020-01-01 (×4): qty 300

## 2020-01-01 MED ORDER — METRONIDAZOLE IN NACL 5-0.79 MG/ML-% IV SOLN
500.0000 mg | Freq: Three times a day (TID) | INTRAVENOUS | Status: DC
  Administered 2020-01-01 – 2020-01-02 (×4): 500 mg via INTRAVENOUS
  Filled 2020-01-01 (×5): qty 100

## 2020-01-01 NOTE — Progress Notes (Signed)
Inpatient Rehabilitation-Admissions Coordinator   Noted PT and OT have signed off due to medical decline. Pt is not appropriate for IP Rehab at this time. AC will sign off.   Please call if questions.   Raechel Ache, OTR/L  Rehab Admissions Coordinator  [phone] 01/01/2020 1:24 PM

## 2020-01-01 NOTE — Progress Notes (Signed)
INFECTIOUS DISEASE PROGRESS NOTE  ID: Kenneth Khan. is a 54 y.o. 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 [facility])   Port-A-Cath in place   Ambulatory dysfunction   Pressure injury of skin   Sepsis without acute organ dysfunction (HCC)   Pancytopenia (HCC)   Hypotension due to hypovolemia   Bilateral pleural effusion   Status post thoracentesis   Bacteremia due to Enterobacter species   Streptococcal bacteremia  Subjective: Awakens transiently  Abtx:  Anti-infectives (From admission, onward)   Start     Dose/Rate Route Frequency Ordered Stop   01/01/20 0900  vancomycin (VANCOREADY) IVPB 1500 mg/300 mL     1,500 mg 150 mL/hr over 120 Minutes Intravenous Every 12 hours 01/01/20 0759     01/01/20 0800  metroNIDAZOLE (FLAGYL) IVPB 500 mg     500 mg 100 mL/hr over 60 Minutes Intravenous Every 8 hours 01/01/20 0736     12/26/19 0800  vancomycin (VANCOREADY) IVPB 1500 mg/300 mL  Status:  Discontinued     1,500 mg 150 mL/hr over 120 Minutes Intravenous Every 12 hours 12/25/19 1902 12/26/19 1105   12/25/19 1900  ceFEPIme (MAXIPIME) 2 g in sodium chloride 0.9 % 100 mL IVPB     2 g 200 mL/hr over 30 Minutes Intravenous Every 8 hours 12/25/19 1844     12/25/19 1900  vancomycin (VANCOREADY) IVPB 1750 mg/350 mL     1,750 mg 175 mL/hr over 120 Minutes Intravenous  Once 12/25/19 1845 12/25/19 2347      Medications:  Scheduled: . chlorhexidine  15 mL Mouth Rinse BID  . Chlorhexidine Gluconate Cloth  6 each Topical Daily  . feeding supplement (KATE [employer]ARMS STANDARD 1.4)  325 mL Oral Q24H  . fenofibrate  54 mg Oral Daily  . ferrous sulfate  325 mg Oral Q breakfast  . Gerhardt's butt cream   Topical BID  . insulin aspart  0-15 Units Subcutaneous TID WC  . insulin aspart  0-5 Units Subcutaneous QHS  . insulin aspart  3 Units Subcutaneous TID WC  . mouth rinse  15 mL Mouth Rinse q12n4p  . methocarbamol  750  mg Oral TID  . methylPREDNISolone (SOLU-MEDROL) injection  40 mg Intravenous Q12H  . multivitamin with minerals  1 tablet Oral Daily  . pantoprazole  40 mg Oral BID  . pravastatin  40 mg Oral Daily  . protein supplement  2 Scoop Oral TID WC  . sodium chloride flush  10-40 mL Intracatheter Q12H    Objective: Vital signs in last 24 hours: Temp:  [96.3 F (35.7 C)-100.9 F (38.3 C)] 97.3 F (36.3 C) (02/16 1300) Pulse Rate:  [83-134] 88 (02/16 1300) Resp:  [11-22] 14 (02/16 1300) BP: (80-95)/(40-70) 84/42 (02/16 1300) SpO2:  [93 %-98 %] 96 % (02/16 1300)   General appearance: icteric, no distress and he awakens only transiently, does not respond to voice.  Throat: abnormal findings: dry, no thrush Resp: clear to auscultation bilaterally and R chest port- no fluctuance, non-tender. no erythema.  Cardio: regular rate and rhythm GI: normal findings: bowel sounds normal and soft, non-tender Extremities: edema 2-3+ BLE  Lab Results Recent Labs    12/31/19 0500 12/31/19 0500 12/31/19 1630 01/01/20 0500  WBC 1.9*   < > 2.1* 2.2*  HGB 9.3*   < > 9.1* 8.8*  HCT 31.7*   < > 30.9* 29.6*  NA 152*  --   --  152*  K 4.1  --   --  4.3  CL 124*  --   --  125*  CO2 19*  --   --  18*  BUN 39*  --   --  44*  CREATININE 0.56*  --   --  0.73   < > = values in this interval not displayed.   Liver Panel Recent Labs    12/31/19 0500 01/01/20 0500  PROT 4.3* 4.3*  ALBUMIN 2.0* 1.9*  AST 81* 104*  ALT 107* 105*  ALKPHOS 931* 949*  BILITOT 7.7* 8.5*   Sedimentation Rate No results for input(s): ESRSEDRATE in the last 72 hours. C-Reactive Protein No results for input(s): CRP in the last 72 hours.  Microbiology: Recent Results (from the past 240 hour(s))  MRSA PCR Screening     Status: None   Collection Time: 12/25/19  4:37 PM   Specimen: Nasal Mucosa; Nasopharyngeal  Result Value Ref Range Status   MRSA by PCR NEGATIVE NEGATIVE Final    Comment:        The GeneXpert MRSA Assay  (FDA approved for NASAL specimens only), is one component of a comprehensive MRSA colonization surveillance program. It is not intended to diagnose MRSA infection nor to guide or monitor treatment for MRSA infections. Performed at [facility], [place] [address], [place], [place] [address]   Culture, blood (Routine X 2) w Reflex to ID Panel     Status: Abnormal   Collection Time: 12/25/19  7:25 PM   Specimen: BLOOD RIGHT ARM  Result Value Ref Range Status   Specimen Description   Final    BLOOD RIGHT ARM Performed at [facility], [place] [address], [place], [place] [address]    Special Requests   Final    BOTTLES DRAWN AEROBIC AND ANAEROBIC Blood Culture adequate volume Performed at [place] [address], [place], [place] [address]    Culture  Setup Time   Final    GRAM NEGATIVE RODS IN BOTH AEROBIC AND ANAEROBIC BOTTLES CRITICAL RESULT CALLED TO, READ BACK BY AND VERIFIED WITH: Seleta Rhymes PharmD 10:55 12/26/19 ([email or web address])    Culture (A)  Final    ENTEROBACTER CLOACAE VIRIDANS STREPTOCOCCUS ORGANISM 2 THE SIGNIFICANCE OF ISOLATING THIS ORGANISM FROM A SINGLE SET OF BLOOD CULTURES WHEN MULTIPLE SETS ARE DRAWN IS UNCERTAIN. PLEASE NOTIFY THE MICROBIOLOGY DEPARTMENT WITHIN ONE WEEK IF SPECIATION AND SENSITIVITIES ARE REQUIRED. Performed at [facility], [place] [address], [place], [place] [address]    Report Status 12/29/2019 FINAL  Final   Organism ID, Bacteria ENTEROBACTER CLOACAE  Final      Susceptibility   Enterobacter cloacae - MIC*    CEFAZOLIN >=64 RESISTANT Resistant     CEFEPIME <=0.12 SENSITIVE Sensitive     CEFTAZIDIME <=1 SENSITIVE Sensitive     CIPROFLOXACIN <=0.25 SENSITIVE Sensitive     GENTAMICIN <=1 SENSITIVE Sensitive     IMIPENEM 0.5 SENSITIVE Sensitive     TRIMETH/SULFA <=20 SENSITIVE Sensitive     PIP/TAZO <=4 SENSITIVE Sensitive     * ENTEROBACTER CLOACAE  Culture, blood (Routine X 2) w Reflex to ID Panel      Status: Abnormal   Collection Time: 12/25/19  7:25 PM   Specimen: BLOOD RIGHT ARM  Result Value Ref Range Status   Specimen Description   Final    BLOOD RIGHT ARM Performed at [facility], [place] [address], [place],  [address]    Special  Requests   Final    BOTTLES DRAWN AEROBIC ONLY Blood Culture adequate volume Performed at [place] [address] [place], [place] [address]    Culture  Setup Time   Final    GRAM NEGATIVE RODS AEROBIC BOTTLE ONLY CRITICAL VALUE NOTED.  VALUE IS CONSISTENT WITH PREVIOUSLY REPORTED AND CALLED VALUE.    Culture (A)  Final    ENTEROBACTER CLOACAE SUSCEPTIBILITIES PERFORMED ON PREVIOUS CULTURE WITHIN THE LAST 5 DAYS. Performed at [facility], [place] [address] [place] [address] [place], [place] [address]    Report Status 12/28/2019 FINAL  Final  Blood Culture ID Panel (Reflexed)     Status: Abnormal   Collection Time: 12/25/19  7:25 PM  Result Value Ref Range Status   Enterococcus species NOT DETECTED NOT DETECTED Final   Listeria monocytogenes NOT DETECTED NOT DETECTED Final   Staphylococcus species NOT DETECTED NOT DETECTED Final   Staphylococcus aureus (BCID) NOT DETECTED NOT DETECTED Final   Streptococcus species NOT DETECTED NOT DETECTED Final   Streptococcus agalactiae NOT DETECTED NOT DETECTED Final   Streptococcus pneumoniae NOT DETECTED NOT DETECTED Final   Streptococcus pyogenes NOT DETECTED NOT DETECTED Final   Acinetobacter baumannii NOT DETECTED NOT DETECTED Final   Enterobacteriaceae species DETECTED (A) NOT DETECTED Final    Comment: Enterobacteriaceae represent a large family of gram-negative bacteria, not a single organism. CRITICAL RESULT CALLED TO, READ BACK BY AND VERIFIED WITH: Seleta Rhymes PharmD 10:55 12/26/19 ([email or web address])    Enterobacter cloacae complex DETECTED (A) NOT DETECTED Final    Comment: CRITICAL RESULT CALLED TO, READ BACK BY AND VERIFIED WITH: Seleta Rhymes PharmD 10:55 12/26/19 ([email or web address])     Escherichia coli NOT DETECTED NOT DETECTED Final   Klebsiella oxytoca NOT DETECTED NOT DETECTED Final   Klebsiella pneumoniae NOT DETECTED NOT DETECTED Final   Proteus species NOT DETECTED NOT DETECTED Final   Serratia marcescens NOT DETECTED NOT DETECTED Final   Carbapenem resistance NOT DETECTED NOT DETECTED Final   Haemophilus influenzae NOT DETECTED NOT DETECTED Final   Neisseria meningitidis NOT DETECTED NOT DETECTED Final   Pseudomonas aeruginosa NOT DETECTED NOT DETECTED Final   Candida albicans NOT DETECTED NOT DETECTED Final   Candida glabrata NOT DETECTED NOT DETECTED Final   Candida krusei NOT DETECTED NOT DETECTED Final   Candida parapsilosis NOT DETECTED NOT DETECTED Final   Candida tropicalis NOT DETECTED NOT DETECTED Final    Comment: Performed at [facility], [place] [address] [place], [place] [address]  Body fluid culture (includes gram stain)     Status: None   Collection Time: 12/27/19  1:54 PM   Specimen: Pleural Fluid  Result Value Ref Range Status   Specimen Description   Final    PLEURAL Performed at [place] [address], [place], [place] [address]    Special Requests   Final    Immunocompromised Performed at [facility], [place] [address], [place], [place] [address]    Gram Stain   Final    RARE WBC PRESENT, PREDOMINANTLY MONONUCLEAR NO ORGANISMS SEEN    Culture   Final    NO GROWTH 3 DAYS Performed at [facility], [place] [address], [place], [place] [address]    Report Status 12/31/2019 FINAL  Final  Body fluid culture (includes gram stain)     Status: None   Collection Time: 12/28/19 10:48 AM   Specimen: Pleural Fluid  Result Value Ref Range Status   Specimen Description  Final    PLEURAL Performed at [facility], [place] [address], [place], [place] [address]    Special Requests   Final    Immunocompromised Performed at [facility], [place] [address]  [place], [place] [address]    Gram Stain   Final    FEW WBC PRESENT, PREDOMINANTLY MONONUCLEAR NO ORGANISMS SEEN    Culture   Final    NO GROWTH 3 DAYS Performed at [place] [address], [place], [place] [address]    Report Status 12/31/2019 FINAL  Final  Culture, blood (Routine X 2) w Reflex to ID Panel     Status: None (Preliminary result)   Collection Time: 12/29/19  4:35 PM   Specimen: BLOOD  Result Value Ref Range Status   Specimen Description   Final    BLOOD LEFT WRIST Performed at [place] [address], [place], [place] [address]    Special Requests   Final    BOTTLES DRAWN AEROBIC ONLY Blood Culture adequate volume Performed at [place] [address], [place], [place] [address]    Culture   Final    NO GROWTH 3 DAYS Performed at [facility], [place] [address] [place], [place] [address]    Report Status PENDING  Incomplete  Culture, blood (Routine X 2) w Reflex to ID Panel     Status: None (Preliminary result)   Collection Time: 12/29/19  4:40 PM   Specimen: BLOOD LEFT HAND  Result Value Ref Range Status   Specimen Description   Final    BLOOD LEFT HAND Performed at [place] [address], [place], [place] [address]    Special Requests   Final    BOTTLES DRAWN AEROBIC ONLY Blood Culture adequate volume Performed at [place] [address] [place], [place] [address]    Culture   Final    NO GROWTH 3 DAYS Performed at [facility], [place] [address], [place], [place] [address]    Report Status PENDING  Incomplete    Studies/Results: DG CHEST PORT 1 VIEW  Result Date: 01/01/2020 CLINICAL DATA:  Shortness of breath EXAM: PORTABLE CHEST 1 VIEW COMPARISON:  12/29/2019 FINDINGS: Cardiac shadow is stable. Right-sided chest wall port is again seen and stable. Small pleural effusions are again noted bilaterally. No bony abnormality is noted. IMPRESSION:  Stable bilateral pleural effusions. Electronically Signed   By: Inez Catalina M.D.   On: 01/01/2020 08:58   US Abdomen Limited RUQ  Result Date: 01/01/2020 CLINICAL DATA:  Increased LFTs, liver metastatic disease EXAM: ULTRASOUND ABDOMEN LIMITED RIGHT UPPER QUADRANT COMPARISON:  PET-CT from 12/04/19 FINDINGS: Gallbladder: Gallbladder is decompressed with gallstones within. Sludge is noted as well. These changes are similar to that seen on prior exam. Common bile duct: Diameter: 3 mm Liver: Multiple up attic metastatic lesions are seen. Mild ascites is noted as well as small right-sided pleural effusion. These changes are similar to that seen on prior PET-CT. Portal vein is patent on color Doppler imaging with normal direction of blood flow towards the liver. Other: None. IMPRESSION: Liver mets.  No biliary ductal dilatation is noted. Contracted gallbladder with gallstones and sludge within. Mild ascites and right pleural effusion. Electronically Signed   By: Inez Catalina M.D.   On: 01/01/2020 09:01     Assessment/Plan: SCCa of unclear origin             CTX  12-17-19 (Carbo/Paclitaxel)              Next dose tentatively scheduled for 01-07-20 Pancytopenia Streptococcal bacteremia 1/4 (2-9) Enterobacter bacteremia 3/4 (2-9) Thoracentesis R 2-11,  L 2-12             Cx pending.  Repeat BCx 2-13 pending  Total days of antibiotics: 7 cefepime, D 0 vanco/flagyl  Vanco/flagyl added due to low temp, high temp, continued hypotension.  His liver u/s reportedly showed significant metastatic disease.  His cytopenias and LFTs are grossly stable.  Repeat BCx 2-13 pending UCx sent today.  Appreciate palliative care.  Suspect his outlook is poor  Bobby Rumpf MD, FACP Infectious Diseases (pager) [phone] [email or web address] 01/01/2020, 1:36 PM  LOS: 13 days

## 2020-01-01 NOTE — Progress Notes (Signed)
PROGRESS NOTE    Kenneth Khan.  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  Patient presented from his oncologist office secondary to severe functional debility.  Hospital course On 2/9, patient was transferred to stepdown unit for hypotension, SIRS.  CCM was consulted.  Patient was also found to have bilateral pleural effusions, status post thoracentesis, right on 2/11, left on 2/12. 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  Liver biopsy showed metastatic squamous cell carcinoma.  PET/CT 11/2019 showed mass in pancreas and liver -recently started chemotherapy and radiation therapy per Oncology -Chemotherapy with carboplatin/paclitaxel started on 2/1. -Unfortunately has severe leukopenia, thrombocytopenia, likely chemotherapy induced cytopenias.  Per oncology, transfuse for platelets less than 10k -Pt given 1 pack of platelets on 2/15 for plt count of <6k with post-transfusion plts count of 28k -Plts today of 21k -Repeat CBC in AM  Severe generalized debility -In the setting of  metastatic cancer, spinal lesions, poor functional status.  -PT evaluation recommended CIR. Given continued state, deemed not appropriate for CIR at this time, although may hopefully eventually become more appropriate -Will re-order PT eval if clinical condition improves  Streptococcus viridans bacteremia with sepsis -Transfer to SDU on 2/9 for hypotension, sepsis -Patient was placed on albumin 5% for resuscitation, empiric broad-spectrum antibiotics with vancomycin and cefepime, stress dose steroids -Blood cultures 2/9 growing Streptococcus viridans -Continued IV cefepime, day 7, 2D echo with no obvious vegetations -ID following -Overnight, pt hypothermic and later febrile in AM with hypotension, given IVF boluses overnight -Broadened abx coverage by adding vancomycin and flagyl -UA and urine cx ordered, pending -Repeat CXR ordered and reviewed, notable for stable B pleural effusions -Ordered and reviewed RUQ [place], findings of liver mets with contracted GB and gallstones with sludge. Mild ascites and R pleural effusion  Abdominal distention/ileus - Abdominal x-ray on 2/9 showed few air-filled distended small bowel loops in the mid abdomen, question ileus.   -Resolved, Will cont with diet as tolerated  Mild respiratory distress, large b/l pleural effusions, anasarca -Respiratory improved following bilateral thoracentesis -Chest x-ray 2/10 shows large right pleural effusion and moderate to large left pleural effusion with associated passive atelectasis and/or pneumonia involving the lower lobes -Status post right thoracentesis on 2/11, 1.8 L out, status post left thoracentesis on 2/12 800 cc removed. Cytology pending -Currently on room air. Repeat CXR per above  Cytopenias with leukopenia, thrombocytopenia -Noted to have severe cytopenia secondary to chemotherapy. - received platelet pheresis on 2/11, 2/13 - s/p 1 pack of platelet tx on 2/15 -Repeat CBC in AM  Transaminitis,  elevated alkaline phosphatase -Likely secondary to metastatic disease -CT from 12/20 reviewed. Findings of innumerable low-density  lesions throughout the liver compatible with metastatic disease - Bili now up to 8 -Ordered and reviewed RUQ [place] per above -repeat LFT's in AM  Iron deficiency anemia -Likely secondary to blood loss from active malignancy -Continue with iron supplementation as tolerated  History of essential hypertension -BP remains soft overnigt -currently remains off antihypertensives  Pressure injury: POA Buttocks right, left, mid stage II Bilateral heels, stage I  -continue wound care per nursing  DVT prophylaxis: SCD's Code Status: Full Family Communication: Pt in room, family not at bedside Disposition Plan: From home, plan d/c SNF vs re-eval for CIR when hemodynamic more stable and respiratory status improves  Consultants:   Oncology  PCCM  CIR  Palliative Care  ID  Procedures:  Right-sided thoracentesis 2/11 Left-sided thoracentesis 2/12  Antimicrobials: Anti-infectives (From admission, onward)   Start     Dose/Rate Route Frequency Ordered Stop   01/01/20 0900  vancomycin (VANCOREADY) IVPB 1500 mg/300 mL     1,500 mg 150 mL/hr over 120 Minutes Intravenous Every 12 hours 01/01/20 0759     01/01/20 0800  metroNIDAZOLE (FLAGYL) IVPB 500 mg     500 mg 100 mL/hr over 60 Minutes Intravenous Every 8 hours 01/01/20 0736     12/26/19 0800  vancomycin (VANCOREADY) IVPB 1500 mg/300 mL  Status:  Discontinued     1,500 mg 150 mL/hr over 120 Minutes Intravenous Every 12 hours 12/25/19 1902 12/26/19 1105   12/25/19 1900  ceFEPIme (MAXIPIME) 2 g in sodium chloride 0.9 % 100 mL IVPB     2 g 200 mL/hr over 30 Minutes Intravenous Every 8 hours 12/25/19 1844     12/25/19 1900  vancomycin (VANCOREADY) IVPB 1750 mg/350 mL     1,750 mg 175 mL/hr over 120 Minutes Intravenous  Once 12/25/19 1845 12/25/19 2347      Subjective: Awake but does not answer  questions  Objective: Vitals:   01/01/20 1000 01/01/20 1100 01/01/20 1200 01/01/20 1300  BP: (!) 92/59 (!) 82/56 (!) 95/49 (!) 84/42  Pulse: (!) 109 96 83 88  Resp: 15 (!) 22 14 14   Temp: 98.6 F (37 C) 98.4 F (36.9 C) 97.9 F (36.6 C) (!) 97.3 F (36.3 C)  TempSrc: Bladder  Bladder   SpO2: 96% 98% 96% 96%  Weight:      Height:        Intake/Output Summary (Last 24 hours) at 01/01/2020 1316 Last data filed at 01/01/2020 1300 Gross per 24 hour  Intake 1798.83 ml  Output 1075 ml  Net 723.83 ml   Filed Weights   12/25/19 1800  Weight: 81.5 kg    Examination: General exam: Awake, laying in bed, in nad Respiratory system: Normal respiratory effort, no wheezing Cardiovascular system: regular rate, s1, s2 Gastrointestinal system: Soft, nondistended, positive BS Central nervous system: CN2-12 grossly intact, strength intact Extremities: Perfused, no clubbing Skin: Normal skin turgor, no notable skin lesions seen Psychiatry: unable to assess given mentation  Data Reviewed: I have personally reviewed following labs and imaging studies  CBC: Recent Labs  Lab 12/29/19 1311 12/30/19 0500 12/31/19 0500 12/31/19 1630 01/01/20 0500  WBC 2.0* 2.4* 1.9* 2.1* 2.2*  NEUTROABS 1.7  --   --   --   --   HGB 9.0* 9.4* 9.3* 9.1* 8.8*  HCT 29.1* 31.8* 31.7* 30.9* 29.6*  MCV 73.7* 75.2* 75.8* 76.7* 76.5*  PLT 18* 9* 6* 28* 21*   Basic Metabolic Panel: Recent Labs  Lab 12/27/19 0544 12/27/19 1038 12/28/19 0358 12/29/19  0500 12/30/19 0500 12/31/19 0500 01/01/20 0500  NA   < >  --  142 143 147* 152* 152*  K   < >  --  3.7 3.6 3.8 4.1 4.3  CL   < >  --  113* 115* 120* 124* 125*  CO2   < >  --  17* 18* 20* 19* 18*  GLUCOSE   < >  --  228* 250* 216* 200* 205*  BUN   < >  --  38* 37* 36* 39* 44*  CREATININE   < >  --  0.74 0.69 0.61 0.56* 0.73  CALCIUM   < >  --  7.3* 7.3* 7.5* 7.7* 7.4*  MG  --  2.1 2.1  --   --   --   --   PHOS  --   --  3.1  --   --   --   --    < > =  values in this interval not displayed.   GFR: Estimated Creatinine Clearance: 105.6 mL/min (by C-G formula based on SCr of 0.73 mg/dL). Liver Function Tests: Recent Labs  Lab 12/28/19 0358 12/29/19 0500 12/30/19 0500 12/31/19 0500 01/01/20 0500  AST 53* 70* 90* 81* 104*  ALT 79* 83* 103* 107* 105*  ALKPHOS 701* 803* 972* 931* 949*  BILITOT 6.4* 6.8* 7.6* 7.7* 8.5*  PROT 4.2* 4.5* 4.5* 4.3* 4.3*  ALBUMIN 2.0* 2.4* 2.2* 2.0* 1.9*   Recent Labs  Lab 12/27/19 1038  LIPASE 21  AMYLASE 22*   No results for input(s): AMMONIA in the last 168 hours. Coagulation Profile: No results for input(s): INR, PROTIME in the last 168 hours. Cardiac Enzymes: No results for input(s): CKTOTAL, CKMB, CKMBINDEX, TROPONINI in the last 168 hours. BNP (last 3 results) No results for input(s): PROBNP in the last 8760 hours. HbA1C: Recent Labs    12/30/19 0605  HGBA1C 6.7*   CBG: Recent Labs  Lab 12/31/19 1209 12/31/19 1614 12/31/19 2140 01/01/20 0744 01/01/20 1154  GLUCAP 183* 190* 207* 208* 142*   Lipid Profile: No results for input(s): CHOL, HDL, LDLCALC, TRIG, CHOLHDL, LDLDIRECT in the last 72 hours. Thyroid Function Tests: No results for input(s): TSH, T4TOTAL, FREET4, T3FREE, THYROIDAB in the last 72 hours. Anemia Panel: No results for input(s): VITAMINB12, FOLATE, FERRITIN, TIBC, IRON, RETICCTPCT in the last 72 hours. Sepsis Labs: Recent Labs  Lab 12/25/19 1835 12/25/19 2119 12/26/19 0311 12/27/19 0544  PROCALCITON 2.14  --  2.76 3.75  LATICACIDVEN 1.9 1.8  --   --     Recent Results (from the past 240 hour(s))  MRSA PCR Screening     Status: None   Collection Time: 12/25/19  4:37 PM   Specimen: Nasal Mucosa; Nasopharyngeal  Result Value Ref Range Status   MRSA by PCR NEGATIVE NEGATIVE Final    Comment:        The GeneXpert MRSA Assay (FDA approved for NASAL specimens only), is one component of a comprehensive MRSA colonization surveillance program. It is  not intended to diagnose MRSA infection nor to guide or monitor treatment for MRSA infections. Performed at [facility], [place] [address], [place],  [address]   Culture, blood (Routine X 2) w Reflex to ID Panel     Status: Abnormal   Collection Time: 12/25/19  7:25 PM   Specimen: BLOOD RIGHT ARM  Result Value Ref Range Status   Specimen Description   Final    BLOOD RIGHT ARM Performed at [facility], [place] [address] [place], [place] [address]    Special Requests   Final    BOTTLES DRAWN AEROBIC AND ANAEROBIC Blood Culture adequate volume Performed at [place] [address] [place], [place] [address]    Culture  Setup Time   Final    GRAM NEGATIVE RODS IN BOTH AEROBIC AND ANAEROBIC BOTTLES CRITICAL RESULT CALLED TO, READ BACK BY AND VERIFIED WITH: Seleta Rhymes PharmD 10:55 12/26/19 ([email or web address])    Culture (A)  Final    ENTEROBACTER CLOACAE VIRIDANS STREPTOCOCCUS ORGANISM 2 THE SIGNIFICANCE OF ISOLATING THIS ORGANISM FROM A SINGLE SET OF BLOOD CULTURES WHEN MULTIPLE SETS ARE DRAWN IS UNCERTAIN. PLEASE NOTIFY THE MICROBIOLOGY DEPARTMENT WITHIN ONE WEEK IF SPECIATION AND SENSITIVITIES ARE REQUIRED. Performed at [facility], [place] [address], [place], [place] [address]    Report Status 12/29/2019 FINAL  Final   Organism ID, Bacteria ENTEROBACTER CLOACAE  Final      Susceptibility   Enterobacter cloacae - MIC*    CEFAZOLIN >=64 RESISTANT Resistant     CEFEPIME <=0.12 SENSITIVE Sensitive     CEFTAZIDIME <=1 SENSITIVE Sensitive     CIPROFLOXACIN <=0.25 SENSITIVE Sensitive     GENTAMICIN <=1 SENSITIVE Sensitive     IMIPENEM 0.5 SENSITIVE Sensitive     TRIMETH/SULFA <=20 SENSITIVE Sensitive     PIP/TAZO <=4 SENSITIVE Sensitive     * ENTEROBACTER CLOACAE  Culture, blood (Routine X 2) w Reflex to ID Panel     Status: Abnormal   Collection Time: 12/25/19  7:25 PM   Specimen: BLOOD RIGHT ARM  Result Value Ref Range Status    Specimen Description   Final    BLOOD RIGHT ARM Performed at [facility], [place] [address], [place], [place] [address]    Special Requests   Final    BOTTLES DRAWN AEROBIC ONLY Blood Culture adequate volume Performed at [place] [address], [place], [place] [address]    Culture  Setup Time   Final    GRAM NEGATIVE RODS AEROBIC BOTTLE ONLY CRITICAL VALUE NOTED.  VALUE IS CONSISTENT WITH PREVIOUSLY REPORTED AND CALLED VALUE.    Culture (A)  Final    ENTEROBACTER CLOACAE SUSCEPTIBILITIES PERFORMED ON PREVIOUS CULTURE WITHIN THE LAST 5 DAYS. Performed at [facility], [place] [address], [place], [place] [address]    Report Status 12/28/2019 FINAL  Final  Blood Culture ID Panel (Reflexed)     Status: Abnormal   Collection Time: 12/25/19  7:25 PM  Result Value Ref Range Status   Enterococcus species NOT DETECTED NOT DETECTED Final   Listeria monocytogenes NOT DETECTED NOT DETECTED Final   Staphylococcus species NOT DETECTED NOT DETECTED Final   Staphylococcus aureus (BCID) NOT DETECTED NOT DETECTED Final   Streptococcus species NOT DETECTED NOT DETECTED Final   Streptococcus agalactiae NOT DETECTED NOT DETECTED Final   Streptococcus pneumoniae NOT DETECTED NOT DETECTED Final   Streptococcus pyogenes NOT DETECTED NOT DETECTED Final   Acinetobacter baumannii NOT DETECTED NOT DETECTED Final   Enterobacteriaceae species DETECTED (A) NOT DETECTED Final    Comment: Enterobacteriaceae represent a large family of gram-negative bacteria, not a single organism. CRITICAL RESULT CALLED TO, READ BACK BY AND VERIFIED WITH: Seleta Rhymes PharmD 10:55 12/26/19 ([email or web address])    Enterobacter cloacae complex DETECTED (A) NOT DETECTED Final    Comment: CRITICAL RESULT CALLED TO, READ BACK BY AND VERIFIED WITH: Seleta Rhymes PharmD 10:55 12/26/19 ([email or web address])    Escherichia coli  NOT DETECTED NOT DETECTED Final   Klebsiella oxytoca NOT DETECTED NOT DETECTED Final   Klebsiella  pneumoniae NOT DETECTED NOT DETECTED Final   Proteus species NOT DETECTED NOT DETECTED Final   Serratia marcescens NOT DETECTED NOT DETECTED Final   Carbapenem resistance NOT DETECTED NOT DETECTED Final   Haemophilus influenzae NOT DETECTED NOT DETECTED Final   Neisseria meningitidis NOT DETECTED NOT DETECTED Final   Pseudomonas aeruginosa NOT DETECTED NOT DETECTED Final   Candida albicans NOT DETECTED NOT DETECTED Final   Candida glabrata NOT DETECTED NOT DETECTED Final   Candida krusei NOT DETECTED NOT DETECTED Final   Candida parapsilosis NOT DETECTED NOT DETECTED Final   Candida tropicalis NOT DETECTED NOT DETECTED Final    Comment: Performed at [facility], [place] [address] [place], [place] [address]  Body fluid culture (includes gram stain)     Status: None   Collection Time: 12/27/19  1:54 PM   Specimen: Pleural Fluid  Result Value Ref Range Status   Specimen Description   Final    PLEURAL Performed at [place] [address], [place], [place] [address]    Special Requests   Final    Immunocompromised Performed at [facility], [place] [address] [place], [place] [address]    Gram Stain   Final    RARE WBC PRESENT, PREDOMINANTLY MONONUCLEAR NO ORGANISMS SEEN    Culture   Final    NO GROWTH 3 DAYS Performed at [facility], [place] [address], [place], [place] [address]    Report Status 12/31/2019 FINAL  Final  Body fluid culture (includes gram stain)     Status: None   Collection Time: 12/28/19 10:48 AM   Specimen: Pleural Fluid  Result Value Ref Range Status   Specimen Description   Final    PLEURAL Performed at [place] [address], [place], [place] [address]    Special Requests   Final    Immunocompromised Performed at [facility], [place] [address], [place], [place] [address]    Gram Stain   Final    FEW WBC PRESENT, PREDOMINANTLY MONONUCLEAR NO ORGANISMS SEEN     Culture   Final    NO GROWTH 3 DAYS Performed at [place] [address] [place], [place] [address]    Report Status 12/31/2019 FINAL  Final  Culture, blood (Routine X 2) w Reflex to ID Panel     Status: None (Preliminary result)   Collection Time: 12/29/19  4:35 PM   Specimen: BLOOD  Result Value Ref Range Status   Specimen Description   Final    BLOOD LEFT WRIST Performed at [place] [address] [place], [place] [address]    Special Requests   Final    BOTTLES DRAWN AEROBIC ONLY Blood Culture adequate volume Performed at [place] [address], [place], [place] [address]    Culture   Final    NO GROWTH 3 DAYS Performed at [facility], [place] [address], [place], [place] [address]    Report Status PENDING  Incomplete  Culture, blood (Routine X 2) w Reflex to ID Panel     Status: None (Preliminary result)   Collection Time: 12/29/19  4:40 PM   Specimen: BLOOD LEFT HAND  Result Value Ref Range Status   Specimen Description   Final    BLOOD LEFT HAND Performed at [facility],  [place] [address], [place], [place] [address]    Special Requests   Final    BOTTLES DRAWN AEROBIC ONLY Blood Culture adequate volume Performed at [place] [address], [place], [place] [address]    Culture   Final    NO GROWTH 3 DAYS Performed at [facility], [place] [address] [place], [place] [address]    Report Status PENDING  Incomplete     Radiology Studies: DG CHEST PORT 1 VIEW  Result Date: 01/01/2020 CLINICAL DATA:  Shortness of breath EXAM: PORTABLE CHEST 1 VIEW COMPARISON:  12/29/2019 FINDINGS: Cardiac shadow is stable. Right-sided chest wall port is again seen and stable. Small pleural effusions are again noted bilaterally. No bony abnormality is noted. IMPRESSION: Stable bilateral pleural effusions. Electronically Signed   By: Inez Catalina M.D.   On: 01/01/2020 08:58   US  Abdomen Limited RUQ  Result Date: 01/01/2020 CLINICAL DATA:  Increased LFTs, liver metastatic disease EXAM: ULTRASOUND ABDOMEN LIMITED RIGHT UPPER QUADRANT COMPARISON:  PET-CT from 12/04/19 FINDINGS: Gallbladder: Gallbladder is decompressed with gallstones within. Sludge is noted as well. These changes are similar to that seen on prior exam. Common bile duct: Diameter: 3 mm Liver: Multiple up attic metastatic lesions are seen. Mild ascites is noted as well as small right-sided pleural effusion. These changes are similar to that seen on prior PET-CT. Portal vein is patent on color Doppler imaging with normal direction of blood flow towards the liver. Other: None. IMPRESSION: Liver mets.  No biliary ductal dilatation is noted. Contracted gallbladder with gallstones and sludge within. Mild ascites and right pleural effusion. Electronically Signed   By: Inez Catalina M.D.   On: 01/01/2020 09:01    Scheduled Meds: . chlorhexidine  15 mL Mouth Rinse BID  . Chlorhexidine Gluconate Cloth  6 each Topical Daily  . feeding supplement (KATE FARMS STANDARD 1.4)  325 mL Oral Q24H  . fenofibrate  54 mg Oral Daily  . ferrous sulfate  325 mg Oral Q breakfast  . Gerhardt's butt cream   Topical BID  . insulin aspart  0-15 Units Subcutaneous TID WC  . insulin aspart  0-5 Units Subcutaneous QHS  . insulin aspart  3 Units Subcutaneous TID WC  . mouth rinse  15 mL Mouth Rinse q12n4p  . methocarbamol  750 mg Oral TID  . methylPREDNISolone (SOLU-MEDROL) injection  40 mg Intravenous Q12H  . multivitamin with minerals  1 tablet Oral Daily  . pantoprazole  40 mg Oral BID  . pravastatin  40 mg Oral Daily  . protein supplement  2 Scoop Oral TID WC  . sodium chloride flush  10-40 mL Intracatheter Q12H   Continuous Infusions: . ceFEPime (MAXIPIME) IV Stopped (01/01/20 0542)  . metronidazole Stopped (01/01/20 0910)  . ondansetron (ZOFRAN) IV Stopped (12/27/19 0916)  . vancomycin 150 mL/hr at 01/01/20 1300     LOS: 13  days   Marylu Lund, MD [facility] Pager On Amion  If 7PM-7AM, please contact night-coverage 01/01/2020, 1:16 PM

## 2020-01-01 NOTE — Progress Notes (Signed)
OT Cancellation Note  Patient Details Name: Dru Oppegard. MRN: [id number] DOB: [birth year]   Cancelled Treatment:    Reason Eval/Treat Not Completed: Medical issues which prohibited therapy. Will sign off at this time 2* multiple medical issues. Please reorder, if pt can benefit from [place] later.    Adriella Essex 01/01/2020, 9:02 AM  Karsten Ro, OTR/L Acute Rehabilitation Services 01/01/2020

## 2020-01-01 NOTE — Progress Notes (Signed)
Physical Therapy Discharge Patient Details Name: Burak Custodio. MRN: [id number] DOB: 1966/04/21 Today's Date: 01/01/2020 Time:  -     Patient discharged from PT services secondary to medical decline - will need to re-order PT to resume therapy services.  Please see latest therapy progress note for current level of functioning and progress toward goals.        Claretha Cooper 01/01/2020, 9:21 AM [place] Pager [phone] Office [phone]

## 2020-01-01 NOTE — Progress Notes (Signed)
Paged on call Hospitalist about soft blood pressure systolic in [phone]. Digoxin ordered and given. Also paged about sustained HR 130's, 500 ml bolus ordered and given. Pt not interactive, responds to voice also told Hospitalist this. RN will continue to monitor.

## 2020-01-01 NOTE — Progress Notes (Signed)
Initial Nutrition Assessment  DOCUMENTATION CODES:   Not applicable  INTERVENTION:  - will order 2 scoops beneprotein TID, each scoop provides 25 kcal and 6 grams protein. - will order Dillard Essex 1.4 once/day, each supplement provide 455 kcal and 20 grams protein. - will order Magic Cup BID as a snack, each supplement provides 290 kcal and 9 grams of protein. - will order daily multivitamin with minerals. - will liberalize diet from Carb Modified to Regular.  *dependent on GOC and ongoing medical course, may need to consider small bore NGT vs. PEG placement and initiation of TF to meet nutrition needs.    NUTRITION DIAGNOSIS:   Increased nutrient needs related to acute illness, chronic illness, cancer and cancer related treatments as evidenced by estimated needs.  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 Patient presented to the hospital from [place] office on 2/3 due to severe functional debility. On 2/9 he was transferred to SDU for hypotension, SIRS. Patient was also found to have bilateral pleural effusions; s/p R thoracentesis on 2/11 (1800 ml) and L on 2/12 (800 ml).  Per flow sheet documentation, patient consumed 50% of breakfast and dinner order on 2/12 and has been consuming no meals since that time.   Patient is currently awake but unable to communicate. His fiance was at the bedside, feeding him a cup of applesauce, and provides all information and input.   Patient's appetite was slowly decreasing PTA, but in the past 2 weeks has significantly declined. His last full, good meal was on the day of admission when he ate chicken and several side dishes. Since that time he has declined to only accepting  items that require minimal to no chewing (soup, smoothies, applesauce, jello).  Talked with fiance about supplements outlined above and she is very open to trying these and is very interested in trying to increase his kcal and protein intake to aid in healing and maintenance/regaining of strength.  Most of the time in the room was spent talking about these topics and providing active listening as fiance expresses the challenges that the [place] pandemic has added to having limited bedside support from family and friends who have been a wonderful emotional support system to her and the patient.  Per chart review, weight on 2/9 was 180 lb and weight on 11/03/19 was 184 lb. This indicates 4 lb weight loss (2.2% body weight) in the past 2 months; not significant for time frame.  Palliative Care is now following and last met with patient and his fiance yesterday. Will continue to monitor for plans concerning GOC.    Labs reviewed; CBG: 208 mg/dl, Na: 152 mmol/l, Cl: 125 mmol/l, BUN: 44 mg/dl, Ca: 7.4 mg/dl, Alk Phos and LFTs elevated. Medications reviewed; 325 mg ferrous sulfate/day, sliding scale novolog, 3 units novolog TID, 40 mg solu-medrol BID, 40 mg oral protonix BID.      NUTRITION - FOCUSED PHYSICAL EXAM:  unable to complete at this time.   Diet Order:   Diet Order            Diet Carb Modified Fluid consistency: Thin; Room service appropriate? Yes  Diet effective now  EDUCATION NEEDS:   No education needs have been identified at this time  Skin:  Skin Assessment: Skin Integrity Issues: Skin Integrity Issues:: Stage I, Stage II Stage I: bilateral heels Stage II: bilateral buttocks  Last BM:  2/15  Height:   Ht Readings from Last 1 Encounters:  12/26/19 5' 9"  (1.753 m)    Weight:   Wt Readings from Last 1 Encounters:  12/25/19 81.5 kg    Ideal Body Weight:  72.7 kg  BMI:  Body mass index is 26.53 kg/m.  Estimated Nutritional Needs:   Kcal:   2300-2550 kcal  Protein:  115-130 grams  Fluid:  >/= 2.3 L/day     Jarome Matin, MS, RD, LDN, CNSC Inpatient Clinical Dietitian RD pager # available in [facility]  After hours/weekend pager # available in [facility]

## 2020-01-01 NOTE — Progress Notes (Signed)
Pharmacy Antibiotic Note  Kenneth Khan. is a 54 y.o. male admit 2/3 from [facility] with hx pancreatic mass, SqCellCa of liver & bone. Hx gallstones Noted hypotension, tachycardia, ambulatory dysfx (concern for continuing XRT - planned end date 2/12). Hx T12 comp fx C1 Carbo/Taxol 2/1, did not receive Filgrastim scheduled for 2/4  01/01/2020  D7 full abx. Cefepime for complex bacteremia with Enterobacter cloacae + Strep viridans Resume Vancomycin  WBC 2.2, SCr 0.73, Febrile again max 100.8 S/p R thoracentesis 2/11> 1.8 L, 2/12 L thoracentesis > 872ml, consider PleurX cath if recurrent effusion  Plan:  Continue Cefepime 2gm IV q8h  Resume Vancomycin 1500mg  q12, AUC 509, SCr 0.73  IV PPI> Po  Height: 5\' 9"  (175.3 cm) Weight: 179 lb 10.8 oz (81.5 kg) IBW/kg (Calculated) : 70.7  Temp (24hrs), Avg:97.9 F (36.6 C), Min:96.3 F (35.7 C), Max:100.9 F (38.3 C)  Recent Labs  Lab 12/25/19 1835 12/25/19 2119 12/26/19 0311 12/28/19 0358 12/28/19 0358 12/29/19 0500 12/29/19 0500 12/29/19 1311 12/30/19 0500 12/31/19 0500 12/31/19 1630 01/01/20 0500  WBC  --   --    < > 1.8*   < > 1.7*   < > 2.0* 2.4* 1.9* 2.1* 2.2*  CREATININE  --   --    < > 0.74  --  0.69  --   --  0.61 0.56*  --  0.73  LATICACIDVEN 1.9 1.8  --   --   --   --   --   --   --   --   --   --    < > = values in this interval not displayed.    Estimated Creatinine Clearance: 105.6 mL/min (by C-G formula based on SCr of 0.73 mg/dL).    No Known Allergies   Antimicrobials this admission:  2/9 vanc >>  2/10, 2/16 >> 2/9 cefepime >>   Dose adjustments this admission:   Microbiology results:  2/9 BCx x2:  3 bottles with GNR (BCID= enterobacter cloacae complex) R ancef, sens to all other tested + Strep viridans - no sens 2/9 MRSA PCR: negative 2/12 pleural fluid: ngtd 2/11 pleural fluid: ngtd 2/13 BCx2: ngtd 2/16 ordered U/A, UCx: need collection  Thank you for allowing pharmacy to be a part of this patient's  care.  Minda Ditto PharmD 01/01/2020, 7:53 AM

## 2020-01-02 ENCOUNTER — Ambulatory Visit: Payer: Managed Care, Other (non HMO)

## 2020-01-02 DIAGNOSIS — R531 Weakness: Secondary | ICD-10-CM

## 2020-01-02 DIAGNOSIS — Z515 Encounter for palliative care: Secondary | ICD-10-CM

## 2020-01-02 DIAGNOSIS — Z7189 Other specified counseling: Secondary | ICD-10-CM

## 2020-01-02 LAB — CBC WITH DIFFERENTIAL/PLATELET
Abs Immature Granulocytes: 0.03 10*3/uL (ref 0.00–0.07)
Basophils Absolute: 0 10*3/uL (ref 0.0–0.1)
Basophils Relative: 0 %
Eosinophils Absolute: 0 10*3/uL (ref 0.0–0.5)
Eosinophils Relative: 0 %
HCT: 27.4 % — ABNORMAL LOW (ref 39.0–52.0)
Hemoglobin: 8 g/dL — ABNORMAL LOW (ref 13.0–17.0)
Immature Granulocytes: 1 %
Lymphocytes Relative: 3 %
Lymphs Abs: 0.1 10*3/uL — ABNORMAL LOW (ref 0.7–4.0)
MCH: 23.2 pg — ABNORMAL LOW (ref 26.0–34.0)
MCHC: 29.2 g/dL — ABNORMAL LOW (ref 30.0–36.0)
MCV: 79.4 fL — ABNORMAL LOW (ref 80.0–100.0)
Monocytes Absolute: 0.2 10*3/uL (ref 0.1–1.0)
Monocytes Relative: 10 %
Neutro Abs: 2 10*3/uL (ref 1.7–7.7)
Neutrophils Relative %: 86 %
Platelets: 10 10*3/uL — CL (ref 150–400)
RBC: 3.45 MIL/uL — ABNORMAL LOW (ref 4.22–5.81)
RDW: 25.8 % — ABNORMAL HIGH (ref 11.5–15.5)
WBC: 2.3 10*3/uL — ABNORMAL LOW (ref 4.0–10.5)
nRBC: 1.3 % — ABNORMAL HIGH (ref 0.0–0.2)

## 2020-01-02 LAB — COMPREHENSIVE METABOLIC PANEL
ALT: 100 U/L — ABNORMAL HIGH (ref 0–44)
AST: 98 U/L — ABNORMAL HIGH (ref 15–41)
Albumin: 2 g/dL — ABNORMAL LOW (ref 3.5–5.0)
Alkaline Phosphatase: 842 U/L — ABNORMAL HIGH (ref 38–126)
Anion gap: 9 (ref 5–15)
BUN: 47 mg/dL — ABNORMAL HIGH (ref 6–20)
CO2: 17 mmol/L — ABNORMAL LOW (ref 22–32)
Calcium: 7.6 mg/dL — ABNORMAL LOW (ref 8.9–10.3)
Chloride: 127 mmol/L — ABNORMAL HIGH (ref 98–111)
Creatinine, Ser: 0.82 mg/dL (ref 0.61–1.24)
GFR calc Af Amer: >60 mL/min (ref >60)
GFR calc non Af Amer: >60 mL/min (ref >60)
Glucose, Bld: 167 mg/dL — ABNORMAL HIGH (ref 70–99)
Potassium: 3.9 mmol/L (ref 3.5–5.1)
Sodium: 153 mmol/L — ABNORMAL HIGH (ref 135–145)
Total Bilirubin: 8 mg/dL — ABNORMAL HIGH (ref 0.3–1.2)
Total Protein: 4.2 g/dL — ABNORMAL LOW (ref 6.5–8.1)

## 2020-01-02 LAB — CBC
HCT: 27.7 % — ABNORMAL LOW (ref 39.0–52.0)
Hemoglobin: 8.1 g/dL — ABNORMAL LOW (ref 13.0–17.0)
MCH: 22.7 pg — ABNORMAL LOW (ref 26.0–34.0)
MCHC: 29.2 g/dL — ABNORMAL LOW (ref 30.0–36.0)
MCV: 77.6 fL — ABNORMAL LOW (ref 80.0–100.0)
Platelets: 10 10*3/uL — CL (ref 150–400)
RBC: 3.57 MIL/uL — ABNORMAL LOW (ref 4.22–5.81)
RDW: 25.4 % — ABNORMAL HIGH (ref 11.5–15.5)
WBC: 2 10*3/uL — ABNORMAL LOW (ref 4.0–10.5)
nRBC: 1 % — ABNORMAL HIGH (ref 0.0–0.2)

## 2020-01-02 LAB — URINE CULTURE: Culture: NO GROWTH

## 2020-01-02 LAB — CHOLESTEROL, BODY FLUID: Cholesterol, Fluid: 44 mg/dL

## 2020-01-02 LAB — GLUCOSE, CAPILLARY
Glucose-Capillary: 144 mg/dL — ABNORMAL HIGH (ref 70–99)
Glucose-Capillary: 173 mg/dL — ABNORMAL HIGH (ref 70–99)

## 2020-01-02 MED ORDER — ONDANSETRON 4 MG PO TBDP: 4.0000 mg | ORAL_TABLET | Freq: Four times a day (QID) | ORAL | Status: DC | PRN

## 2020-01-02 MED ORDER — BIOTENE DRY MOUTH MT LIQD: 15.0000 mL | OROMUCOSAL | Status: DC | PRN

## 2020-01-02 MED ORDER — ACETAMINOPHEN 325 MG PO TABS: 650.0000 mg | ORAL_TABLET | Freq: Four times a day (QID) | ORAL | Status: DC | PRN

## 2020-01-02 MED ORDER — POLYVINYL ALCOHOL 1.4 % OP SOLN
1.0000 [drp] | Freq: Four times a day (QID) | OPHTHALMIC | Status: DC | PRN
  Filled 2020-01-02: qty 15

## 2020-01-02 MED ORDER — SODIUM CHLORIDE 0.9% IV SOLUTION: Freq: Once | INTRAVENOUS | Status: DC

## 2020-01-02 MED ORDER — GLYCOPYRROLATE 1 MG PO TABS: 1.0000 mg | ORAL_TABLET | ORAL | Status: DC | PRN

## 2020-01-02 MED ORDER — MORPHINE 100MG IN NS 100ML (1MG/ML) PREMIX INFUSION
1.0000 mg/h | INTRAVENOUS | Status: DC
  Administered 2020-01-02: 1 mg/h via INTRAVENOUS
  Administered 2020-01-03: 5 mg/h via INTRAVENOUS
  Filled 2020-01-02 (×3): qty 100

## 2020-01-02 MED ORDER — ACETAMINOPHEN 650 MG RE SUPP: 650.0000 mg | Freq: Four times a day (QID) | RECTAL | Status: DC | PRN

## 2020-01-02 MED ORDER — GLYCOPYRROLATE 0.2 MG/ML IJ SOLN: 0.2000 mg | INTRAMUSCULAR | Status: DC | PRN

## 2020-01-02 MED ORDER — ONDANSETRON HCL 4 MG/2ML IJ SOLN: 4.0000 mg | Freq: Four times a day (QID) | INTRAMUSCULAR | Status: DC | PRN

## 2020-01-02 MED ORDER — HYDROMORPHONE HCL 1 MG/ML IJ SOLN
0.5000 mg | INTRAMUSCULAR | Status: DC | PRN
  Administered 2020-01-02: 0.5 mg via INTRAVENOUS
  Filled 2020-01-02: qty 1

## 2020-01-02 NOTE — Progress Notes (Signed)
   01/02/20 0900  Clinical Encounter Type  Visited With Health care provider  Visit Type Initial;Psychological support;Spiritual support;Critical Care  Referral From Nurse  Consult/Referral To Chaplain  Spiritual Encounters  Spiritual Needs Emotional;Other (Comment) (Advance Directive )  Stress Factors  Patient Stress Factors Not reviewed   I spoke with the patient's nurse per spiritual care consult for an advance directive.  Kenneth Khan is not able to communicate with me, therefore, I am unable to complete an Advance Directive at this time.  I will call the [name] and explain this to her personally and provide support as needed.   Please, contact Spiritual Care for further assistance.   Chaplain Shanon Ace M.Div., [facility]

## 2020-01-02 NOTE — Progress Notes (Signed)
   01/02/20 1300  Clinical Encounter Type  Visited With Family (Fiance and mother )  Visit Type Follow-up;Psychological support;Spiritual support  Referral From Nurse  Consult/Referral To Chaplain  Spiritual Encounters  Spiritual Needs Emotional;Other (Comment) (Spiritual Care Conversation around decision making )  Stress Factors  Patient Stress Factors Not reviewed  Family Stress Factors Health changes;Major life changes    I have spoken with Palliative Care, the bedside nurse, the patient's fiance and his mother.  I was not able to complete an Advance Diretive naming his fiance, [name], as his healthcare agent. But, after speaking with Mr. Arensdorf' mother, who is his legal next-of-kin, she has given [place] permission to make [name], the fiance, Mr. Ricco' medical decision maker.    Please, contact Spiritual Care for further assistance.   Chaplain Shanon Ace M.Div., [facility]

## 2020-01-02 NOTE — Progress Notes (Signed)
CRITICAL VALUE ALERT  Critical Value:  Platelets 10  Date & Time Notied: 01/02/2020 0600  Provider Notified: M. Sharlet [place], [place]     Orders Received/Actions taken: See new orders.

## 2020-01-02 NOTE — Progress Notes (Addendum)
Patient seen again this afternoon. Now very restless, with facial grimacing and  meaningless arm movements. [name] is at bedside. I have taken her into the family room to provide updates. She is appropriately tearful and mentions several times how quickly he has declined since his diagnosis in late December and even over the weekend. We have discussed DNR and progressing to comfort care. She feels like this would be appropriate but would like to confer with patient's mother. They will reach out to [place] once they are ready to make further decisions.  Kenneth Mend, MD [phone]   Addendum: Family has elected to pursue comfort care and initiate morphine drip. Will continue to provide support as needed.  Kenneth Mend, MD [phone]

## 2020-01-02 NOTE — Progress Notes (Signed)
INFECTIOUS DISEASE PROGRESS NOTE  ID: Kenneth Khan. is a 54 y.o. 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   Abtx:  Anti-infectives (From admission, onward)   Start     Dose/Rate Route Frequency Ordered Stop   01/01/20 0900  vancomycin (VANCOREADY) IVPB 1500 mg/300 mL     1,500 mg 150 mL/hr over 120 Minutes Intravenous Every 12 hours 01/01/20 0759     01/01/20 0800  metroNIDAZOLE (FLAGYL) IVPB 500 mg     500 mg 100 mL/hr over 60 Minutes Intravenous Every 8 hours 01/01/20 0736     12/26/19 0800  vancomycin (VANCOREADY) IVPB 1500 mg/300 mL  Status:  Discontinued     1,500 mg 150 mL/hr over 120 Minutes Intravenous Every 12 hours 12/25/19 1902 12/26/19 1105   12/25/19 1900  ceFEPIme (MAXIPIME) 2 g in sodium chloride 0.9 % 100 mL IVPB     2 g 200 mL/hr over 30 Minutes Intravenous Every 8 hours 12/25/19 1844     12/25/19 1900  vancomycin (VANCOREADY) IVPB 1750 mg/350 mL     1,750 mg 175 mL/hr over 120 Minutes Intravenous  Once 12/25/19 1845 12/25/19 2347      Medications:  Scheduled: . sodium chloride   Intravenous Once  . chlorhexidine  15 mL Mouth Rinse BID  . Chlorhexidine Gluconate Cloth  6 each Topical Daily  . feeding supplement (KATE FARMS STANDARD 1.4)  325 mL Oral Q24H  . fenofibrate  54 mg Oral Daily  . ferrous sulfate  325 mg Oral Q breakfast  . Gerhardt's butt cream   Topical BID  . insulin aspart  0-15 Units Subcutaneous TID WC  . insulin aspart  0-5 Units Subcutaneous QHS  . insulin  aspart  3 Units Subcutaneous TID WC  . mouth rinse  15 mL Mouth Rinse q12n4p  . methocarbamol  750 mg Oral TID  . methylPREDNISolone (SOLU-MEDROL) injection  40 mg Intravenous Q12H  . multivitamin with minerals  1 tablet Oral Daily  . pantoprazole sodium  40 mg Oral BID  . pravastatin  40 mg Oral Daily  . protein supplement  2 Scoop Oral TID WC  . sodium chloride flush  10-40 mL Intracatheter Q12H    Objective: Vital signs in last 24 hours: Temp:  [96.6 F (35.9 C)-99.1 F (37.3 C)] 98.6 F (37 C) (02/17 0700) Pulse Rate:  [82-110] 109 (02/17 0700) Resp:  [11-22] 14 (02/17 0700) BP: (82-119)/(42-72) 119/72 (02/17 0700) SpO2:  [93 %-100 %] 97 % (02/17 0700)   General appearance: icteric and no distress Resp: clear to auscultation bilaterally Chest wall: no tenderness, port site clean.  Cardio: regular rate and rhythm GI: normal findings: bowel sounds normal and soft, non-tender  Lab Results Recent Labs    01/01/20 0500 01/02/20 0518  WBC 2.2* 2.0*  HGB 8.8* 8.1*  HCT 29.6* 27.7*  NA 152* 153*  K 4.3 3.9  CL 125* 127*  CO2 18* 17*  BUN 44* 47*  CREATININE 0.73 0.82  Liver Panel Recent Labs    01/01/20 0500 01/02/20 0518  PROT 4.3* 4.2*  ALBUMIN 1.9* 2.0*  AST 104* 98*  ALT 105* 100*  ALKPHOS 949* 842*  BILITOT 8.5* 8.0*   Sedimentation Rate No results for input(s): ESRSEDRATE in the last 72 hours. C-Reactive Protein No results for input(s): CRP in the last 72 hours.  Microbiology: Recent Results (from the past 240 hour(s))  MRSA PCR Screening     Status: None   Collection Time: 12/25/19  4:37 PM   Specimen: Nasal Mucosa; Nasopharyngeal  Result Value Ref Range Status   MRSA by PCR NEGATIVE NEGATIVE Final    Comment:        The GeneXpert MRSA Assay (FDA approved for NASAL specimens only), is one component of a comprehensive MRSA colonization surveillance program. It is not intended to diagnose MRSA infection nor to guide or monitor treatment  for MRSA infections. Performed at [facility], [place] [address], [place], [place] [address]   Culture, blood (Routine X 2) w Reflex to ID Panel     Status: Abnormal   Collection Time: 12/25/19  7:25 PM   Specimen: BLOOD RIGHT ARM  Result Value Ref Range Status   Specimen Description   Final    BLOOD RIGHT ARM Performed at [facility], [place] [address], [place], [place] [address]    Special Requests   Final    BOTTLES DRAWN AEROBIC AND ANAEROBIC Blood Culture adequate volume Performed at [place] [address] [place], [place] [address]    Culture  Setup Time   Final    GRAM NEGATIVE RODS IN BOTH AEROBIC AND ANAEROBIC BOTTLES CRITICAL RESULT CALLED TO, READ BACK BY AND VERIFIED WITH: Seleta Rhymes PharmD 10:55 12/26/19 ([email or web address])    Culture (A)  Final    ENTEROBACTER CLOACAE VIRIDANS STREPTOCOCCUS ORGANISM 2 THE SIGNIFICANCE OF ISOLATING THIS ORGANISM FROM A SINGLE SET OF BLOOD CULTURES WHEN MULTIPLE SETS ARE DRAWN IS UNCERTAIN. PLEASE NOTIFY THE MICROBIOLOGY DEPARTMENT WITHIN ONE WEEK IF SPECIATION AND SENSITIVITIES ARE REQUIRED. Performed at [facility], [place] [address] [place], [place] [address]    Report Status 12/29/2019 FINAL  Final   Organism ID, Bacteria ENTEROBACTER CLOACAE  Final      Susceptibility   Enterobacter cloacae - MIC*    CEFAZOLIN >=64 RESISTANT Resistant     CEFEPIME <=0.12 SENSITIVE Sensitive     CEFTAZIDIME <=1 SENSITIVE Sensitive     CIPROFLOXACIN <=0.25 SENSITIVE Sensitive     GENTAMICIN <=1 SENSITIVE Sensitive     IMIPENEM 0.5 SENSITIVE Sensitive     TRIMETH/SULFA <=20 SENSITIVE Sensitive     PIP/TAZO <=4 SENSITIVE Sensitive     * ENTEROBACTER CLOACAE  Culture, blood (Routine X 2) w Reflex to ID Panel     Status: Abnormal   Collection Time: 12/25/19  7:25 PM   Specimen: BLOOD RIGHT ARM  Result Value Ref Range Status   Specimen Description   Final    BLOOD RIGHT ARM Performed at [facility], [place] [address], [place], [place] [address]    Special Requests   Final    BOTTLES DRAWN AEROBIC ONLY Blood Culture adequate volume Performed at [facility] [address], [place], [place] [address]    Culture  Setup Time   Final    GRAM NEGATIVE RODS AEROBIC BOTTLE ONLY CRITICAL VALUE NOTED.  VALUE IS CONSISTENT WITH PREVIOUSLY REPORTED AND CALLED VALUE.    Culture (  A)  Final    ENTEROBACTER CLOACAE SUSCEPTIBILITIES PERFORMED ON PREVIOUS CULTURE WITHIN THE LAST 5 DAYS. Performed at [facility], [place] [address] [place], [place] [address]    Report Status 12/28/2019 FINAL  Final  Blood Culture ID Panel (Reflexed)     Status: Abnormal   Collection Time: 12/25/19  7:25 PM  Result Value Ref Range Status   Enterococcus species NOT DETECTED NOT DETECTED Final   Listeria monocytogenes NOT DETECTED NOT DETECTED Final   Staphylococcus species NOT DETECTED NOT DETECTED Final   Staphylococcus aureus (BCID) NOT DETECTED NOT DETECTED Final   Streptococcus species NOT DETECTED NOT DETECTED Final   Streptococcus agalactiae NOT DETECTED NOT DETECTED Final   Streptococcus pneumoniae NOT DETECTED NOT DETECTED Final   Streptococcus pyogenes NOT DETECTED NOT DETECTED Final   Acinetobacter baumannii NOT DETECTED NOT DETECTED Final   Enterobacteriaceae species DETECTED (A) NOT DETECTED Final    Comment: Enterobacteriaceae represent a large family of gram-negative bacteria, not a single organism. CRITICAL RESULT CALLED TO, READ BACK BY AND VERIFIED WITH: Seleta Rhymes PharmD 10:55 12/26/19 ([email or web address])    Enterobacter cloacae complex DETECTED (A) NOT DETECTED Final    Comment: CRITICAL RESULT CALLED TO, READ BACK BY AND VERIFIED WITH: Seleta Rhymes PharmD 10:55 12/26/19 ([email or web address])    Escherichia coli NOT DETECTED NOT DETECTED Final   Klebsiella oxytoca NOT DETECTED NOT DETECTED Final   Klebsiella pneumoniae NOT DETECTED NOT DETECTED Final   Proteus species NOT DETECTED NOT  DETECTED Final   Serratia marcescens NOT DETECTED NOT DETECTED Final   Carbapenem resistance NOT DETECTED NOT DETECTED Final   Haemophilus influenzae NOT DETECTED NOT DETECTED Final   Neisseria meningitidis NOT DETECTED NOT DETECTED Final   Pseudomonas aeruginosa NOT DETECTED NOT DETECTED Final   Candida albicans NOT DETECTED NOT DETECTED Final   Candida glabrata NOT DETECTED NOT DETECTED Final   Candida krusei NOT DETECTED NOT DETECTED Final   Candida parapsilosis NOT DETECTED NOT DETECTED Final   Candida tropicalis NOT DETECTED NOT DETECTED Final    Comment: Performed at [facility], [place] [address] [place], [place] [address]  Body fluid culture (includes gram stain)     Status: None   Collection Time: 12/27/19  1:54 PM   Specimen: Pleural Fluid  Result Value Ref Range Status   Specimen Description   Final    PLEURAL Performed at [place] [address] [place], [place] [address]    Special Requests   Final    Immunocompromised Performed at [facility], [place] [address] [place], [place] [address]    Gram Stain   Final    RARE WBC PRESENT, PREDOMINANTLY MONONUCLEAR NO ORGANISMS SEEN    Culture   Final    NO GROWTH 3 DAYS Performed at [facility], [place] [address], [place], [place] [address]    Report Status 12/31/2019 FINAL  Final  Body fluid culture (includes gram stain)     Status: None   Collection Time: 12/28/19 10:48 AM   Specimen: Pleural Fluid  Result Value Ref Range Status   Specimen Description   Final    PLEURAL Performed at [place] [address] [place],  [address]    Special Requests   Final    Immunocompromised Performed at [facility], [place] [address], [place], [place] [address]    Gram Stain   Final    FEW WBC PRESENT, PREDOMINANTLY MONONUCLEAR NO ORGANISMS SEEN  Culture   Final    NO GROWTH 3 DAYS Performed at [facility], [place] [address] [place], [place] [address]    Report Status 12/31/2019 FINAL  Final  Culture, blood (Routine X 2) w Reflex to ID Panel     Status: None (Preliminary result)   Collection Time: 12/29/19  4:35 PM   Specimen: BLOOD  Result Value Ref Range Status   Specimen Description   Final    BLOOD LEFT WRIST Performed at [facility] [address], [place], [place] [address]    Special Requests   Final    BOTTLES DRAWN AEROBIC ONLY Blood Culture adequate volume Performed at [facility] [address], [place], [place] [address]    Culture   Final    NO GROWTH 4 DAYS Performed at [facility], [place] [address] [place], [place] [address]    Report Status PENDING  Incomplete  Culture, blood (Routine X 2) w Reflex to ID Panel     Status: None (Preliminary result)   Collection Time: 12/29/19  4:40 PM   Specimen: BLOOD LEFT HAND  Result Value Ref Range Status   Specimen Description   Final    BLOOD LEFT HAND Performed at [facility] [address] [place], [place] [address]    Special Requests   Final    BOTTLES DRAWN AEROBIC ONLY Blood Culture adequate volume Performed at [place] [address] [place], [place] [address]    Culture   Final    NO GROWTH 4 DAYS Performed at [facility], [place] [address], [place], [place] [address]    Report Status PENDING  Incomplete    Studies/Results: DG CHEST PORT 1 VIEW  Result Date: 01/01/2020 CLINICAL DATA:  Shortness of breath EXAM: PORTABLE CHEST 1 VIEW COMPARISON:  12/29/2019 FINDINGS: Cardiac shadow is stable. Right-sided chest wall port is again seen and stable. Small pleural effusions are again noted bilaterally. No bony abnormality is noted. IMPRESSION: Stable bilateral pleural effusions. Electronically Signed   By: Inez Catalina M.D.   On: 01/01/2020 08:58   US Abdomen Limited RUQ  Result Date: 01/01/2020 CLINICAL DATA:  Increased LFTs, liver  metastatic disease EXAM: ULTRASOUND ABDOMEN LIMITED RIGHT UPPER QUADRANT COMPARISON:  PET-CT from 12/04/19 FINDINGS: Gallbladder: Gallbladder is decompressed with gallstones within. Sludge is noted as well. These changes are similar to that seen on prior exam. Common bile duct: Diameter: 3 mm Liver: Multiple up attic metastatic lesions are seen. Mild ascites is noted as well as small right-sided pleural effusion. These changes are similar to that seen on prior PET-CT. Portal vein is patent on color Doppler imaging with normal direction of blood flow towards the liver. Other: None. IMPRESSION: Liver mets.  No biliary ductal dilatation is noted. Contracted gallbladder with gallstones and sludge within. Mild ascites and right pleural effusion. Electronically Signed   By: Inez Catalina M.D.   On: 01/01/2020 09:01     Assessment/Plan: SCCa of unclear origin CTX 12-17-19 (Carbo/Paclitaxel)  Next dose tentatively scheduled for 01-07-20 Pancytopenia Streptococcal bacteremia 1/4 (2-9) Enterobacter bacteremia 3/4 (2-9) ThoracentesisR 2-11, L 2-12 Cx pending. Repeat BCx 2-13 pending  Total days of antibiotics:8cefepime, D 1 vanco/flagyl  bp and temps better Continued cytopenias.  Watch for bleeding with his PLT# < 10 PLT txf this am No change in anbx but suspect his severe metastatic disease will be hard to overcome Appreciate continued hospice f/u.  Bobby Rumpf MD, FACP Infectious Diseases (pager) [phone] [email or web address] 01/02/2020, 8:12 AM  LOS: 14 days

## 2020-01-02 NOTE — Progress Notes (Signed)
Daily Progress Note   Patient Name: Kenneth Khan.       Date: 01/02/2020 DOB: 07-02-1966  Age: 54 y.o. MRN#: [id number] Attending Physician: Isaac Bliss, Olam Idler* Primary Care Physician: Nicholes Rough, PA-C Admit Date: 01/08/2020  Reason for Consultation/Follow-up: Establishing goals of care  Subjective:  Kenneth Khan is not alert, he opens his eyes, but doesn't meaningfully verbalize or interact. He seems to be in mild to moderate distress, he has generalized discomfort, moans at times, he lifts his upper extremities, seems to be reaching for something.   Patient seen earlier today, returned to bedside after fiance [name] arrived. See below.   Length of Stay: 14  Current Medications: Scheduled Meds:  . sodium chloride   Intravenous Once  . chlorhexidine  15 mL Mouth Rinse BID  . Chlorhexidine Gluconate Cloth  6 each Topical Daily  . feeding supplement (KATE [employer]ARMS STANDARD 1.4)  325 mL Oral Q24H  . fenofibrate  54 mg Oral Daily  . ferrous sulfate  325 mg Oral Q breakfast  . Gerhardt's butt cream   Topical BID  . insulin aspart  0-15 Units Subcutaneous TID WC  . insulin aspart  0-5 Units Subcutaneous QHS  . insulin aspart  3 Units Subcutaneous TID WC  . mouth rinse  15 mL Mouth Rinse q12n4p  . methocarbamol  750 mg Oral TID  . methylPREDNISolone (SOLU-MEDROL) injection  40 mg Intravenous Q12H  . multivitamin with minerals  1 tablet Oral Daily  . pantoprazole sodium  40 mg Oral BID  . pravastatin  40 mg Oral Daily  . protein supplement  2 Scoop Oral TID WC  . sodium chloride flush  10-40 mL Intracatheter Q12H    Continuous Infusions: . ceFEPime (MAXIPIME) IV Stopped (01/02/20 0746)  . metronidazole Stopped (01/02/20 1226)  . ondansetron (ZOFRAN) IV Stopped (12/27/19 0916)  .  vancomycin 150 mL/hr at 01/02/20 1400    PRN Meds: acetaminophen **OR** acetaminophen, alum & mag hydroxide-simeth **AND** [COMPLETED] lidocaine, calcium carbonate, diphenhydrAMINE, lip balm, LORazepam, magic mouthwash w/lidocaine, Melatonin, ondansetron (ZOFRAN) IV, oxyCODONE, prochlorperazine, sodium chloride flush  Physical Exam         Awakens some, laying in bed Not alert, not interactive, doesn't verbalize S1 S2 Has muscle wasting, appears weak No edema Regular work of breathing Abdomen is not distended  Vital Signs: BP 97/63   Pulse (!) 103   Temp 98.4 F (36.9 C)   Resp 20   Ht 5' 9" (1.753 m)   Wt 81.5 kg   SpO2 94%   BMI 26.53 kg/m  SpO2: SpO2: 94 % O2 Device: O2 Device: Room Air O2 Flow Rate: O2 Flow Rate (L/min): 2 L/min  Intake/output summary:   Intake/Output Summary (Last 24 hours) at 01/02/2020 1413 Last data filed at 01/02/2020 1400 Gross per 24 hour  Intake 1760.4 ml  Output 1195 ml  Net 565.4 ml   LBM: Last BM Date: 12/31/19 Baseline Weight: Weight: 81.5 kg Most recent weight: Weight: 81.5 kg       Palliative Assessment/Data:    Flowsheet Rows     Most Recent Value  Intake Tab  Referral Department  Hospitalist  Unit at Time of Referral  ICU  Palliative Care Primary Diagnosis  Cancer  Date Notified  12/26/19  Palliative Care Type  New Palliative care  Reason for referral  Clarify Goals of Care, Psychosocial or Spiritual support  Date of Admission  01/09/2020  Date first seen by Palliative Care  12/28/19  # of days Palliative referral response time  2 Day(s)  # of days IP prior to Palliative referral  7  Clinical Assessment  Palliative Performance Scale Score  40%  Psychosocial & Spiritual Assessment  Palliative Care Outcomes  Patient/Family meeting held?  Yes  Who was at the meeting?  Patient, fiancee      Patient Active Problem List   Diagnosis Date Noted  . Abnormal liver enzymes   . Bacteremia due to Enterobacter species  12/29/2019  . Streptococcal bacteremia 12/29/2019  . Bilateral pleural effusion 12/28/2019  . Status post thoracentesis   . Sepsis without acute organ dysfunction ([place])   . Pancytopenia ([place])   . Hypotension due to hypovolemia   . Pressure injury of skin 12/25/2019  . Ambulatory dysfunction 01/05/2020  . Port-A-Cath in place 12/06/2019  . Pancreatic mass 11/04/2019  . 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 Functional decline Strep bacteremia with sepsis Anasarca bilateral pleural effusions Transaminitis Leukopenia, thrombocytopenia.   Recommendations/Plan:   family meeting with fiance Kenneth Khan at bedside. We did a brief life review, patient's mother lives in [place] and has been informed by [name] about the patient's hospitalization. Patient and [name] met at work, he is described as a kind loving gentleman, they have a lot of friends. We reviewed about the patient's current condition. Patient with ongoing debility and decline. Discussed frankly but compassionately with [name] about end of life signs and symptoms, nature of the patient's illness and about making decisions such as DNR DNI and comfort focused approach to care.  [name] is overwhelmed with the patient's rapid decline, she will step outside and place a few phone calls. She also wishes to discuss/get updates from [facility] MD.  Will add low dose IV Dilaudid PRN for now.     Code Status:    Code Status Orders  (From admission, onward)         Start     Ordered   01/12/2020 1827  Full code  Continuous     01/09/2020 1826        Code Status History    Date Active Date  Inactive Code Status Order ID Comments User Context   11/03/2019 1352 11/06/2019 1749 Full Code [id number]  Harvie Heck, MD ED   Advance Care  Planning Activity       Prognosis:   Hours - Days  Discharge Planning:  Anticipated Hospital Death  Care plan was discussed with  Chaplain, bedside RN, also discussed with fiance Kenneth Khan.   Thank you for allowing the Palliative Medicine Team to assist in the care of this patient.   Time In: 1330 Time Out: 1405 Total Time 35 Prolonged Time Billed  no       Greater than 50%  of this time was spent counseling and coordinating care related to the above assessment and plan.  Loistine Chance, MD  Please contact Palliative Medicine Team phone at [phone] for questions and concerns.

## 2020-01-02 NOTE — Progress Notes (Signed)
PROGRESS NOTE    Kenneth Khan.  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  He presented from his oncologist's office secondary to severe deconditioning and generalized weakness.  He has been in the stepdown unit since 2/9 due to hypotension, SIRS, he was found to have strep viridans bacteremia with sepsis.  He continues to have significant cytopenias.   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 -ID is following.  Appreciate their excellent care and assistance.  He remains on cefepime day 8, Flagyl day 1 and vancomycin day 1. -Repeat blood cultures from 2/13 remain negative at day 4.  Metastatic squamous cell carcinoma of bone with unknown primary -Unknown primary, CT scan showed ill-defined mass within the pancreatic tail and lesions throughout the liver.  Liver biopsy did show metastatic squamous cell carcinoma. -Currently on radiation and chemotherapy per oncology. -Severe cytopenias, see below.  Pancytopenia secondary to chemotherapy -WBC 2.0, platelet count dropped again to 10, hemoglobin 8.1. -Watch platelets closely, if drops again less than 10 or signs of bleeding will need to transfuse. -Check CBC in  a.m.  Large pleural effusions -Status post right thoracentesis on 2/11 with 1.8 L out. -Status post left thoracentesis on 2/12 with 800 cc removed. -Currently on room air.  Transaminitis -Secondary to metastatic disease most likely, slight trend downwards today.  History of hypertension -BP remains in the low normal range, off antihypertensive therapy at this point.  Pressure ulcers -Present prior to admission -Buttocks left mid stage II -Bilateral heel stage I -Continue wound care per nursing  Generalized deconditioning -CIR has been recommended but placed on hold given current medical situation -Appreciate palliative medicine following, suspect we are dealing with end-of-life at this point.    DVT prophylaxis: SCDs Code Status: Full code Family Communication: Patient only, no family at bedside Disposition Plan: Undetermined, CIR if medical improvement (doubtful).  Consultants:   ID  Oncology  Procedures:   Right thoracentesis 2/11  Left thoracentesis 2/12  Antimicrobials:  Anti-infectives (From admission, onward)   Start     Dose/Rate Route Frequency Ordered Stop   01/01/20 0900  vancomycin (VANCOREADY) IVPB 1500 mg/300 mL     1,500 mg 150 mL/hr over 120 Minutes Intravenous Every 12 hours 01/01/20 0759     01/01/20 0800  metroNIDAZOLE (FLAGYL) IVPB 500 mg     500 mg 100 mL/hr over 60 Minutes Intravenous Every 8 hours 01/01/20 0736     12/26/19 0800  vancomycin (VANCOREADY) IVPB 1500 mg/300 mL  Status:  Discontinued     1,500 mg 150 mL/hr over 120 Minutes Intravenous Every 12 hours 12/25/19 1902 12/26/19 1105   12/25/19 1900  ceFEPIme (MAXIPIME) 2 g in sodium chloride 0.9 % 100 mL IVPB     2 g 200  mL/hr over 30 Minutes Intravenous Every 8 hours 12/25/19 1844     12/25/19 1900  vancomycin (VANCOREADY) IVPB 1750 mg/350 mL     1,750 mg 175 mL/hr over 120 Minutes Intravenous  Once 12/25/19 1845 12/25/19 2347       Subjective: Eyes open but not answering  questions.  Objective: Vitals:   01/02/20 0400 01/02/20 0500 01/02/20 0600 01/02/20 0700  BP: 103/61 111/67 114/68 119/72  Pulse: 98 (!) 107 (!) 110 (!) 109  Resp: 14 15 14 14   Temp: 98.8 F (37.1 C) 98.6 F (37 C) 98.6 F (37 C) 98.6 F (37 C)  TempSrc:    Bladder  SpO2: 94% 95% 94% 97%  Weight:      Height:        Intake/Output Summary (Last 24 hours) at 01/02/2020 0807 Last data filed at 01/02/2020 0700 Gross per 24 hour  Intake 1307.01 ml  Output 1210 ml  Net 97.01 ml   Filed Weights   12/25/19 1800  Weight: 81.5 kg    Examination:  General exam: Awake but not oriented, nonverbal Respiratory system: Decreased breath sounds to bilateral bases. Respiratory effort normal. Cardiovascular system:RRR. No murmurs, rubs, gallops. Gastrointestinal system: Abdomen is nondistended, soft and nontender. No organomegaly or masses felt. Normal bowel sounds heard. Central nervous system: Unable to fully assess given current mental state Extremities: 2+ pitting edema bilaterally Skin: No rashes, lesions or ulcers Psychiatry: Unable to fully assess given current mental state    Data Reviewed: I have personally reviewed following labs and imaging studies  CBC: Recent Labs  Lab 12/29/19 1311 12/29/19 1311 12/30/19 0500 12/31/19 0500 12/31/19 1630 01/01/20 0500 01/02/20 0518  WBC 2.0*   < > 2.4* 1.9* 2.1* 2.2* 2.0*  NEUTROABS 1.7  --   --   --   --   --   --   HGB 9.0*   < > 9.4* 9.3* 9.1* 8.8* 8.1*  HCT 29.1*   < > 31.8* 31.7* 30.9* 29.6* 27.7*  MCV 73.7*   < > 75.2* 75.8* 76.7* 76.5* 77.6*  PLT 18*   < > 9* 6* 28* 21* 10*   < > = values in this interval not displayed.   Basic Metabolic Panel: Recent Labs  Lab 12/27/19 0544 12/27/19 1038 12/28/19 0358 12/28/19 0358 12/29/19 0500 12/30/19 0500 12/31/19 0500 01/01/20 0500 01/02/20 0518  NA   < >  --  142   < > 143 147* 152* 152* 153*  K   < >  --  3.7   < > 3.6 3.8 4.1 4.3 3.9  CL   < >  --  113*   < > 115*  120* 124* 125* 127*  CO2   < >  --  17*   < > 18* 20* 19* 18* 17*  GLUCOSE   < >  --  228*   < > 250* 216* 200* 205* 167*  BUN   < >  --  38*   < > 37* 36* 39* 44* 47*  CREATININE   < >  --  0.74   < > 0.69 0.61 0.56* 0.73 0.82  CALCIUM   < >  --  7.3*   < > 7.3* 7.5* 7.7* 7.4* 7.6*  MG  --  2.1 2.1  --   --   --   --   --   --   PHOS  --   --  3.1  --   --   --   --   --   --    < > =  values in this interval not displayed.   GFR: Estimated Creatinine Clearance: 103 mL/min (by C-G formula based on SCr of 0.82 mg/dL). Liver Function Tests: Recent Labs  Lab 12/29/19 0500 12/30/19 0500 12/31/19 0500 01/01/20 0500 01/02/20 0518  AST 70* 90* 81* 104* 98*  ALT 83* 103* 107* 105* 100*  ALKPHOS 803* 972* 931* 949* 842*  BILITOT 6.8* 7.6* 7.7* 8.5* 8.0*  PROT 4.5* 4.5* 4.3* 4.3* 4.2*  ALBUMIN 2.4* 2.2* 2.0* 1.9* 2.0*   Recent Labs  Lab 12/27/19 1038  LIPASE 21  AMYLASE 22*   No results for input(s): AMMONIA in the last 168 hours. Coagulation Profile: No results for input(s): INR, PROTIME in the last 168 hours. Cardiac Enzymes: No results for input(s): CKTOTAL, CKMB, CKMBINDEX, TROPONINI in the last 168 hours. BNP (last 3 results) No results for input(s): PROBNP in the last 8760 hours. HbA1C: No results for input(s): HGBA1C in the last 72 hours. CBG: Recent Labs  Lab 01/01/20 0744 01/01/20 1154 01/01/20 1611 01/01/20 2122 01/02/20 0756  GLUCAP 208* 142* 128* 127* 144*   Lipid Profile: No results for input(s): CHOL, HDL, LDLCALC, TRIG, CHOLHDL, LDLDIRECT in the last 72 hours. Thyroid Function Tests: No results for input(s): TSH, T4TOTAL, FREET4, T3FREE, THYROIDAB in the last 72 hours. Anemia Panel: No results for input(s): VITAMINB12, FOLATE, FERRITIN, TIBC, IRON, RETICCTPCT in the last 72 hours. Urine analysis:    Component Value Date/Time   COLORURINE YELLOW 01/01/2020 1201   APPEARANCEUR CLEAR 01/01/2020 1201   LABSPEC 1.015 01/01/2020 1201   PHURINE 6.0  01/01/2020 1201   GLUCOSEU NEGATIVE 01/01/2020 1201   HGBUR LARGE (A) 01/01/2020 1201   BILIRUBINUR MODERATE (A) 01/01/2020 1201   KETONESUR TRACE (A) 01/01/2020 1201   PROTEINUR 100 (A) 01/01/2020 1201   NITRITE NEGATIVE 01/01/2020 1201   LEUKOCYTESUR NEGATIVE 01/01/2020 1201   Sepsis Labs: @LABRCNTIP (procalcitonin:4,lacticidven:4)  ) Recent Results (from the past 240 hour(s))  MRSA PCR Screening     Status: None   Collection Time: 12/25/19  4:37 PM   Specimen: Nasal Mucosa; Nasopharyngeal  Result Value Ref Range Status   MRSA by PCR NEGATIVE NEGATIVE Final    Comment:        The GeneXpert MRSA Assay (FDA approved for NASAL specimens only), is one component of a comprehensive MRSA colonization surveillance program. It is not intended to diagnose MRSA infection nor to guide or monitor treatment for MRSA infections. Performed at [facility], [place] [address] [place], [place] [address]   Culture, blood (Routine X 2) w Reflex to ID Panel     Status: Abnormal   Collection Time: 12/25/19  7:25 PM   Specimen: BLOOD RIGHT ARM  Result Value Ref Range Status   Specimen Description   Final    BLOOD RIGHT ARM Performed at [facility], [place] [address] [place], [place] [address]    Special Requests   Final    BOTTLES DRAWN AEROBIC AND ANAEROBIC Blood Culture adequate volume Performed at [place] [address], [place], [place] [address]    Culture  Setup Time   Final    GRAM NEGATIVE RODS IN BOTH AEROBIC AND ANAEROBIC BOTTLES CRITICAL RESULT CALLED TO, READ BACK BY AND VERIFIED WITH: Seleta Rhymes PharmD 10:55 12/26/19 ([email or web address])    Culture (A)  Final    ENTEROBACTER CLOACAE VIRIDANS STREPTOCOCCUS ORGANISM 2 THE SIGNIFICANCE OF ISOLATING THIS ORGANISM FROM A SINGLE SET OF BLOOD CULTURES WHEN MULTIPLE SETS ARE DRAWN IS UNCERTAIN. PLEASE  NOTIFY THE MICROBIOLOGY DEPARTMENT WITHIN ONE WEEK IF SPECIATION AND SENSITIVITIES ARE  REQUIRED. Performed at [facility], [place] [address] [place], [place] [address]    Report Status 12/29/2019 FINAL  Final   Organism ID, Bacteria ENTEROBACTER CLOACAE  Final      Susceptibility   Enterobacter cloacae - MIC*    CEFAZOLIN >=64 RESISTANT Resistant     CEFEPIME <=0.12 SENSITIVE Sensitive     CEFTAZIDIME <=1 SENSITIVE Sensitive     CIPROFLOXACIN <=0.25 SENSITIVE Sensitive     GENTAMICIN <=1 SENSITIVE Sensitive     IMIPENEM 0.5 SENSITIVE Sensitive     TRIMETH/SULFA <=20 SENSITIVE Sensitive     PIP/TAZO <=4 SENSITIVE Sensitive     * ENTEROBACTER CLOACAE  Culture, blood (Routine X 2) w Reflex to ID Panel     Status: Abnormal   Collection Time: 12/25/19  7:25 PM   Specimen: BLOOD RIGHT ARM  Result Value Ref Range Status   Specimen Description   Final    BLOOD RIGHT ARM Performed at [facility], [place] [address], [place], [place] [address]    Special Requests   Final    BOTTLES DRAWN AEROBIC ONLY Blood Culture adequate volume Performed at [place] [address] [place], [place] [address]    Culture  Setup Time   Final    GRAM NEGATIVE RODS AEROBIC BOTTLE ONLY CRITICAL VALUE NOTED.  VALUE IS CONSISTENT WITH PREVIOUSLY REPORTED AND CALLED VALUE.    Culture (A)  Final    ENTEROBACTER CLOACAE SUSCEPTIBILITIES PERFORMED ON PREVIOUS CULTURE WITHIN THE LAST 5 DAYS. Performed at [facility], [place] [address], [place], [place] [address]    Report Status 12/28/2019 FINAL  Final  Blood Culture ID Panel (Reflexed)     Status: Abnormal   Collection Time: 12/25/19  7:25 PM  Result Value Ref Range Status   Enterococcus species NOT DETECTED NOT DETECTED Final   Listeria monocytogenes NOT DETECTED NOT DETECTED Final   Staphylococcus species NOT DETECTED NOT DETECTED Final   Staphylococcus aureus (BCID) NOT DETECTED NOT DETECTED Final   Streptococcus species NOT DETECTED NOT DETECTED Final   Streptococcus agalactiae NOT DETECTED NOT  DETECTED Final   Streptococcus pneumoniae NOT DETECTED NOT DETECTED Final   Streptococcus pyogenes NOT DETECTED NOT DETECTED Final   Acinetobacter baumannii NOT DETECTED NOT DETECTED Final   Enterobacteriaceae species DETECTED (A) NOT DETECTED Final    Comment: Enterobacteriaceae represent a large family of gram-negative bacteria, not a single organism. CRITICAL RESULT CALLED TO, READ BACK BY AND VERIFIED WITH: Seleta Rhymes PharmD 10:55 12/26/19 ([email or web address])    Enterobacter cloacae complex DETECTED (A) NOT DETECTED Final    Comment: CRITICAL RESULT CALLED TO, READ BACK BY AND VERIFIED WITH: Seleta Rhymes PharmD 10:55 12/26/19 ([email or web address])    Escherichia coli NOT DETECTED NOT DETECTED Final   Klebsiella oxytoca NOT DETECTED NOT DETECTED Final   Klebsiella pneumoniae NOT DETECTED NOT DETECTED Final   Proteus species NOT DETECTED NOT DETECTED Final   Serratia marcescens NOT DETECTED NOT DETECTED Final   Carbapenem resistance NOT DETECTED NOT DETECTED Final   Haemophilus influenzae NOT DETECTED NOT DETECTED Final   Neisseria meningitidis NOT DETECTED NOT DETECTED Final   Pseudomonas aeruginosa NOT DETECTED NOT DETECTED Final   Candida albicans NOT DETECTED NOT DETECTED Final   Candida glabrata NOT DETECTED NOT DETECTED Final   Candida krusei NOT DETECTED NOT DETECTED Final   Candida parapsilosis NOT DETECTED NOT DETECTED Final   Candida  tropicalis NOT DETECTED NOT DETECTED Final    Comment: Performed at [facility], [place] [address] [place], [place] [address]  Body fluid culture (includes gram stain)     Status: None   Collection Time: 12/27/19  1:54 PM   Specimen: Pleural Fluid  Result Value Ref Range Status   Specimen Description   Final    PLEURAL Performed at [place] [address] [place], [place] [address]    Special Requests   Final    Immunocompromised Performed at [facility], [place] [address], [place], [place] [address]    Gram Stain    Final    RARE WBC PRESENT, PREDOMINANTLY MONONUCLEAR NO ORGANISMS SEEN    Culture   Final    NO GROWTH 3 DAYS Performed at [facility], [place] [address] [place], [place] [address]    Report Status 12/31/2019 FINAL  Final  Body fluid culture (includes gram stain)     Status: None   Collection Time: 12/28/19 10:48 AM   Specimen: Pleural Fluid  Result Value Ref Range Status   Specimen Description   Final    PLEURAL Performed at [place] [address], [place], [place] [address]    Special Requests   Final    Immunocompromised Performed at [facility], [place] [address] [place], [place] [address]    Gram Stain   Final    FEW WBC PRESENT, PREDOMINANTLY MONONUCLEAR NO ORGANISMS SEEN    Culture   Final    NO GROWTH 3 DAYS Performed at [place] [address] [place], [place] [address]    Report Status 12/31/2019 FINAL  Final  Culture, blood (Routine X 2) w Reflex to ID Panel     Status: None (Preliminary result)   Collection Time: 12/29/19  4:35 PM   Specimen: BLOOD  Result Value Ref Range Status   Specimen Description   Final    BLOOD LEFT WRIST Performed at [place] [address] [place], [place] [address]    Special Requests   Final    BOTTLES DRAWN AEROBIC ONLY Blood Culture adequate volume Performed at [place] [address], [place], [place] [address]    Culture   Final    NO GROWTH 4 DAYS Performed at [facility], [place] [address], [place], [place] [address]    Report Status PENDING  Incomplete  Culture, blood (Routine X 2) w Reflex to ID Panel     Status: None (Preliminary result)   Collection Time: 12/29/19  4:40 PM   Specimen: BLOOD LEFT HAND  Result Value Ref Range Status   Specimen Description   Final    BLOOD LEFT HAND Performed at [place] [address], [place], [place] [address]    Special Requests   Final     BOTTLES DRAWN AEROBIC ONLY Blood Culture adequate volume Performed at [place] [address], [place], [place] [address]    Culture   Final    NO GROWTH 4 DAYS Performed at [facility], [place] [address], [place],  [address]    Report Status PENDING  Incomplete         Radiology Studies: DG CHEST PORT 1 VIEW  Result Date: 01/01/2020 CLINICAL DATA:  Shortness of breath EXAM: PORTABLE CHEST 1 VIEW COMPARISON:  12/29/2019 FINDINGS: Cardiac shadow is stable. Right-sided chest wall port  is again seen and stable. Small pleural effusions are again noted bilaterally. No bony abnormality is noted. IMPRESSION: Stable bilateral pleural effusions. Electronically Signed   By: Inez Catalina M.D.   On: 01/01/2020 08:58   US Abdomen Limited RUQ  Result Date: 01/01/2020 CLINICAL DATA:  Increased LFTs, liver metastatic disease EXAM: ULTRASOUND ABDOMEN LIMITED RIGHT UPPER QUADRANT COMPARISON:  PET-CT from 12/04/19 FINDINGS: Gallbladder: Gallbladder is decompressed with gallstones within. Sludge is noted as well. These changes are similar to that seen on prior exam. Common bile duct: Diameter: 3 mm Liver: Multiple up attic metastatic lesions are seen. Mild ascites is noted as well as small right-sided pleural effusion. These changes are similar to that seen on prior PET-CT. Portal vein is patent on color Doppler imaging with normal direction of blood flow towards the liver. Other: None. IMPRESSION: Liver mets.  No biliary ductal dilatation is noted. Contracted gallbladder with gallstones and sludge within. Mild ascites and right pleural effusion. Electronically Signed   By: Inez Catalina M.D.   On: 01/01/2020 09:01        Scheduled Meds: . sodium chloride   Intravenous Once  . chlorhexidine  15 mL Mouth Rinse BID  . Chlorhexidine Gluconate Cloth  6 each Topical Daily  . feeding supplement (KATE FARMS STANDARD 1.4)  325 mL Oral Q24H  . fenofibrate  54 mg Oral Daily  .  ferrous sulfate  325 mg Oral Q breakfast  . Gerhardt's butt cream   Topical BID  . insulin aspart  0-15 Units Subcutaneous TID WC  . insulin aspart  0-5 Units Subcutaneous QHS  . insulin aspart  3 Units Subcutaneous TID WC  . mouth rinse  15 mL Mouth Rinse q12n4p  . methocarbamol  750 mg Oral TID  . methylPREDNISolone (SOLU-MEDROL) injection  40 mg Intravenous Q12H  . multivitamin with minerals  1 tablet Oral Daily  . pantoprazole sodium  40 mg Oral BID  . pravastatin  40 mg Oral Daily  . protein supplement  2 Scoop Oral TID WC  . sodium chloride flush  10-40 mL Intracatheter Q12H   Continuous Infusions: . ceFEPime (MAXIPIME) IV 2 g (01/02/20 0716)  . metronidazole Stopped (01/02/20 0249)  . ondansetron (ZOFRAN) IV Stopped (12/27/19 0916)  . vancomycin Stopped (01/01/20 2249)     LOS: 14 days    Time spent: 35 minutes. Greater than 50% of this time was spent in direct contact with the patient, coordinating care and discussing relevant ongoing clinical issues.     Lelon Frohlich, MD [facility] Pager [phone]  If 7PM-7AM, please contact night-coverage [email or web address] Password [facility] 01/02/2020, 8:07 AM

## 2020-01-03 ENCOUNTER — Ambulatory Visit: Payer: Managed Care, Other (non HMO)

## 2020-01-03 ENCOUNTER — Inpatient Hospital Stay: Payer: Managed Care, Other (non HMO) | Admitting: Hematology and Oncology

## 2020-01-03 DIAGNOSIS — Z515 Encounter for palliative care: Secondary | ICD-10-CM

## 2020-01-03 LAB — PREPARE PLATELET PHERESIS: Unit division: 0

## 2020-01-03 LAB — CULTURE, BLOOD (ROUTINE X 2)
Culture: NO GROWTH
Culture: NO GROWTH
Special Requests: ADEQUATE
Special Requests: ADEQUATE

## 2020-01-03 LAB — BPAM PLATELET PHERESIS
Blood Product Expiration Date: 202102192359
ISSUE DATE / TIME: 202102170925
Unit Type and Rh: 5100

## 2020-01-03 MED ORDER — LORAZEPAM 2 MG/ML IJ SOLN
0.5000 mg | Freq: Four times a day (QID) | INTRAMUSCULAR | Status: DC | PRN
  Administered 2020-01-03: 0.5 mg via INTRAVENOUS
  Filled 2020-01-03: qty 1

## 2020-01-03 NOTE — Progress Notes (Signed)
Daily Progress Note   Patient Name: Kenneth Khan.       Date: 01/03/2020 DOB: 10-01-1966  Age: 54 y.o. MRN#: [id number] Attending Physician: Isaac Bliss, Olam Idler* Primary Care Physician: Nicholes Rough, PA-C Admit Date: 01/03/2020  Reason for Consultation/Follow-up: Terminal Care  Subjective:  now transferred out of the stepdown unit, comfortable.   Length of Stay: 15  Current Medications: Scheduled Meds:  . chlorhexidine  15 mL Mouth Rinse BID  . Chlorhexidine Gluconate Cloth  6 each Topical Daily  . feeding supplement (KATE FARMS STANDARD 1.4)  325 mL Oral Q24H  . Gerhardt's butt cream   Topical BID  . mouth rinse  15 mL Mouth Rinse q12n4p    Continuous Infusions: . morphine 5 mg/hr (01/03/20 1000)    PRN Meds: acetaminophen **OR** acetaminophen, antiseptic oral rinse, diphenhydrAMINE, glycopyrrolate **OR** glycopyrrolate **OR** glycopyrrolate, lip balm, LORazepam, magic mouthwash w/lidocaine, ondansetron **OR** ondansetron (ZOFRAN) IV, polyvinyl alcohol  Physical Exam         Comfortable No signs of distress or discomfort No edema Muscle wasting Regular  Vital Signs: BP (!) 87/46 (BP Location: Right Arm)   Pulse (!) 102   Temp 99.4 F (37.4 C) (Oral)   Resp 10   Ht 5\' 9"  (1.753 m)   Wt 81.5 kg   SpO2 (!) 85%   BMI 26.53 kg/m  SpO2: SpO2: (!) 85 % O2 Device: O2 Device: Room Air O2 Flow Rate: O2 Flow Rate (L/min): 2 L/min  Intake/output summary:   Intake/Output Summary (Last 24 hours) at 01/03/2020 1315 Last data filed at 01/03/2020 1000 Gross per 24 hour  Intake 288.89 ml  Output 425 ml  Net -136.11 ml   LBM: Last BM Date: 12/31/19 Baseline Weight: Weight: 81.5 kg Most recent weight: Weight: 81.5 kg       Palliative  Assessment/Data:    Flowsheet Rows     Most Recent Value  Intake Tab  Referral Department  Hospitalist  Unit at Time of Referral  ICU  Palliative Care Primary Diagnosis  Cancer  Date Notified  12/26/19  Palliative Care Type  New Palliative care  Reason for referral  Clarify Goals of Care, Psychosocial or Spiritual support  Date of Admission  12/27/2019  Date first seen by Palliative Care  12/28/19  # of days Palliative referral response  time  2 Day(s)  # of days IP prior to Palliative referral  7  Clinical Assessment  Palliative Performance Scale Score  40%  Psychosocial & Spiritual Assessment  Palliative Care Outcomes  Patient/Family meeting held?  Yes  Who was at the meeting?  Patient, fiancee      Patient Active Problem List   Diagnosis Date Noted  . Palliative care by specialist   . Goals of care, counseling/discussion   . General weakness   . Abnormal liver enzymes   . Bacteremia due to Enterobacter species 12/29/2019  . Streptococcal bacteremia 12/29/2019  . Bilateral pleural effusion 12/28/2019  . Status post thoracentesis   . Sepsis without acute organ dysfunction ([place])   . Pancytopenia ([place])   . Hypotension due to hypovolemia   . Pressure injury of skin 12/25/2019  . Ambulatory dysfunction 01/05/2020  . Port-A-Cath in place 12/06/2019  . Pancreatic mass 11/04/2019  . 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 Hypercalcemia 11/03/2019    Palliative Care Assessment & Plan   Patient Profile:    Assessment:  squamous cell carcinoma of bone with unknown primary site. Functional decline Strep bacteremia with sepsis Anasarca bilateral pleural effusions Transaminitis Leukopenia, thrombocytopenia.   Recommendations/Plan:  Continue comfort measures  Anticipated hospital death.   Goals of Care and Additional Recommendations:  Limitations on Scope of Treatment: Full Comfort Care  Code Status:    Code  Status Orders  (From admission, onward)         Start     Ordered   01/02/20 2044  Do not attempt resuscitation (DNR)  Continuous    Question Answer Comment  In the event of cardiac or respiratory ARREST Do not call a "code blue"   In the event of cardiac or respiratory ARREST Do not perform Intubation, CPR, defibrillation or ACLS   In the event of cardiac or respiratory ARREST Use medication by any route, position, wound care, and other measures to relive pain and suffering. May use oxygen, suction and manual treatment of airway obstruction as needed for comfort.   Comments Comfort Care per family      01/02/20 2048        Code Status History    Date Active Date Inactive Code Status Order ID Comments User Context   01/02/2020 1658 01/02/2020 2048 DNR [id number]  Erline Hau, MD Inpatient   01/02/2020 1826 01/02/2020 1658 Full Code [id number]  Mariel Aloe, MD Inpatient   11/03/2019 1352 11/06/2019 1749 Full Code [id number]  Harvie Heck, MD ED   Advance Care Planning Activity       Prognosis:   Hours - Days  Discharge Planning:  Anticipated Hospital Death  Care plan was discussed with  IDT  Thank you for allowing the Palliative Medicine Team to assist in the care of this patient.   Time In: 10 Time Out: 10.15 Total Time 15 Prolonged Time Billed No       Greater than 50%  of this time was spent counseling and coordinating care related to the above assessment and plan.  Loistine Chance, MD  Please contact Palliative Medicine Team phone at [phone] for questions and concerns.

## 2020-01-03 NOTE — Progress Notes (Signed)
He is now on comfort care. Is currently on a morphine drip at 5 mg/hr. Appears comfortable. No family present. While in room, he is having longer periods of apnea. Suspect death in hours to days. Will continue to follow closely. OK to transfer to floor if ICU bed is needed.  Domingo Mend, MD [facility] [phone]

## 2020-01-03 NOTE — Progress Notes (Signed)
The chart has been reviewed. Given plans for comfort care in the setting of rapid clinical decline we will cancel remaining radiotherapy sessions.     Carola Rhine, PAC

## 2020-01-04 ENCOUNTER — Ambulatory Visit: Payer: Managed Care, Other (non HMO)

## 2020-01-07 ENCOUNTER — Inpatient Hospital Stay: Payer: Managed Care, Other (non HMO)

## 2020-01-07 ENCOUNTER — Ambulatory Visit: Payer: Managed Care, Other (non HMO)

## 2020-01-08 ENCOUNTER — Ambulatory Visit: Payer: Managed Care, Other (non HMO)

## 2020-01-09 ENCOUNTER — Ambulatory Visit: Payer: Managed Care, Other (non HMO)

## 2020-01-11 LAB — CHOLESTEROL, BODY FLUID

## 2020-01-14 NOTE — Progress Notes (Signed)
Notified fiance [name] of patient's passing at 05:25.

## 2020-01-14 NOTE — Discharge Summary (Signed)
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  He presented from his oncologist office on 2/3 due to severe deconditioning and generalized weakness.  He had been in the ICU since 2/9 due to hypotension, sepsis with strep viridans bacteremia.  He continued to have significant cytopenias.  ID was involved with his care and he was on broad-spectrum antibiotics initially with only cefepime but subsequently Flagyl and vancomycin were added due to persistent temperature.  Repeat blood cultures were negative.  He also developed significant large bilateral pleural effusions, he had a right thoracentesis on 2/11 with 1.8 L removed, he also had a left thoracentesis on 2/12 with 800 cc removed.  He was seen by physical therapy who made a recommendation for CIR however that was placed on hold given his current medical situation.  Unfortunately on 2/17 his mental status was noted to deteriorate, he had significant signs of pain.  After lengthy discussions with patient's fianc, decision was made to transition to full comfort care.  He was started on a morphine drip and he subsequently expired on 2020/01/29 at 5:25 AM.  Causes of death Strep viridans bacteremia with sepsis Metastatic squamous cell carcinoma to bone with unknown primary Pancytopenia induced by chemotherapy Large bilateral pleural effusions Generalized weakness and deconditioning  Domingo Mend, MD [facility] [phone]

## 2020-01-14 DEATH — deceased

## 2020-01-16 ENCOUNTER — Other Ambulatory Visit: Payer: Managed Care, Other (non HMO)

## 2020-01-22 NOTE — Progress Notes (Signed)
  Radiation Oncology         [phone] ________________________________  Name: Kenneth Khan. MRN: [id number]  Date: 12/07/2019  DOB: [birth year]  SIMULATION AND TREATMENT PLANNING NOTE  DIAGNOSIS:     ICD-10-CM   1. 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  T12 spine 2.  Left femur 3.  Left rib #5  NARRATIVE:  The patient was brought to the [place].  Identity was confirmed.  All relevant records and images related to the planned course of therapy were reviewed.   Written consent to proceed with treatment was confirmed which was freely given after reviewing the details related to the planned course of therapy had been reviewed with the patient.  Then, the patient was set-up in a stable reproducible  supine position for radiation therapy.  CT images were obtained.  Surface markings were placed.    Medically necessary complex treatment device(s) for immobilization:   1.  Vac-lock bag 2.  accuform device.   The CT images were loaded into the planning software.  Then the target and avoidance structures were contoured.  Treatment planning then occurred.  The radiation prescription was entered and confirmed.  A total of 7 complex treatment devices were fabricated which relate to the designed radiation treatment fields. Each of these customized fields/ complex treatment devices will be used on a daily basis during the radiation course. I have requested : Isodose Plan.   PLAN:  The patient will receive 37.5 Gy in 15 fractions.  ________________________________   Jodelle Gross, MD, PhD

## 2020-01-23 ENCOUNTER — Other Ambulatory Visit: Payer: Managed Care, Other (non HMO)

## 2020-01-28 ENCOUNTER — Ambulatory Visit: Payer: Managed Care, Other (non HMO)

## 2020-01-28 ENCOUNTER — Other Ambulatory Visit: Payer: Managed Care, Other (non HMO)

## 2020-01-30 ENCOUNTER — Ambulatory Visit: Payer: Managed Care, Other (non HMO)

## 2020-02-18 ENCOUNTER — Other Ambulatory Visit: Payer: Managed Care, Other (non HMO)

## 2020-02-18 ENCOUNTER — Ambulatory Visit: Payer: Managed Care, Other (non HMO)

## 2020-03-27 NOTE — Progress Notes (Signed)
  Radiation Oncology         [phone]) [phone] ________________________________  Name: Kenneth Khan. MRN: [id number]  Date: 12/28/2019  DOB: 22-Jan-1966  End of Treatment Note  Diagnosis:   Bone metastasis     Indication for treatment::  palliative       Radiation treatment dates:   12/10/2019 through 12/28/2019  Site/dose:    1.  The proximal left femur was planned to receive 37.5 Gray in 15 fractions using a 3 field isodose plan 2.  The lower T-spine/upper L-spine was planned to receive 37.5 Gray in 15 fractions using a 2 field isodose plan 3.  The left chest was planned to receive 37.5 Gray in 15 fractions using a 2 field isodose plan  Narrative: The patient tolerated radiation treatment relatively well but the patient's overall status declined during his course of radiation treatment.  Ultimately the patient's treatment was discontinued after 27.5 Gray.  The patient was proceeding with supportive care at that time with no further plans for radiation treatment.  Plan: The patient has completed radiation treatment. The patient will return to radiation oncology clinic for routine followup in one month as necessary. I advised the patient to call or return sooner if they have any questions or concerns related to their recovery or treatment. ________________________________  Jodelle Gross, M.D., Ph.D.

## 2020-11-23 IMAGING — DX DG ABD PORTABLE 1V
1 series · 1 of 1 positions shown · non-contrast
Comparison: December 21, 2019.

CLINICAL DATA: Abdominal distension.

EXAM:
PORTABLE ABDOMEN - 1 VIEW

[abdomen kub]
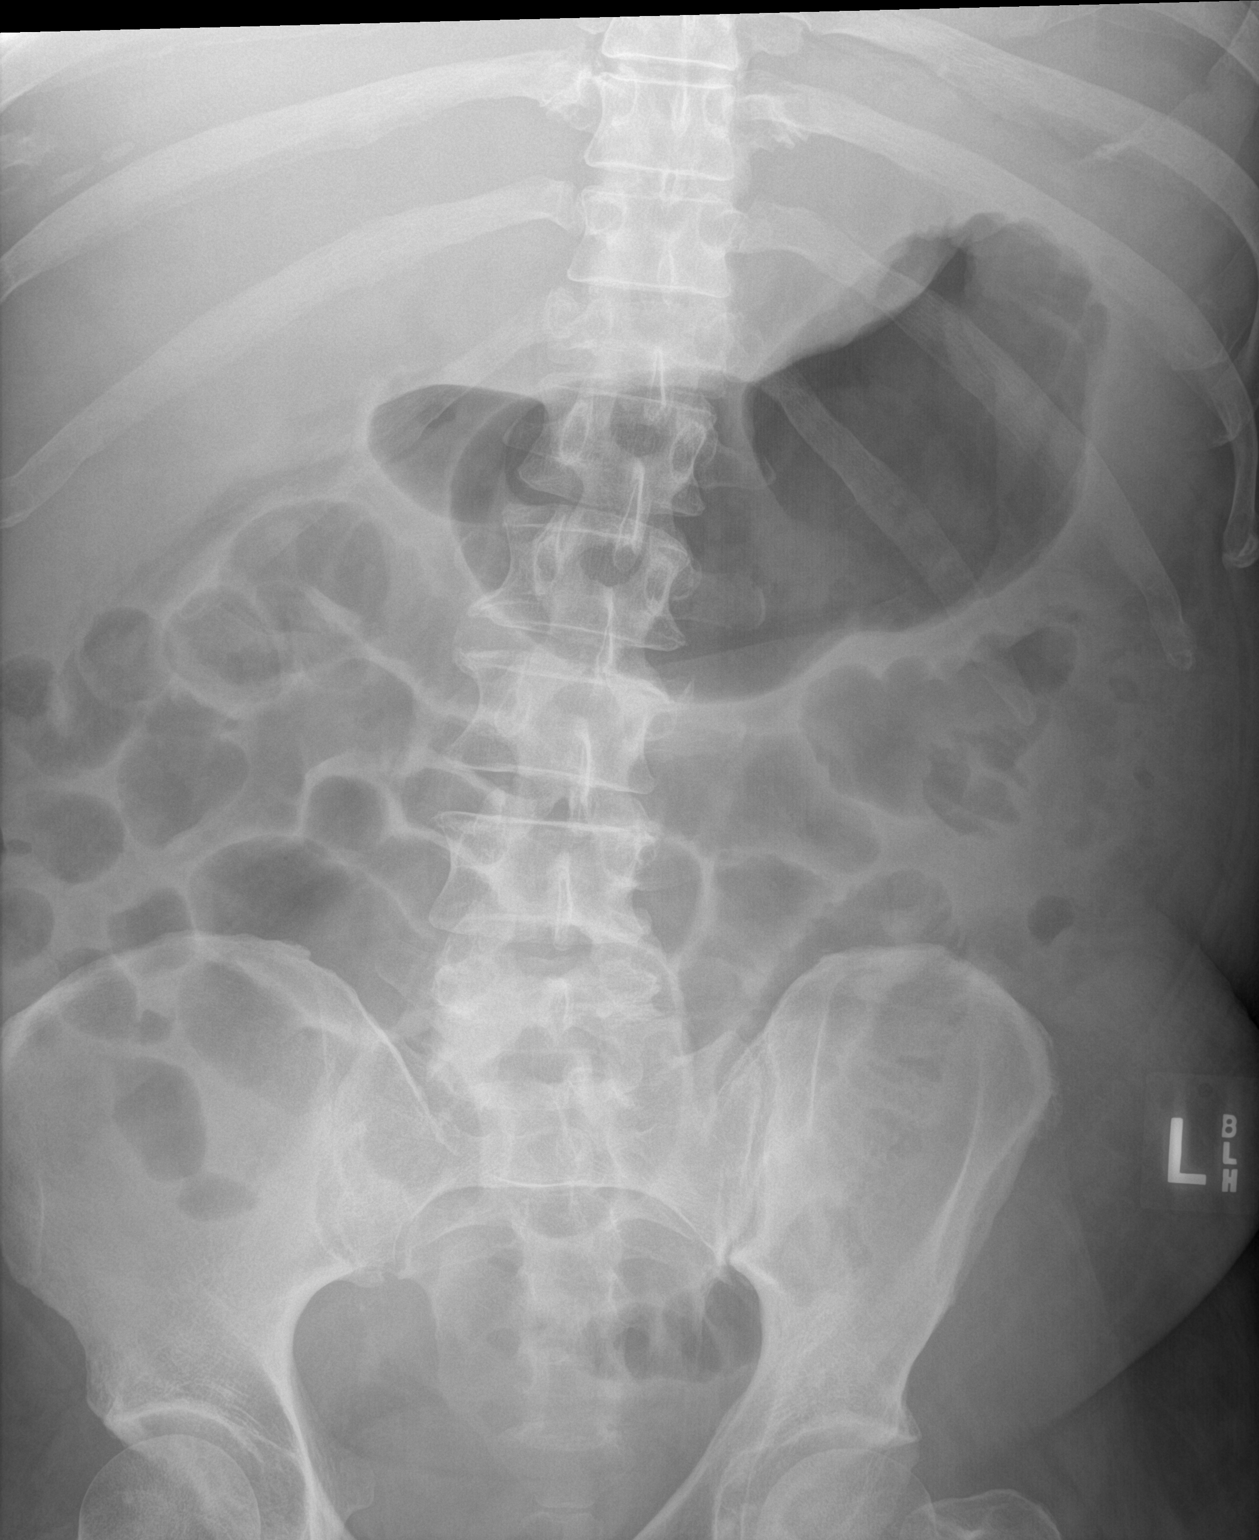

[1 of 1 positions shown; findings below may reference images not displayed]

FINDINGS: No colonic dilatation is noted. Stable mild small bowel dilatation
is noted most consistent with ileus. No radio-opaque calculi or
other significant radiographic abnormality are seen.
IMPRESSION: Stable mild small bowel dilatation is noted most consistent with
ileus.

## 2020-11-25 IMAGING — DX DG ABD PORTABLE 1V
2 series · 2 of 2 positions shown · non-contrast
Comparison: Portable exam 7923 hours compared to 12/23/2019

CLINICAL DATA: Ileus, nausea, vomiting, hypertension, metastatic
cancer

EXAM:
PORTABLE ABDOMEN - 1 VIEW

[abdomen kub (1 of 2)]
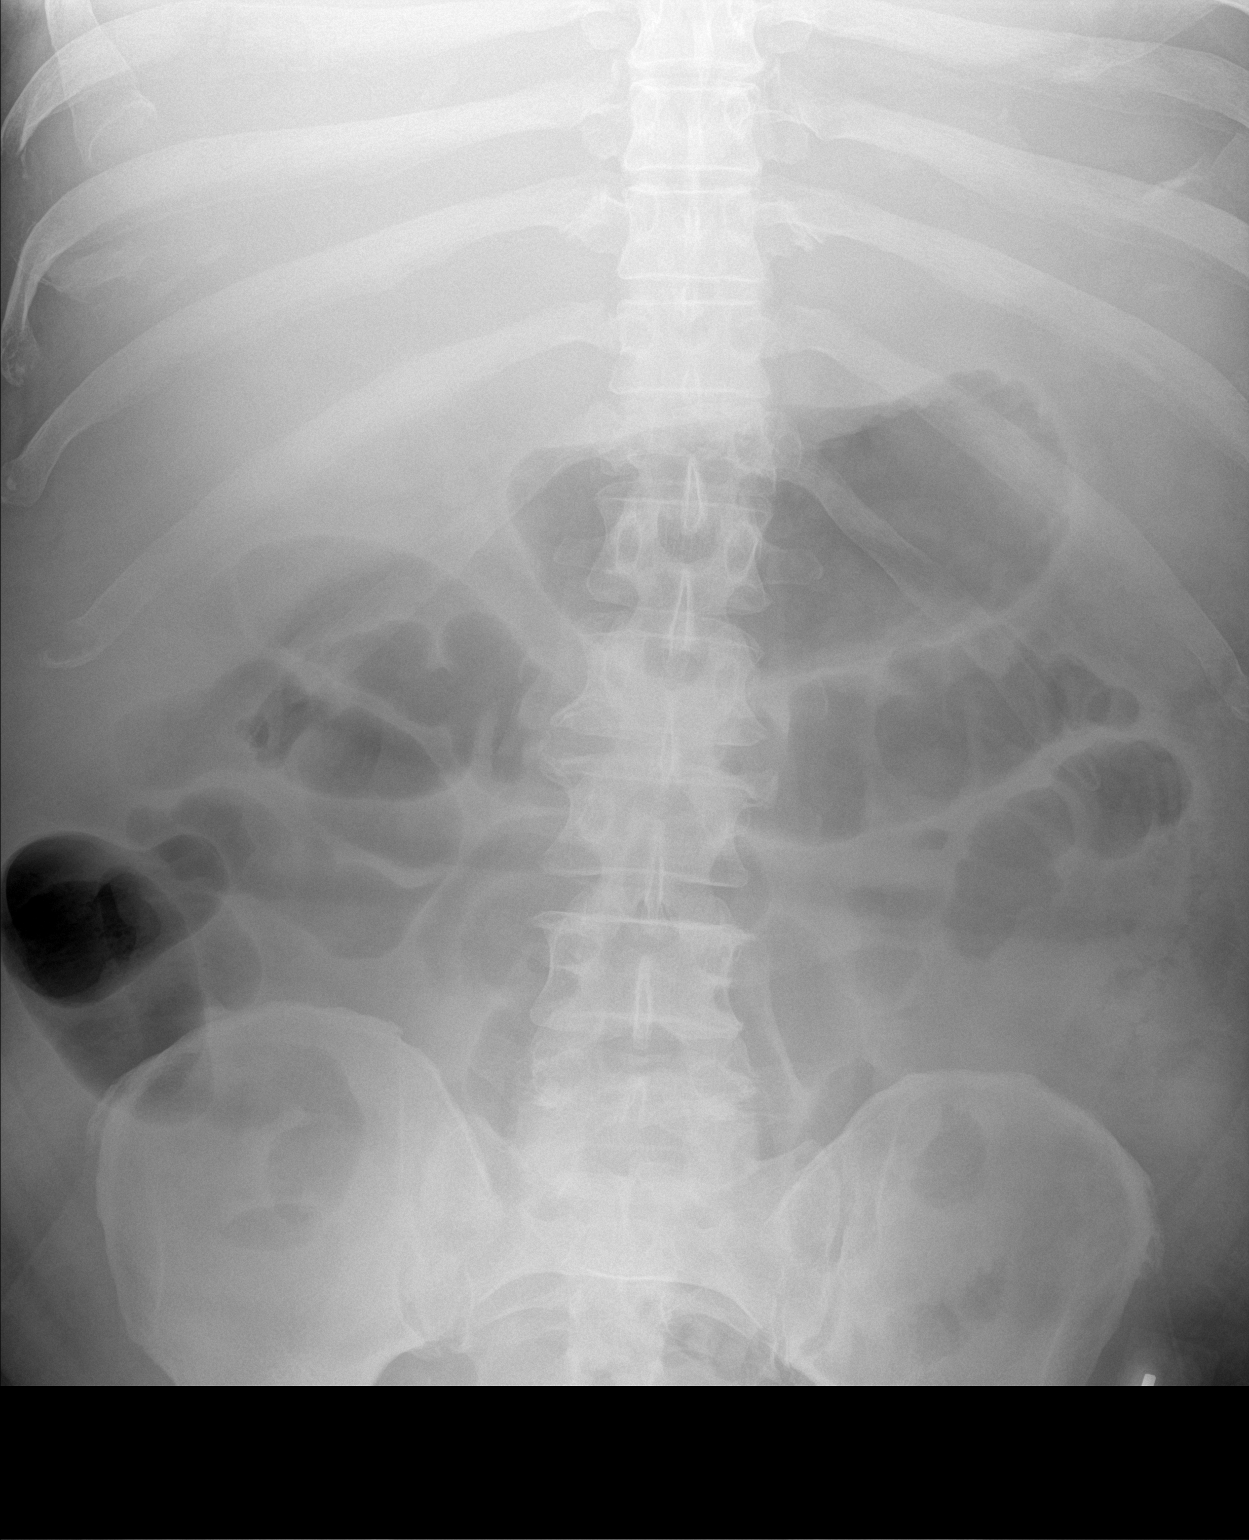

[abdomen kub (2 of 2)]
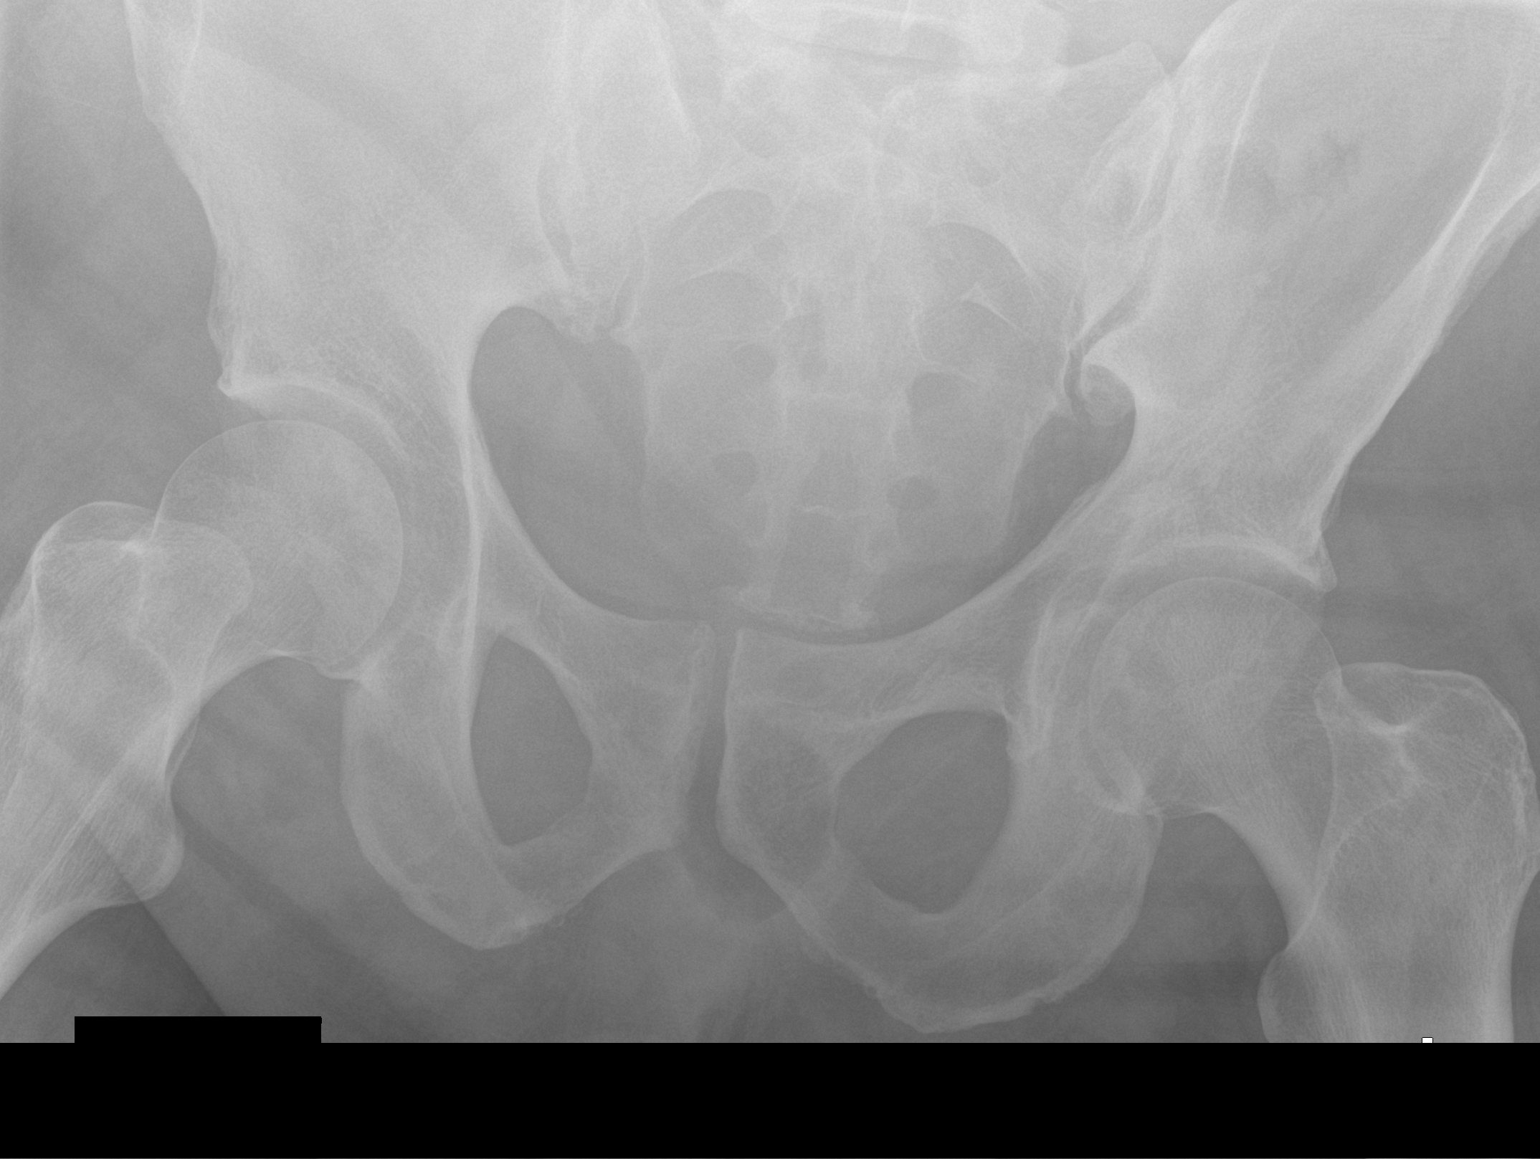

[2 of 2 positions shown; findings below may reference images not displayed]

FINDINGS: Scattered gas in colon.

Few air-filled distended small bowel loops in mid abdomen.

No bowel wall thickening or definite obstruction.

Small amount of gas in rectum.

Osseous structures unremarkable.
IMPRESSION: Few air-filled distended small bowel loops in the mid abdomen
question ileus, little changed.

## 2020-11-26 IMAGING — DX DG CHEST 1V PORT
1 series · 1 of 1 positions shown · non-contrast
Comparison: PET-CT 12/04/2019. CT chest 11/03/2019. Chest x-ray
11/03/2019.

CLINICAL DATA: 53-year-old with current history of hypertension,
metastatic pancreatic cancer, presenting with acute onset of
shortness of breath.

EXAM:
PORTABLE CHEST 1 VIEW

[chest ap]
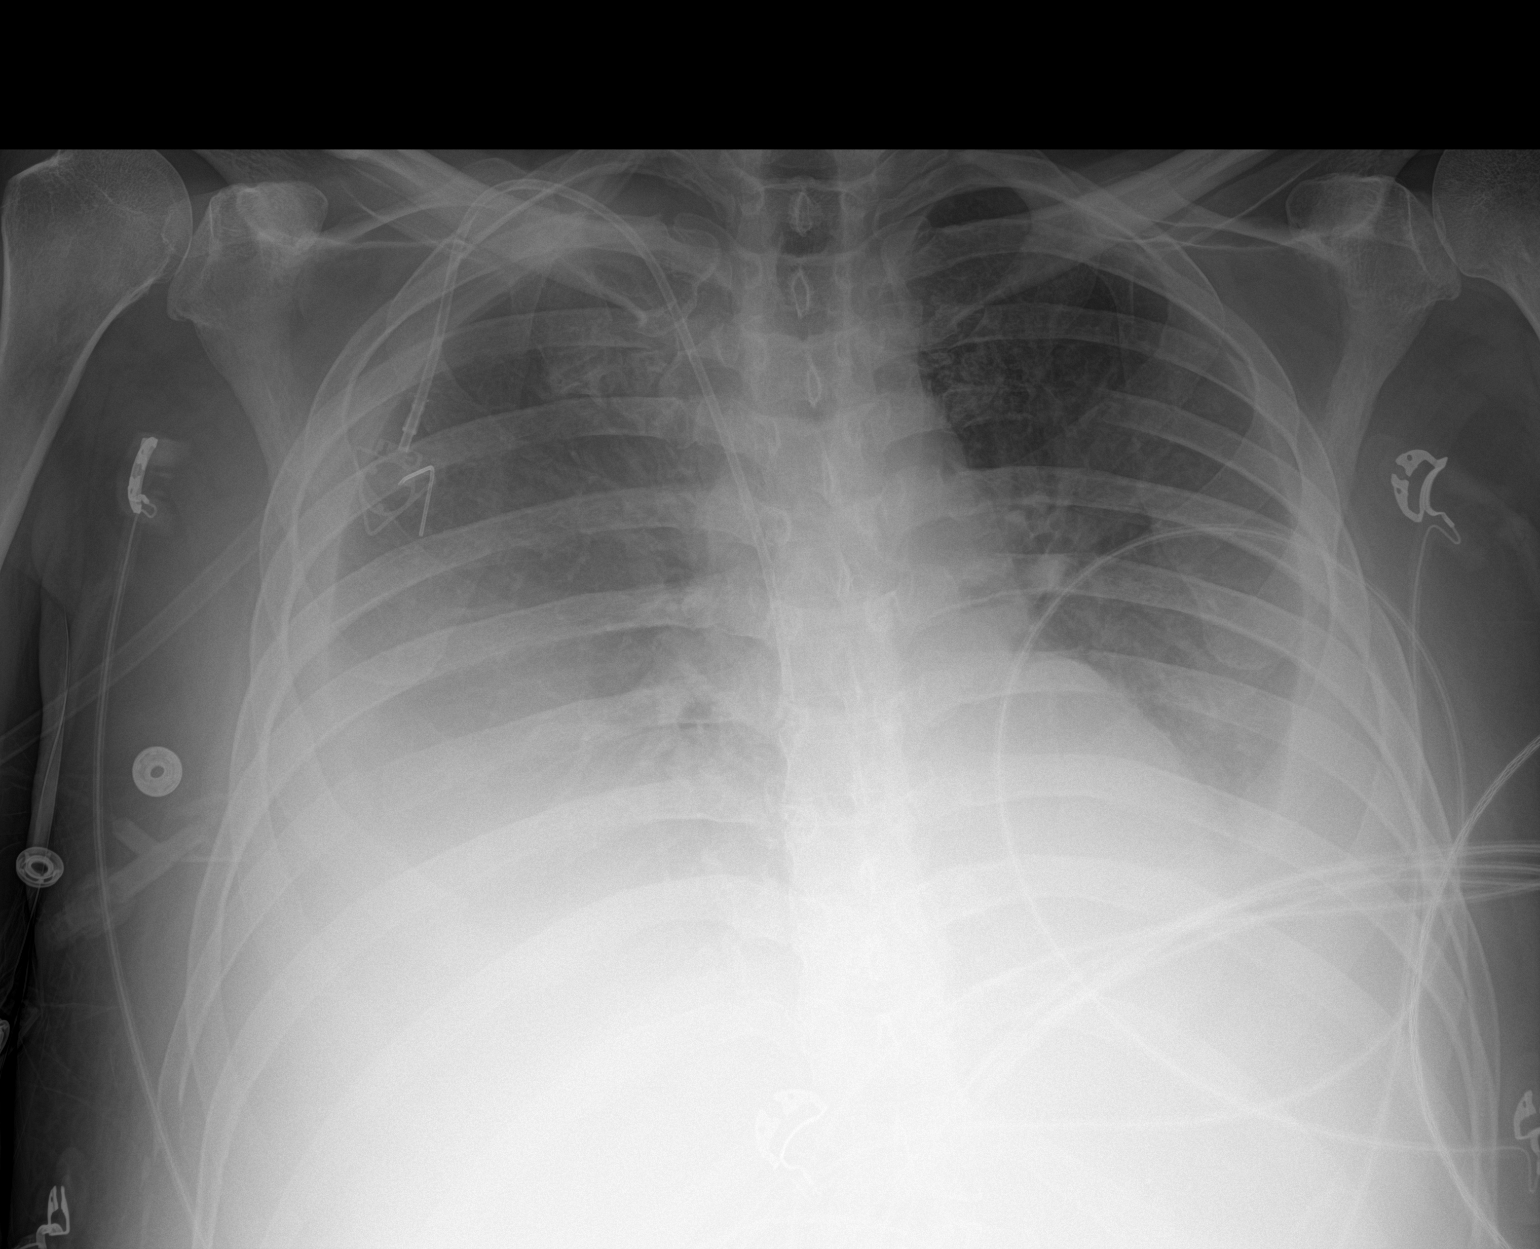

[1 of 1 positions shown; findings below may reference images not displayed]

FINDINGS: Since the prior examinations, interval development of a large RIGHT
pleural effusion and a moderate to large LEFT pleural effusion with
associated consolidation in the lower lobes. Lungs otherwise clear.
Cardiac silhouette normal in size, unchanged. Pulmonary vascularity
normal. RIGHT jugular Port-A-Cath tip projects over the LOWER SVC.
IMPRESSION: Large RIGHT pleural effusion and moderate to large LEFT pleural
effusion with associated passive atelectasis and/or pneumonia
involving the lower lobes.

## 2020-12-02 IMAGING — DX DG CHEST 1V PORT
1 series · 1 of 1 positions shown · non-contrast
Comparison: 12/29/2019

CLINICAL DATA: Shortness of breath

EXAM:
PORTABLE CHEST 1 VIEW

[chest ap]
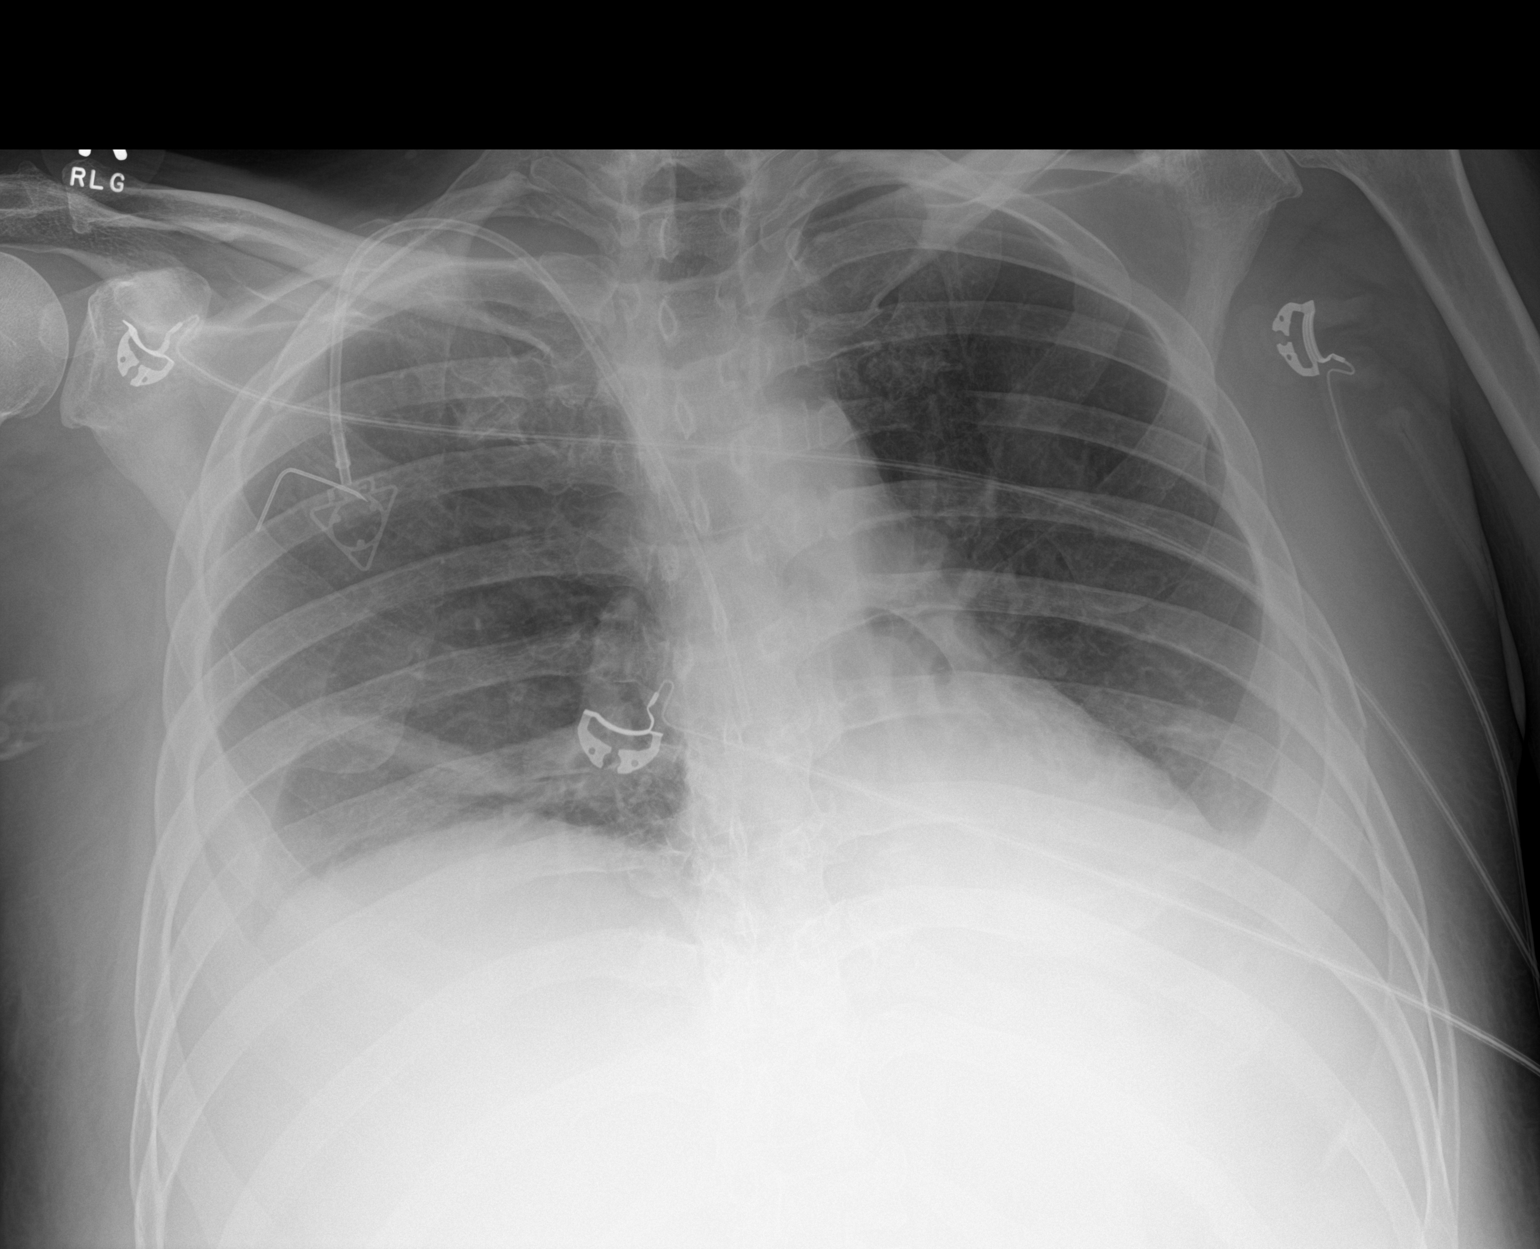

[1 of 1 positions shown; findings below may reference images not displayed]

FINDINGS: Cardiac shadow is stable. Right-sided chest wall port is again seen
and stable. Small pleural effusions are again noted bilaterally. No
bony abnormality is noted.
IMPRESSION: Stable bilateral pleural effusions.
